# Patient Record
Sex: Male | Born: 1935 | Race: Black or African American | Hispanic: No | Smoking: Former smoker
Health system: Southern US, Community
[De-identification: ages and names within clinical notes are randomized; demographics above are authoritative.]

## PROBLEM LIST (undated history)

## (undated) DIAGNOSIS — H9192 Unspecified hearing loss, left ear: Secondary | ICD-10-CM

## (undated) DIAGNOSIS — E7800 Pure hypercholesterolemia, unspecified: Secondary | ICD-10-CM

## (undated) DIAGNOSIS — I739 Peripheral vascular disease, unspecified: Secondary | ICD-10-CM

## (undated) DIAGNOSIS — N182 Chronic kidney disease, stage 2 (mild): Secondary | ICD-10-CM

## (undated) DIAGNOSIS — C61 Malignant neoplasm of prostate: Secondary | ICD-10-CM

## (undated) DIAGNOSIS — I10 Essential (primary) hypertension: Secondary | ICD-10-CM

---

## 1989-04-30 HISTORY — PX: TRANSURETHRAL RESECTION OF PROSTATE: SHX73

## 1997-11-29 ENCOUNTER — Encounter: Admission: RE | Admit: 1997-11-29 | Discharge: 1998-02-27 | Payer: Self-pay | Admitting: Radiation Oncology

## 1998-03-11 ENCOUNTER — Ambulatory Visit (HOSPITAL_COMMUNITY): Admission: RE | Admit: 1998-03-11 | Discharge: 1998-03-11 | Payer: Self-pay | Admitting: Urology

## 1998-04-01 ENCOUNTER — Ambulatory Visit (HOSPITAL_COMMUNITY): Admission: RE | Admit: 1998-04-01 | Discharge: 1998-04-01 | Payer: Self-pay | Admitting: Radiation Oncology

## 1998-04-16 ENCOUNTER — Encounter: Admission: RE | Admit: 1998-04-16 | Discharge: 1998-07-15 | Payer: Self-pay | Admitting: Radiation Oncology

## 1999-05-06 ENCOUNTER — Ambulatory Visit (HOSPITAL_COMMUNITY): Admission: RE | Admit: 1999-05-06 | Discharge: 1999-05-06 | Payer: Self-pay | Admitting: Gastroenterology

## 1999-10-19 ENCOUNTER — Other Ambulatory Visit: Admission: RE | Admit: 1999-10-19 | Discharge: 1999-10-19 | Payer: Self-pay | Admitting: Urology

## 2000-06-17 ENCOUNTER — Ambulatory Visit (HOSPITAL_COMMUNITY): Admission: RE | Admit: 2000-06-17 | Discharge: 2000-06-17 | Payer: Self-pay | Admitting: Gastroenterology

## 2015-01-29 HISTORY — PX: MULTIPLE TOOTH EXTRACTIONS: SHX2053

## 2015-03-27 ENCOUNTER — Encounter (HOSPITAL_COMMUNITY): Payer: Self-pay | Admitting: Emergency Medicine

## 2015-03-27 ENCOUNTER — Inpatient Hospital Stay (HOSPITAL_COMMUNITY)
Admission: EM | Admit: 2015-03-27 | Discharge: 2015-04-04 | DRG: 374 | Disposition: A | Discharge status: Skilled Nursing Facility | Payer: Medicare Other | Attending: Internal Medicine | Admitting: Internal Medicine

## 2015-03-27 ENCOUNTER — Emergency Department (HOSPITAL_COMMUNITY): Payer: Medicare Other

## 2015-03-27 DIAGNOSIS — C169 Malignant neoplasm of stomach, unspecified: Secondary | ICD-10-CM | POA: Diagnosis not present

## 2015-03-27 DIAGNOSIS — N289 Disorder of kidney and ureter, unspecified: Secondary | ICD-10-CM | POA: Diagnosis not present

## 2015-03-27 DIAGNOSIS — R0602 Shortness of breath: Secondary | ICD-10-CM

## 2015-03-27 DIAGNOSIS — Z79899 Other long term (current) drug therapy: Secondary | ICD-10-CM | POA: Diagnosis not present

## 2015-03-27 DIAGNOSIS — R6251 Failure to thrive (child): Secondary | ICD-10-CM | POA: Diagnosis present

## 2015-03-27 DIAGNOSIS — I739 Peripheral vascular disease, unspecified: Secondary | ICD-10-CM | POA: Diagnosis present

## 2015-03-27 DIAGNOSIS — R410 Disorientation, unspecified: Secondary | ICD-10-CM

## 2015-03-27 DIAGNOSIS — I959 Hypotension, unspecified: Secondary | ICD-10-CM | POA: Diagnosis present

## 2015-03-27 DIAGNOSIS — F1010 Alcohol abuse, uncomplicated: Secondary | ICD-10-CM | POA: Diagnosis present

## 2015-03-27 DIAGNOSIS — Z87891 Personal history of nicotine dependence: Secondary | ICD-10-CM

## 2015-03-27 DIAGNOSIS — E785 Hyperlipidemia, unspecified: Secondary | ICD-10-CM | POA: Insufficient documentation

## 2015-03-27 DIAGNOSIS — D630 Anemia in neoplastic disease: Secondary | ICD-10-CM | POA: Diagnosis not present

## 2015-03-27 DIAGNOSIS — E440 Moderate protein-calorie malnutrition: Secondary | ICD-10-CM | POA: Diagnosis present

## 2015-03-27 DIAGNOSIS — E872 Acidosis, unspecified: Secondary | ICD-10-CM | POA: Diagnosis present

## 2015-03-27 DIAGNOSIS — K922 Gastrointestinal hemorrhage, unspecified: Secondary | ICD-10-CM | POA: Diagnosis not present

## 2015-03-27 DIAGNOSIS — E669 Obesity, unspecified: Secondary | ICD-10-CM | POA: Insufficient documentation

## 2015-03-27 DIAGNOSIS — C160 Malignant neoplasm of cardia: Secondary | ICD-10-CM | POA: Diagnosis present

## 2015-03-27 DIAGNOSIS — N183 Chronic kidney disease, stage 3 (moderate): Secondary | ICD-10-CM | POA: Diagnosis present

## 2015-03-27 DIAGNOSIS — E1151 Type 2 diabetes mellitus with diabetic peripheral angiopathy without gangrene: Secondary | ICD-10-CM | POA: Insufficient documentation

## 2015-03-27 DIAGNOSIS — D500 Iron deficiency anemia secondary to blood loss (chronic): Secondary | ICD-10-CM | POA: Diagnosis not present

## 2015-03-27 DIAGNOSIS — I9589 Other hypotension: Secondary | ICD-10-CM | POA: Diagnosis not present

## 2015-03-27 DIAGNOSIS — I129 Hypertensive chronic kidney disease with stage 1 through stage 4 chronic kidney disease, or unspecified chronic kidney disease: Secondary | ICD-10-CM | POA: Diagnosis present

## 2015-03-27 DIAGNOSIS — R627 Adult failure to thrive: Secondary | ICD-10-CM | POA: Diagnosis present

## 2015-03-27 DIAGNOSIS — G9341 Metabolic encephalopathy: Secondary | ICD-10-CM | POA: Diagnosis present

## 2015-03-27 DIAGNOSIS — M79609 Pain in unspecified limb: Secondary | ICD-10-CM | POA: Diagnosis not present

## 2015-03-27 DIAGNOSIS — D649 Anemia, unspecified: Secondary | ICD-10-CM | POA: Diagnosis not present

## 2015-03-27 DIAGNOSIS — N179 Acute kidney failure, unspecified: Secondary | ICD-10-CM | POA: Diagnosis present

## 2015-03-27 DIAGNOSIS — Z510 Encounter for antineoplastic radiation therapy: Secondary | ICD-10-CM | POA: Diagnosis present

## 2015-03-27 DIAGNOSIS — R7881 Bacteremia: Secondary | ICD-10-CM | POA: Diagnosis not present

## 2015-03-27 DIAGNOSIS — Z6826 Body mass index (BMI) 26.0-26.9, adult: Secondary | ICD-10-CM | POA: Diagnosis not present

## 2015-03-27 DIAGNOSIS — D62 Acute posthemorrhagic anemia: Secondary | ICD-10-CM | POA: Diagnosis present

## 2015-03-27 DIAGNOSIS — N182 Chronic kidney disease, stage 2 (mild): Secondary | ICD-10-CM | POA: Insufficient documentation

## 2015-03-27 HISTORY — DX: Pure hypercholesterolemia, unspecified: E78.00

## 2015-03-27 HISTORY — DX: Essential (primary) hypertension: I10

## 2015-03-27 HISTORY — DX: Chronic kidney disease, stage 2 (mild): N18.2

## 2015-03-27 HISTORY — DX: Unspecified hearing loss, left ear: H91.92

## 2015-03-27 HISTORY — DX: Peripheral vascular disease, unspecified: I73.9

## 2015-03-27 HISTORY — DX: Malignant neoplasm of prostate: C61

## 2015-03-27 LAB — CBC WITH DIFFERENTIAL/PLATELET
Basophils Absolute: 0 10*3/uL (ref 0.0–0.1)
Basophils Relative: 0 % (ref 0–1)
Eosinophils Absolute: 0 10*3/uL (ref 0.0–0.7)
Eosinophils Relative: 0 % (ref 0–5)
HCT: 27.3 % — ABNORMAL LOW (ref 39.0–52.0)
Hemoglobin: 8.8 g/dL — ABNORMAL LOW (ref 13.0–17.0)
LYMPHS PCT: 14 % (ref 12–46)
Lymphs Abs: 1 10*3/uL (ref 0.7–4.0)
MCH: 29 pg (ref 26.0–34.0)
MCHC: 32.2 g/dL (ref 30.0–36.0)
MCV: 90.1 fL (ref 78.0–100.0)
MONO ABS: 0.5 10*3/uL (ref 0.1–1.0)
Monocytes Relative: 6 % (ref 3–12)
NEUTROS ABS: 6 10*3/uL (ref 1.7–7.7)
Neutrophils Relative %: 80 % — ABNORMAL HIGH (ref 43–77)
PLATELETS: 360 10*3/uL (ref 150–400)
RBC: 3.03 MIL/uL — AB (ref 4.22–5.81)
RDW: 14.2 % (ref 11.5–15.5)
WBC: 7.5 10*3/uL (ref 4.0–10.5)

## 2015-03-27 LAB — LIPASE, BLOOD: LIPASE: 71 U/L — AB (ref 22–51)

## 2015-03-27 LAB — COMPREHENSIVE METABOLIC PANEL
ALBUMIN: 3 g/dL — AB (ref 3.5–5.0)
ALT: 10 U/L — ABNORMAL LOW (ref 17–63)
AST: 19 U/L (ref 15–41)
Alkaline Phosphatase: 52 U/L (ref 38–126)
Anion gap: 9 (ref 5–15)
BUN: 55 mg/dL — ABNORMAL HIGH (ref 6–20)
CALCIUM: 9.3 mg/dL (ref 8.9–10.3)
CO2: 19 mmol/L — AB (ref 22–32)
Chloride: 110 mmol/L (ref 101–111)
Creatinine, Ser: 3.12 mg/dL — ABNORMAL HIGH (ref 0.61–1.24)
GFR, EST AFRICAN AMERICAN: 20 mL/min — AB (ref >60)
GFR, EST NON AFRICAN AMERICAN: 17 mL/min — AB (ref >60)
Glucose, Bld: 127 mg/dL — ABNORMAL HIGH (ref 65–99)
POTASSIUM: 5.1 mmol/L (ref 3.5–5.1)
Sodium: 138 mmol/L (ref 135–145)
Total Bilirubin: 0.5 mg/dL (ref 0.3–1.2)
Total Protein: 5.7 g/dL — ABNORMAL LOW (ref 6.5–8.1)

## 2015-03-27 LAB — I-STAT CHEM 8, ED
BUN: 56 mg/dL — ABNORMAL HIGH (ref 6–20)
CHLORIDE: 107 mmol/L (ref 101–111)
CREATININE: 3 mg/dL — AB (ref 0.61–1.24)
Calcium, Ion: 1.18 mmol/L (ref 1.13–1.30)
GLUCOSE: 124 mg/dL — AB (ref 65–99)
HCT: 29 % — ABNORMAL LOW (ref 39.0–52.0)
HEMOGLOBIN: 9.9 g/dL — AB (ref 13.0–17.0)
Potassium: 4.9 mmol/L (ref 3.5–5.1)
SODIUM: 137 mmol/L (ref 135–145)
TCO2: 16 mmol/L (ref 0–100)

## 2015-03-27 LAB — PROTIME-INR
INR: 1.02 (ref 0.00–1.49)
PROTHROMBIN TIME: 13.6 s (ref 11.6–15.2)

## 2015-03-27 LAB — ABO/RH: ABO/RH(D): B POS

## 2015-03-27 LAB — I-STAT CG4 LACTIC ACID, ED: Lactic Acid, Venous: 2.53 mmol/L (ref 0.5–2.0)

## 2015-03-27 LAB — LACTIC ACID, PLASMA
LACTIC ACID, VENOUS: 1.1 mmol/L (ref 0.5–2.0)
LACTIC ACID, VENOUS: 2 mmol/L (ref 0.5–2.0)

## 2015-03-27 LAB — I-STAT TROPONIN, ED: Troponin i, poc: 0.02 ng/mL (ref 0.00–0.08)

## 2015-03-27 MED ORDER — ASPIRIN 300 MG RE SUPP
300.0000 mg | RECTAL | Status: AC
  Filled 2015-03-27: qty 1

## 2015-03-27 MED ORDER — LORAZEPAM 1 MG PO TABS: 1.0000 mg | ORAL_TABLET | ORAL | Status: DC | PRN

## 2015-03-27 MED ORDER — ENSURE ENLIVE PO LIQD
237.0000 mL | Freq: Two times a day (BID) | ORAL | Status: DC
  Administered 2015-03-27 – 2015-04-03 (×12): 237 mL via ORAL

## 2015-03-27 MED ORDER — ATORVASTATIN CALCIUM 20 MG PO TABS
20.0000 mg | ORAL_TABLET | Freq: Every day | ORAL | Status: DC
  Administered 2015-03-28 – 2015-04-03 (×7): 20 mg via ORAL
  Filled 2015-03-27 (×7): qty 1

## 2015-03-27 MED ORDER — FESOTERODINE FUMARATE ER 4 MG PO TB24
4.0000 mg | ORAL_TABLET | Freq: Every day | ORAL | Status: DC
  Administered 2015-03-28 – 2015-04-04 (×8): 4 mg via ORAL
  Filled 2015-03-27 (×9): qty 1

## 2015-03-27 MED ORDER — ENOXAPARIN SODIUM 30 MG/0.3ML ~~LOC~~ SOLN
30.0000 mg | SUBCUTANEOUS | Status: DC
  Administered 2015-03-27: 30 mg via SUBCUTANEOUS
  Filled 2015-03-27: qty 0.3

## 2015-03-27 MED ORDER — CHLORHEXIDINE GLUCONATE 0.12 % MT SOLN
15.0000 mL | Freq: Two times a day (BID) | OROMUCOSAL | Status: DC
  Administered 2015-03-27 – 2015-04-04 (×16): 15 mL via OROMUCOSAL
  Filled 2015-03-27 (×17): qty 15

## 2015-03-27 MED ORDER — LORAZEPAM 2 MG/ML IJ SOLN: 1.0000 mg | INTRAMUSCULAR | Status: DC | PRN

## 2015-03-27 MED ORDER — SODIUM CHLORIDE 0.9 % IV SOLN
INTRAVENOUS | Status: AC
  Administered 2015-03-27: 19:00:00 via INTRAVENOUS

## 2015-03-27 MED ORDER — SODIUM CHLORIDE 0.9 % IV SOLN: 250.0000 mL | INTRAVENOUS | Status: DC | PRN

## 2015-03-27 MED ORDER — ONDANSETRON HCL 4 MG/2ML IJ SOLN: 4.0000 mg | Freq: Four times a day (QID) | INTRAMUSCULAR | Status: DC | PRN

## 2015-03-27 MED ORDER — LORAZEPAM 2 MG/ML IJ SOLN: 2.0000 mg | INTRAMUSCULAR | Status: DC | PRN

## 2015-03-27 MED ORDER — CETYLPYRIDINIUM CHLORIDE 0.05 % MT LIQD
7.0000 mL | Freq: Two times a day (BID) | OROMUCOSAL | Status: DC
  Administered 2015-03-28 – 2015-04-03 (×10): 7 mL via OROMUCOSAL

## 2015-03-27 MED ORDER — SODIUM CHLORIDE 0.9 % IV BOLUS (SEPSIS)
1000.0000 mL | Freq: Once | INTRAVENOUS | Status: AC
  Administered 2015-03-27: 1000 mL via INTRAVENOUS

## 2015-03-27 MED ORDER — ASPIRIN 81 MG PO CHEW
324.0000 mg | CHEWABLE_TABLET | ORAL | Status: AC
  Administered 2015-03-27: 324 mg via ORAL
  Filled 2015-03-27: qty 4

## 2015-03-27 MED ORDER — ACETAMINOPHEN 325 MG PO TABS: 650.0000 mg | ORAL_TABLET | ORAL | Status: DC | PRN

## 2015-03-27 NOTE — H&P (Signed)
History and Physical  Benjamin Lawson Benjamin Lawson DOB: [birth year] DOA: 03/27/2015  Referring physician: Dr Colvin Caroli, ED physician PCP: Dr Bevelyn Buckles  Chief Complaint: Hypotension, failure to thrive  HPI: Benjamin Lawson is a 81 y.o. male  With a history of hypertension, hyperlipidemia, alcohol abuse, and diabetes with peripheral vascular disorder, stage II chronic kidney disease. The patient was sent to the emergency department from his doctor's office due to severe hypotension. The patient has had minimal oral intake of both food and liquid or the past 2-3 weeks due to his lower teeth being extracted. He was instructed to not take his blood pressure medication by his daughter, who is a retired Marine scientist, however the patient continue taking his blood pressure medication. He does admit to having lightheadedness and dizziness, which is worse with standing and ambulating. His lightheadedness and dizziness is improved with rest. He denies syncopal episodes. His symptoms are getting worse. His daughter and son reports mild intermittent confusion.  His doctor did send his last blood work, which took place about one year ago, which showed a creatinine of 1.4 and a hemoglobin of 12.6 and hematocrit of 37.4.  Review of Systems:   Pt complains of diminished appetite.  Pt denies any fevers, chills, nausea, vomiting, diarrhea, constipation, abdominal pain, shortness of breath, chest pain, palpitations, melena, rectal bleeding, headache, blurred vision.  Review of systems are otherwise negative  Past Medical History  Diagnosis Date  . Hypertension   . Hypercholesterolemia    History reviewed. No pertinent past surgical history. Social History:  reports that he has quit smoking. He does not have any smokeless tobacco history on file. He reports that he drinks alcohol. He reports that he does not use illicit drugs. Patient lives at home & is able to participate in activities of daily living    No Known Allergies  Family history of diabetes and hypertension  Prior to Admission medications   Medication Sig Start Date End Date Taking? Authorizing Provider  atorvastatin (LIPITOR) 20 MG tablet Take 20 mg by mouth daily.   Yes Historical Provider, MD  lactose free nutrition (BOOST) LIQD Take 237 mLs by mouth 2 (two) times daily with a meal.   Yes Historical Provider, MD  metoprolol succinate (TOPROL-XL) 50 MG 24 hr tablet Take 50 mg by mouth daily. Take with or immediately following a meal.   Yes Historical Provider, MD  sennosides-docusate sodium (SENOKOT-S) 8.6-50 MG tablet Take 1 tablet by mouth daily.   Yes Historical Provider, MD  telmisartan-hydrochlorothiazide (MICARDIS HCT) 80-12.5 MG per tablet Take 1 tablet by mouth daily.   Yes Historical Provider, MD  tolterodine (DETROL LA) 4 MG 24 hr capsule Take 4 mg by mouth daily.   Yes Historical Provider, MD    Physical Exam: BP 90/38 mmHg  Resp 18  General: Elderly black male. Awake and alert and oriented x3. No acute cardiopulmonary distress.  Eyes: Pupils equal, round, reactive to light. Extraocular muscles are intact. Sclerae anicteric and noninjected.  ENT:  Dry mucosal membranes. No mucosal lesions. Edentulous lower mouth.  Neck: Neck supple without lymphadenopathy. No carotid bruits. No masses palpated.  Cardiovascular: Regular rate with normal S1-S2 sounds. No murmurs, rubs, gallops auscultated. No JVD.  Respiratory: Good respiratory effort with no wheezes, rales, rhonchi. Lungs clear to auscultation bilaterally.  Abdomen: Soft, nontender, nondistended. Active bowel sounds. No masses or hepatosplenomegaly  Skin: Dry, warm to touch. 2+ dorsalis pedis and radial pulses. Severe tenting of the skin, very dry appearing  Musculoskeletal: No calf or leg pain. All major joints not erythematous nontender.  Psychiatric: Intact judgment and insight.  Neurologic: No focal neurological deficits. Cranial nerves II through XII are  grossly intact.           Labs on Admission:  Basic Metabolic Panel:  Recent Labs Lab 03/27/15 1432 03/27/15 1442  NA 137 138  K 4.9 5.1  CL 107 110  CO2  --  19*  GLUCOSE 124* 127*  BUN 56* 55*  CREATININE 3.00* 3.12*  CALCIUM  --  9.3   Liver Function Tests:  Recent Labs Lab 03/27/15 1442  AST 19  ALT 10*  ALKPHOS 52  BILITOT 0.5  PROT 5.7*  ALBUMIN 3.0*    Recent Labs Lab 03/27/15 1442  LIPASE 71*   No results for input(s): AMMONIA in the last 168 hours. CBC:  Recent Labs Lab 03/27/15 1432 03/27/15 1442  WBC  --  7.5  NEUTROABS  --  6.0  HGB 9.9* 8.8*  HCT 29.0* 27.3*  MCV  --  90.1  PLT  --  360   Cardiac Enzymes: No results for input(s): CKTOTAL, CKMB, CKMBINDEX, TROPONINI in the last 168 hours.  BNP (last 3 results) No results for input(s): BNP in the last 8760 hours.  ProBNP (last 3 results) No results for input(s): PROBNP in the last 8760 hours.  CBG: No results for input(s): GLUCAP in the last 168 hours.  Radiological Exams on Admission: Dg Chest Port 1 View  03/27/2015   CLINICAL DATA:  Weakness, fatigue, shortness of breath for 2 days. Ex-smoker  EXAM: PORTABLE CHEST - 1 VIEW  COMPARISON:  None.  FINDINGS: There is marked elevation of the left hemidiaphragm, of uncertain acuity. Lungs appear to be at least mildly hyperexpanded suggesting some degree of COPD. Lungs appear clear. No evidence of pneumonia. No pleural effusion. No pneumothorax.  Osseous structures are unremarkable. Cardiomediastinal silhouette appears normal in size and configuration, although a portion of the left heart border is obscured by the left hemidiaphragm elevation.  IMPRESSION: Lungs appear hyperexpanded suggesting some degree of underlying COPD.  Lungs are clear.  No evidence of acute cardiopulmonary abnormality.  Marked elevation of the left hemidiaphragm, of uncertain acuity but probably chronic.   Electronically Signed   By: Franki Cabot M.D.   On: 03/27/2015  14:55    EKG: Independently reviewed. Sinus tachycardia with ventricular rate of 124.  Abdominal PR and QRS intervals. Slight J-point depression in leads 23 and aVF. No acute ST changes. Borderline prolonged QTC  Assessment/Plan Present on Admission:  . Hypotension . Acute renal failure . Lactic acidosis . Anemia . Alcohol abuse . Failure to thrive in adult  This patient was discussed with the ED physician, including pertinent vitals, physical exam findings, labs, and imaging.  We also discussed care given by the ED provider.  #1 hypotension secondary to failure to thrive  Admit to stepdown due to continued hypotension  Will continue IV fluids and attempt to prevent use of pressors  Continue normal saline at 125 mL per hour  Hold antihypertensives #2 acute renal failure  Continue hydration and fluid resuscitation  Assess creatinine in the morning #3 lactic acidosis   Recheck lactic acid level  Continue hydration #4 anemia  This appears new over the past year  Check stool for occult blood and #5 alcohol abuse  Alcohol withdrawal protocol #6 failure to thrive and adult  Encourage by mouth intake #7 hypertension  Hold antihypertensives  DVT prophylaxis:  Lovenox  Consultants: None  Code Status: Full code  Family Communication: Son in the room, discussed with daughter on the phone   Disposition Plan: Admit to stepdown. Likely return home after improvement of hypotension and renal failure   Truett Mainland, DO [facility] Pager [phone]

## 2015-03-27 NOTE — ED Provider Notes (Signed)
CSN: [id number]     Arrival date & time 03/27/15  1332 History   First MD Initiated Contact with Patient 03/27/15 1416     Chief Complaint  Patient presents with  . Hypotension     (Consider location/radiation/quality/duration/timing/severity/associated sxs/prior Treatment) HPI Patient with 2-3 weeks of decreased oral intake and variable symptoms of mild confusion. The patient reports that he has not been eating because he had to have dental work and thus has not been taking much in. He reports soon he will be getting his lower denture plates and will be able to eat again. His daughter reports that she told him not to take his blood pressure medication with as much weight loss and decreased oral intake as he has been having, she does report however he continued to take it and took it today. The patient denies he's been experiencing any pain. He does not endorse chest pain headache or abdominal pain. He denies vomiting or diarrhea. His daughter reports that he lives independently. She does see him and notes that he has been mildly confused periodically. Confusion consistent with something such as mixing up names of foods or other recall\recognition type of cognition. Patient denies that he's been having any black or bloody stool Past Medical History  Diagnosis Date  . Hypertension   . Hypercholesterolemia    History reviewed. No pertinent past surgical history. No family history on file. History  Substance Use Topics  . Smoking status: Former Research scientist (life sciences)  . Smokeless tobacco: Not on file  . Alcohol Use: Yes    Review of Systems 10 Systems reviewed and are negative for acute change except as noted in the HPI.    Allergies  Review of patient's allergies indicates no known allergies.  Home Medications   Prior to Admission medications   Medication Sig Start Date End Date Taking? Authorizing Provider  atorvastatin (LIPITOR) 20 MG tablet Take 20 mg by mouth daily.   Yes Historical Provider,  MD  lactose free nutrition (BOOST) LIQD Take 237 mLs by mouth 2 (two) times daily with a meal.   Yes Historical Provider, MD  metoprolol succinate (TOPROL-XL) 50 MG 24 hr tablet Take 50 mg by mouth daily. Take with or immediately following a meal.   Yes Historical Provider, MD  sennosides-docusate sodium (SENOKOT-S) 8.6-50 MG tablet Take 1 tablet by mouth daily.   Yes Historical Provider, MD  telmisartan-hydrochlorothiazide (MICARDIS HCT) 80-12.5 MG per tablet Take 1 tablet by mouth daily.   Yes Historical Provider, MD  tolterodine (DETROL LA) 4 MG 24 hr capsule Take 4 mg by mouth daily.   Yes Historical Provider, MD   BP 90/38 mmHg  Resp 18 Physical Exam  Constitutional: He is oriented to person, place, and time. He appears well-developed and well-nourished.  Patient is slender and pale appearing. He is alert with no respiratory distress and clear mental status.  HENT:  Head: Normocephalic and atraumatic.  Right Ear: External ear normal.  Left Ear: External ear normal.  Mouth/Throat: Oropharynx is clear and moist.  Eyes: EOM are normal. Pupils are equal, round, and reactive to light.  Neck: Neck supple.  Cardiovascular: Normal rate, regular rhythm, normal heart sounds and intact distal pulses.   Pulmonary/Chest: Effort normal and breath sounds normal.  Abdominal: Soft. Bowel sounds are normal. He exhibits no distension. There is no tenderness.  Genitourinary:  Soft brown stool in the vault. No melanotic.  Musculoskeletal: Normal range of motion. He exhibits no edema or tenderness.  Neurological: He is  alert and oriented to person, place, and time. He has normal strength. Coordination normal. GCS eye subscore is 4. GCS verbal subscore is 5. GCS motor subscore is 6.  Skin: Skin is warm, dry and intact. There is pallor.  Psychiatric: He has a normal mood and affect.    ED Course  Procedures (including critical care time) Labs Review Labs Reviewed  COMPREHENSIVE METABOLIC PANEL -  Abnormal; Notable for the following:    CO2 19 (*)    Glucose, Bld 127 (*)    BUN 55 (*)    Creatinine, Ser 3.12 (*)    Total Protein 5.7 (*)    Albumin 3.0 (*)    ALT 10 (*)    GFR calc non Af Amer 17 (*)    GFR calc Af Amer 20 (*)    All other components within normal limits  LIPASE, BLOOD - Abnormal; Notable for the following:    Lipase 71 (*)    All other components within normal limits  CBC WITH DIFFERENTIAL/PLATELET - Abnormal; Notable for the following:    RBC 3.03 (*)    Hemoglobin 8.8 (*)    HCT 27.3 (*)    Neutrophils Relative % 80 (*)    All other components within normal limits  I-STAT CHEM 8, ED - Abnormal; Notable for the following:    BUN 56 (*)    Creatinine, Ser 3.00 (*)    Glucose, Bld 124 (*)    Hemoglobin 9.9 (*)    HCT 29.0 (*)    All other components within normal limits  I-STAT CG4 LACTIC ACID, ED - Abnormal; Notable for the following:    Lactic Acid, Venous 2.53 (*)    All other components within normal limits  CULTURE, BLOOD (ROUTINE X 2)  CULTURE, BLOOD (ROUTINE X 2)  PROTIME-INR  URINALYSIS, ROUTINE W REFLEX MICROSCOPIC (NOT AT [facility])  I-STAT TROPOININ, ED  I-STAT TROPOININ, ED  POC OCCULT BLOOD, ED  TYPE AND SCREEN  ABO/RH    Imaging Review Dg Chest Port 1 View  03/27/2015   CLINICAL DATA:  Weakness, fatigue, shortness of breath for 2 days. Ex-smoker  EXAM: PORTABLE CHEST - 1 VIEW  COMPARISON:  None.  FINDINGS: There is marked elevation of the left hemidiaphragm, of uncertain acuity. Lungs appear to be at least mildly hyperexpanded suggesting some degree of COPD. Lungs appear clear. No evidence of pneumonia. No pleural effusion. No pneumothorax.  Osseous structures are unremarkable. Cardiomediastinal silhouette appears normal in size and configuration, although a portion of the left heart border is obscured by the left hemidiaphragm elevation.  IMPRESSION: Lungs appear hyperexpanded suggesting some degree of underlying COPD.  Lungs are clear.  No  evidence of acute cardiopulmonary abnormality.  Marked elevation of the left hemidiaphragm, of uncertain acuity but probably chronic.   Electronically Signed   By: Franki Cabot M.D.   On: 03/27/2015 14:55     EKG Interpretation   Date/Time:  Thursday March 27 2015 14:10:18 EDT Ventricular Rate:  124 PR Interval:  102 QRS Duration: 86 QT Interval:  336 QTC Calculation: 483 R Axis:   -41 Text Interpretation:  Sinus tachycardia Low voltage, extremity leads  Consider RVH or posterior infarct ST depr, consider ischemia, inferior  leads Borderline prolonged QT interval agree. Confirmed by Johnney Killian, MD,  Jeannie Done [phone]) on 03/27/2015 3:30:36 PM     CRITICAL CARE Performed by: Charlesetta Shanks   Total critical care time: 45  Critical care time was exclusive of separately billable procedures and  treating other patients.  Critical care was necessary to treat or prevent imminent or life-threatening deterioration.  Critical care was time spent personally by me on the following activities: development of treatment plan with patient and/or surrogate as well as nursing, discussions with consultants, evaluation of patient's response to treatment, examination of patient, obtaining history from patient or surrogate, ordering and performing treatments and interventions, ordering and review of laboratory studies, ordering and review of radiographic studies, pulse oximetry and re-evaluation of patient's condition. MDM   Final diagnoses:  Hypotension, unspecified hypotension type  Acute kidney injury  Failure to thrive (0-17)   Patient arrives initially hypotension with acute renal failure. This appears to be due to decreased oral intake. The patient was hypotensive and near syncopal. Mental status is clear. Respiratory status is stable. At this time the patient will be admitted for ongoing treatment of hypotension with renal insufficiency. He does not appear to be septic at this time. Patient is anemic  however does not appear to have acute active bleeding with stool that is brown and non-melanotic.    Charlesetta Shanks, MD 03/27/15 [phone]

## 2015-03-27 NOTE — ED Notes (Signed)
Pt./dsughter stated, hes been dizzy and not eating, and hes not eating.  I brought him because of low BP.

## 2015-03-28 ENCOUNTER — Encounter (HOSPITAL_COMMUNITY)
Admission: EM | Disposition: A | Discharge status: Skilled Nursing Facility | Payer: Self-pay | Source: Home / Self Care | Attending: Internal Medicine

## 2015-03-28 ENCOUNTER — Encounter (HOSPITAL_COMMUNITY): Payer: Self-pay

## 2015-03-28 DIAGNOSIS — N179 Acute kidney failure, unspecified: Secondary | ICD-10-CM

## 2015-03-28 DIAGNOSIS — E440 Moderate protein-calorie malnutrition: Secondary | ICD-10-CM | POA: Insufficient documentation

## 2015-03-28 DIAGNOSIS — D62 Acute posthemorrhagic anemia: Secondary | ICD-10-CM

## 2015-03-28 DIAGNOSIS — R7881 Bacteremia: Secondary | ICD-10-CM

## 2015-03-28 HISTORY — PX: ESOPHAGOGASTRODUODENOSCOPY: SHX5428

## 2015-03-28 LAB — PROTIME-INR
INR: 1.08 (ref 0.00–1.49)
PROTHROMBIN TIME: 14.2 s (ref 11.6–15.2)

## 2015-03-28 LAB — CBC
HEMATOCRIT: 21.9 % — AB (ref 39.0–52.0)
HEMATOCRIT: 23.5 % — AB (ref 39.0–52.0)
HEMOGLOBIN: 7 g/dL — AB (ref 13.0–17.0)
Hemoglobin: 7.4 g/dL — ABNORMAL LOW (ref 13.0–17.0)
MCH: 28.8 pg (ref 26.0–34.0)
MCH: 28.8 pg (ref 26.0–34.0)
MCHC: 32 g/dL (ref 30.0–36.0)
MCHC: 31.5 g/dL (ref 30.0–36.0)
MCV: 90.1 fL (ref 78.0–100.0)
MCV: 91.4 fL (ref 78.0–100.0)
PLATELETS: 275 10*3/uL (ref 150–400)
Platelets: 273 10*3/uL (ref 150–400)
RBC: 2.43 MIL/uL — ABNORMAL LOW (ref 4.22–5.81)
RBC: 2.57 MIL/uL — ABNORMAL LOW (ref 4.22–5.81)
RDW: 14.3 % (ref 11.5–15.5)
RDW: 14.5 % (ref 11.5–15.5)
WBC: 6.7 10*3/uL (ref 4.0–10.5)
WBC: 7.8 10*3/uL (ref 4.0–10.5)

## 2015-03-28 LAB — HEMOGLOBIN AND HEMATOCRIT, BLOOD
HCT: 29.6 % — ABNORMAL LOW (ref 39.0–52.0)
HEMATOCRIT: 32 % — AB (ref 39.0–52.0)
HEMOGLOBIN: 10.7 g/dL — AB (ref 13.0–17.0)
Hemoglobin: 9.9 g/dL — ABNORMAL LOW (ref 13.0–17.0)

## 2015-03-28 LAB — BASIC METABOLIC PANEL
ANION GAP: 8 (ref 5–15)
Anion gap: 5 (ref 5–15)
BUN: 49 mg/dL — ABNORMAL HIGH (ref 6–20)
BUN: 50 mg/dL — AB (ref 6–20)
CALCIUM: 8.2 mg/dL — AB (ref 8.9–10.3)
CALCIUM: 8.2 mg/dL — AB (ref 8.9–10.3)
CHLORIDE: 114 mmol/L — AB (ref 101–111)
CO2: 21 mmol/L — AB (ref 22–32)
CO2: 18 mmol/L — ABNORMAL LOW (ref 22–32)
Chloride: 115 mmol/L — ABNORMAL HIGH (ref 101–111)
Creatinine, Ser: 2.29 mg/dL — ABNORMAL HIGH (ref 0.61–1.24)
Creatinine, Ser: 2.32 mg/dL — ABNORMAL HIGH (ref 0.61–1.24)
GFR calc Af Amer: 29 mL/min — ABNORMAL LOW (ref >60)
GFR calc Af Amer: 29 mL/min — ABNORMAL LOW (ref >60)
GFR calc non Af Amer: 25 mL/min — ABNORMAL LOW (ref >60)
GFR calc non Af Amer: 25 mL/min — ABNORMAL LOW (ref >60)
GLUCOSE: 99 mg/dL (ref 65–99)
Glucose, Bld: 95 mg/dL (ref 65–99)
Potassium: 4.6 mmol/L (ref 3.5–5.1)
Potassium: 4.6 mmol/L (ref 3.5–5.1)
SODIUM: 141 mmol/L (ref 135–145)
SODIUM: 140 mmol/L (ref 135–145)

## 2015-03-28 LAB — PREPARE RBC (CROSSMATCH)

## 2015-03-28 LAB — OCCULT BLOOD X 1 CARD TO LAB, STOOL: FECAL OCCULT BLD: POSITIVE — AB

## 2015-03-28 LAB — APTT: APTT: 26 s (ref 24–37)

## 2015-03-28 SURGERY — EGD (ESOPHAGOGASTRODUODENOSCOPY)
Anesthesia: Moderate Sedation

## 2015-03-28 MED ORDER — SODIUM CHLORIDE 0.9 % IV SOLN
8.0000 mg/h | INTRAVENOUS | Status: AC
  Administered 2015-03-28 – 2015-03-30 (×6): 8 mg/h via INTRAVENOUS
  Filled 2015-03-28 (×13): qty 80

## 2015-03-28 MED ORDER — VANCOMYCIN HCL IN DEXTROSE 750-5 MG/150ML-% IV SOLN
750.0000 mg | INTRAVENOUS | Status: DC
  Administered 2015-03-28 – 2015-03-29 (×2): 750 mg via INTRAVENOUS
  Filled 2015-03-28 (×3): qty 150

## 2015-03-28 MED ORDER — SODIUM CHLORIDE 0.9 % IV SOLN
80.0000 mg | Freq: Once | INTRAVENOUS | Status: AC
  Administered 2015-03-28: 80 mg via INTRAVENOUS
  Filled 2015-03-28: qty 80

## 2015-03-28 MED ORDER — DIPHENHYDRAMINE HCL 50 MG/ML IJ SOLN
INTRAMUSCULAR | Status: DC | PRN
  Administered 2015-03-28 (×2): 12.5 mg via INTRAVENOUS

## 2015-03-28 MED ORDER — SODIUM CHLORIDE 0.9 % IV SOLN
INTRAVENOUS | Status: DC
  Administered 2015-03-28: 06:00:00 via INTRAVENOUS
  Administered 2015-03-28: 150 mL/h via INTRAVENOUS
  Administered 2015-03-29: 14:00:00 via INTRAVENOUS
  Administered 2015-03-29: 150 mL/h via INTRAVENOUS
  Administered 2015-03-29 (×2): via INTRAVENOUS
  Administered 2015-03-30: 75 mL/h via INTRAVENOUS
  Administered 2015-03-31 – 2015-04-01 (×3): via INTRAVENOUS

## 2015-03-28 MED ORDER — FENTANYL CITRATE (PF) 100 MCG/2ML IJ SOLN
INTRAMUSCULAR | Status: AC
  Filled 2015-03-28: qty 2

## 2015-03-28 MED ORDER — MIDAZOLAM HCL 5 MG/ML IJ SOLN
INTRAMUSCULAR | Status: AC
  Filled 2015-03-28: qty 2

## 2015-03-28 MED ORDER — DIPHENHYDRAMINE HCL 50 MG/ML IJ SOLN
25.0000 mg | Freq: Once | INTRAMUSCULAR | Status: AC
  Administered 2015-03-28: 25 mg via INTRAVENOUS
  Filled 2015-03-28: qty 1

## 2015-03-28 MED ORDER — FENTANYL CITRATE (PF) 100 MCG/2ML IJ SOLN
INTRAMUSCULAR | Status: DC | PRN
  Administered 2015-03-28: 12.5 ug via INTRAVENOUS

## 2015-03-28 MED ORDER — ACETAMINOPHEN 325 MG PO TABS
650.0000 mg | ORAL_TABLET | Freq: Once | ORAL | Status: AC
  Administered 2015-03-28: 650 mg via ORAL
  Filled 2015-03-28: qty 2

## 2015-03-28 MED ORDER — DIPHENHYDRAMINE HCL 50 MG/ML IJ SOLN
INTRAMUSCULAR | Status: AC
  Filled 2015-03-28: qty 1

## 2015-03-28 MED ORDER — BUTAMBEN-TETRACAINE-BENZOCAINE 2-2-14 % EX AERO
INHALATION_SPRAY | CUTANEOUS | Status: DC | PRN
  Administered 2015-03-28: 2 via TOPICAL

## 2015-03-28 MED ORDER — SODIUM CHLORIDE 0.9 % IV SOLN: INTRAVENOUS | Status: DC

## 2015-03-28 MED ORDER — SODIUM CHLORIDE 0.9 % IV SOLN: Freq: Once | INTRAVENOUS | Status: AC

## 2015-03-28 MED ORDER — MIDAZOLAM HCL 10 MG/2ML IJ SOLN
INTRAMUSCULAR | Status: DC | PRN
  Administered 2015-03-28: 2 mg via INTRAVENOUS

## 2015-03-28 MED ORDER — SODIUM CHLORIDE 0.9 % IV SOLN
Freq: Once | INTRAVENOUS | Status: AC
  Administered 2015-03-28: 10 mL/h via INTRAVENOUS

## 2015-03-28 MED ORDER — PANTOPRAZOLE SODIUM 40 MG IV SOLR
40.0000 mg | Freq: Two times a day (BID) | INTRAVENOUS | Status: DC
  Administered 2015-03-28 – 2015-03-30 (×4): 40 mg via INTRAVENOUS
  Filled 2015-03-28 (×4): qty 40

## 2015-03-28 NOTE — Op Note (Signed)
[facility] [place], [address]   ENDOSCOPY PROCEDURE REPORT  PATIENT: Benjamin Lawson, Benjamin Lawson  MR#: [id number] BIRTHDATE: 1932-12-17 , 82  yrs. old GENDER: male ENDOSCOPIST: Richmond Campbell, MD REFERRED BY: PROCEDURE DATE:  April 04, 2015 PROCEDURE:  EGD w/ biopsy ASA CLASS:     Class II INDICATIONS:  melena. MEDICATIONS: Benadryl 25 mg IV, 12.5, Midazolam (Versed) 2 mg, and Versed 2 mg IV TOPICAL ANESTHETIC:  DESCRIPTION OF PROCEDURE: After the risks benefits and alternatives of the procedure were thoroughly explained, informed consent was obtained.  The Pentax Gastroscope [id number] endoscope was introduced through the mouth and advanced to the second portion of the duodenum , Without limitations.  The instrument was slowly withdrawn as the mucosa was fully examined. Estimated blood loss is zero unless otherwise noted in this procedure report.      ESOPHAGUS: The mucosa of the esophagus appeared normal.  STOMACH: A near circumferential firm and ulcerated mass with friable surfaces was found in the cardia.  Retroflexed views revealed as previously described.  Biopsies were obtained.  The stomach could be visualized to the antrum but advancement beyond this could not be accomplished.  The scope was then withdrawn from the patient and the procedure completed.  COMPLICATIONS: There were no immediate complications.  ENDOSCOPIC IMPRESSION: 1.   The mucosa of the esophagus appeared normal 2.   Near circumferential mass was found in the cardia 3.    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 [name] is the probable cause of his UGI bleed causing anemia and melena.  RECOMMENDATIONS: 1) Abdominal CT to further evaluate gastric lesion. 2) Follow-up pathology 3) Supportive measures 4) Oncology consultation.  REPEAT EXAM:  eSignedRichmond Campbell, MD 2015/04/04 4:01 PM    CC:  CPT CODES:     81856 Upper  gastrointestinal endoscopy including esophagus, stomach, and either the duodenum and/or jejunum as appropriate; with biopsy, single or multiple ICD CODES:     K92.1 Melena D13.1 Benign neoplasm of stomach  The ICD and CPT codes recommended by this software are interpretations from the data that the clinical staff has captured with the software.  The verification of the translation of this report to the ICD and CPT codes and modifiers is the sole responsibility of the health care institution and practicing physician where this report was generated.  [employer] will not be held responsible for the validity of the ICD and CPT codes included on this report.  AMA assumes no liability for data contained or not contained herein. CPT is a Designer, television/film set of the [employer].  PATIENT NAME:  Jaylin, Benzel MR#: [id number]

## 2015-03-28 NOTE — Progress Notes (Signed)
PATIENT DETAILS Name: Benjamin Lawson Age: 81 y.o. Sex: male Date of Birth: 1933/07/26 Admit Date: 03/27/2015 Admitting Physician Truett Mainland, DO ZJQ:[id number],KWIOXB G, MD  Subjective: Feels better-had large melanotic stools overnight. Denies prior melanoma to me.  Assessment/Plan: Active Problems: Hypotension: Likely secondary to hypovolemia from blood loss and continued use of anti-hypertensive medications. Blood pressure much better following IV fluid resuscitation and transfusion of 2 units of PRBC. Continue to hold antihypertensives. Follow closely.  Acute blood loss anemia: Secondary to Suspected upper GI bleed. Transfuse 2 units of PRBC,  follow hemoglobin closely.  Suspected upper GI bleed: Presented with hypotension, worsening anemia and frank melanotic stools 1 post admission. Started on PPI, GI consulted-for EGD later today. Follow closely.  Acute renal failure: Likely prerenal azotemia in a setting of acute blood loss and continued antihypertensives use (Micardis HCTZ). Hydrate, hold antihypertensives, follow closely. Strict intake and output, if no further improvement-will check renal ultrasound and if necessary obtain a nephrology evaluation  Gram-positive bacteremia: Did have recent dental extraction-repeat blood culture, start vancomycin-follow cultures.  History of hypertension: Continue to hold all antihypertensives as blood pressure still soft  Known severe malnutrition: Will start supplements once diet advanced.  ? EtOH use: denies significant EtOH use to me-monitor for withdrawals.  Disposition: Remain inpatient  Antimicrobial agents  See below  Anti-infectives    None      DVT Prophylaxis: SCD's  Code Status: Full code   Family Communication None at bedside  Procedures: None  CONSULTS:  GI  Time spent 40 minutes-Greater than 50% of this time was spent in counseling, explanation of diagnosis, planning of further  management, and coordination of care.  MEDICATIONS: Scheduled Meds: . [facility] Hold] antiseptic oral rinse  7 mL Mouth Rinse q12n4p  . [MAR Hold] atorvastatin  20 mg Oral q1800  . [MAR Hold] chlorhexidine  15 mL Mouth Rinse BID  . [MAR Hold] feeding supplement (ENSURE ENLIVE)  237 mL Oral BID BM  . [MAR Hold] fesoterodine  4 mg Oral Daily  . [MAR Hold] pantoprazole (PROTONIX) IV  40 mg Intravenous Q12H   Continuous Infusions: . sodium chloride 150 mL/hr (03/28/15 1311)  . sodium chloride    . pantoprozole (PROTONIX) infusion 8 mg/hr (03/28/15 1052)   PRN Meds:.[MAR Hold] acetaminophen, butamben-tetracaine-benzocaine, diphenhydrAMINE, fentaNYL, [MAR Hold] LORazepam, midazolam, [MAR Hold] ondansetron (ZOFRAN) IV    PHYSICAL EXAM: Vital signs in last 24 hours: Filed Vitals:   03/28/15 1530 03/28/15 1535 03/28/15 1537 03/28/15 1540  BP: 97/24 88/57  90/26  Pulse: 98 93  92  Temp:      TempSrc:      Resp: 19 22  22   Height:      Weight:      SpO2: 100% 100% 100% 100%    Weight change:  Filed Weights   03/27/15 1759 03/28/15 0555  Weight: 77.7 kg (171 lb 4.8 oz) 78.9 kg (173 lb 15.1 oz)   Body mass index is 24.27 kg/(m^2).   Gen Exam: Awake and alert with clear speech.  He looks chronically ill looking and pale. Neck: Supple, No JVD.   Chest: B/L Clear.   CVS: S1 S2 Regular, no murmurs.  Abdomen: soft, BS +, non tender, non distended.  Extremities: no edema, lower extremities warm to touch. Neurologic: Non Focal.   Skin: No Rash.   Wounds: N/A.    Intake/Output from previous day:  Intake/Output Summary (  Last 24 hours) at 03/28/15 1540 Last data filed at 03/28/15 1215  Gross per 24 hour  Intake 3212.09 ml  Output      0 ml  Net 3212.09 ml     LAB RESULTS: CBC  Recent Labs Lab 03/27/15 1432 03/27/15 1442 03/28/15 0340 03/28/15 0427 03/28/15 1357  WBC  --  7.5 7.8 6.7  --   HGB 9.9* 8.8* 7.4* 7.0* 9.9*  HCT 29.0* 27.3* 23.5* 21.9* 29.6*  PLT  --  360 273  275  --   MCV  --  90.1 91.4 90.1  --   MCH  --  29.0 28.8 28.8  --   MCHC  --  32.2 31.5 32.0  --   RDW  --  14.2 14.5 14.3  --   LYMPHSABS  --  1.0  --   --   --   MONOABS  --  0.5  --   --   --   EOSABS  --  0.0  --   --   --   BASOSABS  --  0.0  --   --   --     Chemistries   Recent Labs Lab 03/27/15 1432 03/27/15 1442 03/28/15 0340 03/28/15 0427  NA 137 138 140 141  K 4.9 5.1 4.6 4.6  CL 107 110 114* 115*  CO2  --  19* 18* 21*  GLUCOSE 124* 127* 95 99  BUN 56* 55* 50* 49*  CREATININE 3.00* 3.12* 2.29* 2.32*  CALCIUM  --  9.3 8.2* 8.2*    CBG: No results for input(s): GLUCAP in the last 168 hours.  GFR Estimated Creatinine Clearance: 27 mL/min (by C-G formula based on Cr of 2.32).  Coagulation profile  Recent Labs Lab 03/27/15 1442 03/28/15 0427  INR 1.02 1.08    Cardiac Enzymes No results for input(s): CKMB, TROPONINI, MYOGLOBIN in the last 168 hours.  Invalid input(s): CK  Invalid input(s): POCBNP No results for input(s): DDIMER in the last 72 hours. No results for input(s): HGBA1C in the last 72 hours. No results for input(s): CHOL, HDL, LDLCALC, TRIG, CHOLHDL, LDLDIRECT in the last 72 hours. No results for input(s): TSH, T4TOTAL, T3FREE, THYROIDAB in the last 72 hours.  Invalid input(s): FREET3 No results for input(s): VITAMINB12, FOLATE, FERRITIN, TIBC, IRON, RETICCTPCT in the last 72 hours.  Recent Labs  03/27/15 1442  LIPASE 71*    Urine Studies No results for input(s): UHGB, CRYS in the last 72 hours.  Invalid input(s): UACOL, UAPR, USPG, UPH, UTP, UGL, UKET, UBIL, UNIT, UROB, ULEU, UEPI, UWBC, URBC, UBAC, CAST, UCOM, BILUA  MICROBIOLOGY: Recent Results (from the past 240 hour(s))  Culture, blood (routine x 2)     Status: None (Preliminary result)   Collection Time: 03/27/15  4:56 PM  Result Value Ref Range Status   Specimen Description BLOOD LEFT ARM  Final   Special Requests BOTTLES DRAWN AEROBIC AND ANAEROBIC 5CC  Final    Culture  Setup Time   Final    GRAM POSITIVE COCCI IN CLUSTERS AEROBIC BOTTLE ONLY CRITICAL RESULT CALLED TO, READ BACK BY AND VERIFIED WITH: J KOME 03/28/15 @ 43 M VESTAL    Culture GRAM POSITIVE COCCI  Final   Report Status PENDING  Incomplete    RADIOLOGY STUDIES/RESULTS: Dg Chest Port 1 View  03/27/2015   CLINICAL DATA:  Weakness, fatigue, shortness of breath for 2 days. Ex-smoker  EXAM: PORTABLE CHEST - 1 VIEW  COMPARISON:  None.  FINDINGS: There is  marked elevation of the left hemidiaphragm, of uncertain acuity. Lungs appear to be at least mildly hyperexpanded suggesting some degree of COPD. Lungs appear clear. No evidence of pneumonia. No pleural effusion. No pneumothorax.  Osseous structures are unremarkable. Cardiomediastinal silhouette appears normal in size and configuration, although a portion of the left heart border is obscured by the left hemidiaphragm elevation.  IMPRESSION: Lungs appear hyperexpanded suggesting some degree of underlying COPD.  Lungs are clear.  No evidence of acute cardiopulmonary abnormality.  Marked elevation of the left hemidiaphragm, of uncertain acuity but probably chronic.   Electronically Signed   By: Franki Cabot M.D.   On: 03/27/2015 14:55    Oren Binet, MD  [facility] Pager:[phone]  If 7PM-7AM, please contact night-coverage [email or web address] Password [email or web address] 03/28/2015, 3:40 PM   LOS: 1 day

## 2015-03-28 NOTE — Progress Notes (Signed)
AM labs reviewed. > 2gm drop in hemoglobin since admission yesterday. NP on-call for [facility] notified by text page.  Continuing to monitor.

## 2015-03-28 NOTE — Progress Notes (Signed)
   03/28/15 0353  Vitals  BP (!) 83/44 mmHg  MAP (mmHg) 54  ECG Heart Rate 92  Resp (!) 23   Vital signs above taken at 03:53 and repeated at 03:57 with similar results. Patient does not report any new symptoms, however he has been generally weak since prior to admission per self and family report. Alert, oriented to self, place and situation with memory deficits. Hemoccult was positive after large black, tarry bowel movement earlier this shift. Last hemoglobin/hematocrit 8.8/27.3 at 14:42 on 03/27/15. Type and screen done on admission.  Have previously contacted NP on-call for [facility] to make aware of patient status/most recent lab results. AM labs pending:  CBC and BMET.  Will page for any new findings. Continuing to monitor closely.

## 2015-03-28 NOTE — Consult Note (Cosign Needed)
Reason for Consult:  Melena, blood loss anemia Referring Physician: Hospitalist  Benjamin Lawson is an 81 y.o. male.  HPI: The patient is an 81 year old African-American male admitted with hypotension.  Subsequently found to have a drop in his hemogram with increased BUN:Cr ratio and passing melanotic stool.  Although he denies GI symptoms, his daughter who is a Marine scientist, has noted decreased appetite, weight loss.  She does not report dysphagia or odynophagia.  He does admit to 'nipping' at the alcohol.  No prior history of UGI pathology.  He has been seen by me in the past for  Screening colonoscopy remotely.  Past Medical History  Diagnosis Date  . Hypertension   . Hypercholesterolemia   . Chronic kidney disease (CKD), stage II (mild)   . Deafness in left ear   . Peripheral vascular disease   . Prostate cancer     Past Surgical History  Procedure Laterality Date  . Multiple tooth extractions  01/2015    "all my lower teeth"  . Transurethral resection of prostate  1990's    History reviewed. No pertinent family history.  Social History:  reports that he has quit smoking. His smoking use included Cigarettes. He has a 2.5 pack-year smoking history. He has never used smokeless tobacco. He reports that he drinks about 1.2 oz of alcohol per week. He reports that he does not use illicit drugs.  Allergies: No Known Allergies  Medications:   Results for orders placed or performed during the hospital encounter of 03/27/15 (from the past 48 hour(s))  I-Stat Troponin, ED (not at [facility])     Status: None   Collection Time: 03/27/15  2:30 PM  Result Value Ref Range   Troponin i, poc 0.02 0.00 - 0.08 ng/mL   Comment 3            Comment: Due to the release kinetics of cTnI, a negative result within the first hours of the onset of symptoms does not rule out myocardial infarction with certainty. If myocardial infarction is still suspected, repeat the test at appropriate intervals.   I-Stat  Chem 8, ED     Status: Abnormal   Collection Time: 03/27/15  2:32 PM  Result Value Ref Range   Sodium 137 135 - 145 mmol/L   Potassium 4.9 3.5 - 5.1 mmol/L   Chloride 107 101 - 111 mmol/L   BUN 56 (H) 6 - 20 mg/dL   Creatinine, Ser 3.00 (H) 0.61 - 1.24 mg/dL   Glucose, Bld 124 (H) 65 - 99 mg/dL   Calcium, Ion 1.18 1.13 - 1.30 mmol/L   TCO2 16 0 - 100 mmol/L   Hemoglobin 9.9 (L) 13.0 - 17.0 g/dL   HCT 29.0 (L) 39.0 - 52.0 %  Comprehensive metabolic panel     Status: Abnormal   Collection Time: 03/27/15  2:42 PM  Result Value Ref Range   Sodium 138 135 - 145 mmol/L   Potassium 5.1 3.5 - 5.1 mmol/L   Chloride 110 101 - 111 mmol/L   CO2 19 (L) 22 - 32 mmol/L   Glucose, Bld 127 (H) 65 - 99 mg/dL   BUN 55 (H) 6 - 20 mg/dL   Creatinine, Ser 3.12 (H) 0.61 - 1.24 mg/dL   Calcium 9.3 8.9 - 10.3 mg/dL   Total Protein 5.7 (L) 6.5 - 8.1 g/dL   Albumin 3.0 (L) 3.5 - 5.0 g/dL   AST 19 15 - 41 U/L   ALT 10 (L) 17 -  63 U/L   Alkaline Phosphatase 52 38 - 126 U/L   Total Bilirubin 0.5 0.3 - 1.2 mg/dL   GFR calc non Af Amer 17 (L) >60 mL/min   GFR calc Af Amer 20 (L) >60 mL/min    Comment: (NOTE) The eGFR has been calculated using the CKD EPI equation. This calculation has not been validated in all clinical situations. eGFR's persistently <60 mL/min signify possible Chronic Kidney Disease.    Anion gap 9 5 - 15  Lipase, blood     Status: Abnormal   Collection Time: 03/27/15  2:42 PM  Result Value Ref Range   Lipase 71 (H) 22 - 51 U/L  CBC with Differential     Status: Abnormal   Collection Time: 03/27/15  2:42 PM  Result Value Ref Range   WBC 7.5 4.0 - 10.5 K/uL   RBC 3.03 (L) 4.22 - 5.81 MIL/uL   Hemoglobin 8.8 (L) 13.0 - 17.0 g/dL   HCT 27.3 (L) 39.0 - 52.0 %   MCV 90.1 78.0 - 100.0 fL   MCH 29.0 26.0 - 34.0 pg   MCHC 32.2 30.0 - 36.0 g/dL   RDW 14.2 11.5 - 15.5 %   Platelets 360 150 - 400 K/uL   Neutrophils Relative % 80 (H) 43 - 77 %   Neutro Abs 6.0 1.7 - 7.7 K/uL    Lymphocytes Relative 14 12 - 46 %   Lymphs Abs 1.0 0.7 - 4.0 K/uL   Monocytes Relative 6 3 - 12 %   Monocytes Absolute 0.5 0.1 - 1.0 K/uL   Eosinophils Relative 0 0 - 5 %   Eosinophils Absolute 0.0 0.0 - 0.7 K/uL   Basophils Relative 0 0 - 1 %   Basophils Absolute 0.0 0.0 - 0.1 K/uL  Protime-INR     Status: None   Collection Time: 03/27/15  2:42 PM  Result Value Ref Range   Prothrombin Time 13.6 11.6 - 15.2 seconds   INR 1.02 0.00 - 1.49  I-Stat CG4 Lactic Acid, ED     Status: Abnormal   Collection Time: 03/27/15  2:42 PM  Result Value Ref Range   Lactic Acid, Venous 2.53 (HH) 0.5 - 2.0 mmol/L   Comment NOTIFIED PHYSICIAN   Type and screen     Status: None (Preliminary result)   Collection Time: 03/27/15  3:00 PM  Result Value Ref Range   ABO/RH(D) B POS    Antibody Screen NEG    Sample Expiration 03/30/2015    Unit Number [id number]    Blood Component Type RED CELLS,LR    Unit division 00    Status of Unit ISSUED    Transfusion Status OK TO TRANSFUSE    Crossmatch Result Compatible    Unit Number [id number]    Blood Component Type RED CELLS,LR    Unit division 00    Status of Unit ISSUED    Transfusion Status OK TO TRANSFUSE    Crossmatch Result Compatible   ABO/Rh     Status: None   Collection Time: 03/27/15  3:00 PM  Result Value Ref Range   ABO/RH(D) B POS   Culture, blood (routine x 2)     Status: None (Preliminary result)   Collection Time: 03/27/15  4:56 PM  Result Value Ref Range   Specimen Description BLOOD LEFT ARM    Special Requests BOTTLES DRAWN AEROBIC AND ANAEROBIC 5CC    Culture  Setup Time      GRAM POSITIVE COCCI IN CLUSTERS AEROBIC  BOTTLE ONLY CRITICAL RESULT CALLED TO, READ BACK BY AND VERIFIED WITH: J KOME 03/28/15 @ [place] COCCI    Report Status PENDING   Lactic acid, plasma     Status: None   Collection Time: 03/27/15  4:57 PM  Result Value Ref Range   Lactic Acid, Venous 1.1 0.5 - 2.0 mmol/L  Lactic  acid, plasma     Status: None   Collection Time: 03/27/15  7:57 PM  Result Value Ref Range   Lactic Acid, Venous 2.0 0.5 - 2.0 mmol/L  Occult blood card to lab, stool     Status: Abnormal   Collection Time: 03/28/15  2:00 AM  Result Value Ref Range   Fecal Occult Bld POSITIVE (A) NEGATIVE  CBC     Status: Abnormal   Collection Time: 03/28/15  3:40 AM  Result Value Ref Range   WBC 7.8 4.0 - 10.5 K/uL   RBC 2.57 (L) 4.22 - 5.81 MIL/uL   Hemoglobin 7.4 (L) 13.0 - 17.0 g/dL   HCT 23.5 (L) 39.0 - 52.0 %   MCV 91.4 78.0 - 100.0 fL   MCH 28.8 26.0 - 34.0 pg   MCHC 31.5 30.0 - 36.0 g/dL   RDW 14.5 11.5 - 15.5 %   Platelets 273 150 - 400 K/uL  Basic metabolic panel     Status: Abnormal   Collection Time: 03/28/15  3:40 AM  Result Value Ref Range   Sodium 140 135 - 145 mmol/L   Potassium 4.6 3.5 - 5.1 mmol/L   Chloride 114 (H) 101 - 111 mmol/L   CO2 18 (L) 22 - 32 mmol/L   Glucose, Bld 95 65 - 99 mg/dL   BUN 50 (H) 6 - 20 mg/dL   Creatinine, Ser 2.29 (H) 0.61 - 1.24 mg/dL   Calcium 8.2 (L) 8.9 - 10.3 mg/dL   GFR calc non Af Amer 25 (L) >60 mL/min   GFR calc Af Amer 29 (L) >60 mL/min    Comment: (NOTE) The eGFR has been calculated using the CKD EPI equation. This calculation has not been validated in all clinical situations. eGFR's persistently <60 mL/min signify possible Chronic Kidney Disease.    Anion gap 8 5 - 15  CBC     Status: Abnormal   Collection Time: 03/28/15  4:27 AM  Result Value Ref Range   WBC 6.7 4.0 - 10.5 K/uL   RBC 2.43 (L) 4.22 - 5.81 MIL/uL   Hemoglobin 7.0 (L) 13.0 - 17.0 g/dL   HCT 21.9 (L) 39.0 - 52.0 %   MCV 90.1 78.0 - 100.0 fL   MCH 28.8 26.0 - 34.0 pg   MCHC 32.0 30.0 - 36.0 g/dL   RDW 14.3 11.5 - 15.5 %   Platelets 275 150 - 400 K/uL  Protime-INR     Status: None   Collection Time: 03/28/15  4:27 AM  Result Value Ref Range   Prothrombin Time 14.2 11.6 - 15.2 seconds   INR 1.08 0.00 - 1.49  APTT     Status: None   Collection Time: 03/28/15   4:27 AM  Result Value Ref Range   aPTT 26 24 - 37 seconds  Basic metabolic panel     Status: Abnormal   Collection Time: 03/28/15  4:27 AM  Result Value Ref Range   Sodium 141 135 - 145 mmol/L   Potassium 4.6 3.5 - 5.1 mmol/L   Chloride 115 (H) 101 - 111 mmol/L  CO2 21 (L) 22 - 32 mmol/L   Glucose, Bld 99 65 - 99 mg/dL   BUN 49 (H) 6 - 20 mg/dL   Creatinine, Ser 2.32 (H) 0.61 - 1.24 mg/dL   Calcium 8.2 (L) 8.9 - 10.3 mg/dL   GFR calc non Af Amer 25 (L) >60 mL/min   GFR calc Af Amer 29 (L) >60 mL/min    Comment: (NOTE) The eGFR has been calculated using the CKD EPI equation. This calculation has not been validated in all clinical situations. eGFR's persistently <60 mL/min signify possible Chronic Kidney Disease.    Anion gap 5 5 - 15  Prepare RBC     Status: None   Collection Time: 03/28/15  5:45 AM  Result Value Ref Range   Order Confirmation ORDER PROCESSED BY BLOOD BANK   Prepare RBC     Status: None   Collection Time: 03/28/15  7:49 AM  Result Value Ref Range   Order Confirmation ORDER PROCESSED BY BLOOD BANK   Hemoglobin and hematocrit, blood     Status: Abnormal   Collection Time: 03/28/15  1:57 PM  Result Value Ref Range   Hemoglobin 9.9 (L) 13.0 - 17.0 g/dL    Comment: REPEATED TO VERIFY DELTA CHECK NOTED POST TRANSFUSION SPECIMEN    HCT 29.6 (L) 39.0 - 52.0 %    Dg Chest Port 1 View  03/27/2015   CLINICAL DATA:  Weakness, fatigue, shortness of breath for 2 days. Ex-smoker  EXAM: PORTABLE CHEST - 1 VIEW  COMPARISON:  None.  FINDINGS: There is marked elevation of the left hemidiaphragm, of uncertain acuity. Lungs appear to be at least mildly hyperexpanded suggesting some degree of COPD. Lungs appear clear. No evidence of pneumonia. No pleural effusion. No pneumothorax.  Osseous structures are unremarkable. Cardiomediastinal silhouette appears normal in size and configuration, although a portion of the left heart border is obscured by the left hemidiaphragm elevation.   IMPRESSION: Lungs appear hyperexpanded suggesting some degree of underlying COPD.  Lungs are clear.  No evidence of acute cardiopulmonary abnormality.  Marked elevation of the left hemidiaphragm, of uncertain acuity but probably chronic.   Electronically Signed   By: Franki Cabot M.D.   On: 03/27/2015 14:55    ROS Blood pressure 70/21, pulse 30, temperature 97.9 F (36.6 C), temperature source Oral, resp. rate 18, height _0  (1.803 m), weight 78.9 kg (173 lb 15.1 oz), SpO2 96 %. Physical Exam Healthy appearing, younger than stated age.  Alert and oriented x 3.  VSS. HEENT:  Anicteric sclerae, pale conjunctivae.  Edentulous with healed gums following lower teeth extraction. Neck:  Supple, no adenopathy, no bruit Chest:  Bilateral gynecomastia.  Clear auscultation. Cor:  RR, nl S1 And S2.  No murmur, rub. Abdomen:  Soft, no organomegaly.  Hepar edge not palpable.  No fluid.  Bowel sounds active throughout.  Non-tender, no mass. Extremeties:  Pale nail beds.  No clubbing, cyanosis, or edema. Neuro:  No focal deficits detected.  Mentation sharp.  Assessment/Plan: UGI bleed suspected.  Cannot rule out variceal hemorrhage with history of alcohol use, though physical findings suggesting chronic liver disease is limited to gynecomastia.  He    does not  Does not use NSAID's or ASA to suggest gstropathy.  Need to evaluate possible neoplasia with recent past history of anorexia, weight loss.  Plan: 1)  Supportive measures 2)  EGD  Ahman Dugdale R 03/28/2015, 4:25 PM

## 2015-03-28 NOTE — Progress Notes (Signed)
Orders received for STAT BMET, CBC, PTT, PT/INR, D/C Lovenox 30mg  SQ daily and place SCDs. Will page results to NP on-call.  Continuing to monitor.

## 2015-03-28 NOTE — Progress Notes (Signed)
Utilization review completed. Kajuana Shareef, RN, BSN. 

## 2015-03-28 NOTE — Progress Notes (Signed)
Initial Nutrition Assessment  DOCUMENTATION CODES:   Non-severe (moderate) malnutrition in context of chronic illness  INTERVENTION:   Once diet advances, provide Ensure Enlive po BID, each supplement provides 350 kcal and 20 grams of protein.  NUTRITION DIAGNOSIS:   Malnutrition related to chronic illness as evidenced by moderate depletions of muscle mass, moderate depletion of body fat.  GOAL:   Patient will meet greater than or equal to 90% of their needs  MONITOR:   Diet advancement, Weight trends, Labs, I & O's  REASON FOR ASSESSMENT:   Malnutrition Screening Tool    ASSESSMENT:   With a history of hypertension, hyperlipidemia, alcohol abuse, and diabetes with peripheral vascular disorder, stage II chronic kidney disease. The patient was sent to the emergency department from his doctor's office due to severe hypotension. The patient has had minimal oral intake of both food and liquid or the past 2-3 weeks due to his lower teeth being extracted.  Pt reports having a good appetite, however has been having difficulties eating due to no lower teeth. Pt says he will be getting his teeth next week.  He reports over the past 2-3 weeks, he has been mostly consuming soup and applesauce. Pt usually consumes 2 meals a day. Pt does additionally consume Boost BID at home. Pt is currently NPO however reports being hungry. Pt reports usual body weight of ~150 lbs. Pt currently has Ensure ordered. Will continue with orders. Provide once diet advances.   Nutrition-Focused physical exam completed. Findings are moderate fat depletion, moderate muscle depletion, and no edema.   Labs: Low CO2, calcium, and GFR. High chloride, BUN, and creatinine.   Diet Order:  Diet NPO time specified  Skin:  Reviewed, no issues  Last BM:  7/28  Height:   Ht Readings from Last 1 Encounters:  03/27/15 5\' 11"  (1.803 m)    Weight:   Wt Readings from Last 1 Encounters:  03/28/15 173 lb 15.1 oz (78.9  kg)    Ideal Body Weight:  78 kg  BMI:  Body mass index is 24.27 kg/(m^2).  Estimated Nutritional Needs:   Kcal:  1950-2150  Protein:  90-100 grams  Fluid:  Per MD  EDUCATION NEEDS:   No education needs identified at this time  Corrin Parker, MS, RD, LDN Pager # [phone] After hours/ weekend pager # [phone]

## 2015-03-28 NOTE — Progress Notes (Signed)
Shift event: RN, Gerald Stabs, paged this NP earlier in shift because pt had a large tarry stool. BP low (has been since admit) but pt asymptomatic. Stat CBC, PT/PTT/INR ordered. Coags normal but CBC showed further drop in hemoglobin to 7.0. Given hypotension and now with ? GIB, will transfuse one unit PRBCs and check serial H/Hs throughout day. Lovenox d/c'd and SCDs applied. Will report off to attending at 0700. Pt may need GI consult. Clance Boll, NP [facility]

## 2015-03-28 NOTE — Progress Notes (Signed)
CRITICAL VALUE ALERT  Critical value received:  Gram positive cocci  Date of notification:  03/28/2015  Time of notification:  12:57 Critical value read back:Yes.    Nurse who received alert:  Estanislado Pandy  MD notified (1st page):  Dr. Sloan Leiter Time of first page:  15:15  MD notified (2nd page):  Time of second page:  Responding MD: Dr. Sloan Leiter  Time MD responded:  15:20

## 2015-03-28 NOTE — Progress Notes (Signed)
ANTIBIOTIC CONSULT NOTE - INITIAL  Pharmacy Consult for Vancomycin Indication: GPC sepsis  No Known Allergies  Patient Measurements: Height: 5\' 11"  (180.3 cm) Weight: 173 lb 15.1 oz (78.9 kg) IBW/kg (Calculated) : 75.3  Labs:  Recent Labs  03/27/15 1442 03/28/15 0340 03/28/15 0427 03/28/15 1357  WBC 7.5 7.8 6.7  --   HGB 8.8* 7.4* 7.0* 9.9*  PLT 360 273 275  --   CREATININE 3.12* 2.29* 2.32*  --    Estimated Creatinine Clearance: 27 mL/min (by C-G formula based on Cr of 2.32). No results for input(s): VANCOTROUGH, VANCOPEAK, VANCORANDOM, GENTTROUGH, GENTPEAK, GENTRANDOM, TOBRATROUGH, TOBRAPEAK, TOBRARND, AMIKACINPEAK, AMIKACINTROU, AMIKACIN in the last 72 hours.   Microbiology: Recent Results (from the past 720 hour(s))  Culture, blood (routine x 2)     Status: None (Preliminary result)   Collection Time: 03/27/15  4:56 PM  Result Value Ref Range Status   Specimen Description BLOOD LEFT ARM  Final   Special Requests BOTTLES DRAWN AEROBIC AND ANAEROBIC 5CC  Final   Culture  Setup Time   Final    GRAM POSITIVE COCCI IN CLUSTERS AEROBIC BOTTLE ONLY CRITICAL RESULT CALLED TO, READ BACK BY AND VERIFIED WITH: J KOME 03/28/15 @ 71 M VESTAL    Culture GRAM POSITIVE COCCI  Final   Report Status PENDING  Incomplete    Medical History: Past Medical History  Diagnosis Date  . Hypertension   . Hypercholesterolemia   . Chronic kidney disease (CKD), stage II (mild)   . Deafness in left ear   . Peripheral vascular disease   . Prostate cancer     Assessment: 81 year old male to begin vancomycin for GPC in first set of blood cultures  Goal of Therapy:  Vancomycin trough level 15-20 mcg/ml  Plan:  Vancomycin 750 mg iv Q 24 hours to start after second set of blood cultures drawn. Follow up Scr, progress, cultures  Thank you. Anette Guarneri, PharmD [phone]  03/28/2015,5:10 PM

## 2015-03-29 DIAGNOSIS — I9589 Other hypotension: Secondary | ICD-10-CM

## 2015-03-29 LAB — BASIC METABOLIC PANEL
ANION GAP: 7 (ref 5–15)
BUN: 32 mg/dL — ABNORMAL HIGH (ref 6–20)
CO2: 17 mmol/L — AB (ref 22–32)
Calcium: 7.9 mg/dL — ABNORMAL LOW (ref 8.9–10.3)
Chloride: 118 mmol/L — ABNORMAL HIGH (ref 101–111)
Creatinine, Ser: 1.71 mg/dL — ABNORMAL HIGH (ref 0.61–1.24)
GFR calc Af Amer: 42 mL/min — ABNORMAL LOW (ref >60)
GFR calc non Af Amer: 36 mL/min — ABNORMAL LOW (ref >60)
GLUCOSE: 107 mg/dL — AB (ref 65–99)
Potassium: 4.1 mmol/L (ref 3.5–5.1)
SODIUM: 142 mmol/L (ref 135–145)

## 2015-03-29 LAB — TYPE AND SCREEN
ABO/RH(D): B POS
Antibody Screen: NEGATIVE
Unit division: 0
Unit division: 0

## 2015-03-29 LAB — HEMOGLOBIN AND HEMATOCRIT, BLOOD
HCT: 29.3 % — ABNORMAL LOW (ref 39.0–52.0)
Hemoglobin: 9.9 g/dL — ABNORMAL LOW (ref 13.0–17.0)

## 2015-03-29 NOTE — Progress Notes (Signed)
PATIENT DETAILS Name: Benjamin Lawson Age: 81 y.o. Sex: male Date of Birth: [birth year] Admit Date: 03/27/2015 Admitting Physician Truett Mainland, DO NLZ:[id number],XTKWIO G, MD  Subjective: Feels better-no melanoma overnight  Assessment/Plan: Active Problems: Hypotension: Likely secondary to hypovolemia from GI bleed and continued use of anti-hypertensive medications. Blood pressure much better following IV fluid resuscitation and transfusion of 2 units of PRBC. Continue to hold antihypertensives. Follow closely.  Acute blood loss anemia: Secondary to Suspected upper GI bleed. Transfuse 2 units of PRBC,  follow hemoglobin closely.  Upper GI bleed: Presented with hypotension, worsening anemia and frank melanotic stools 1 post admission. Started on PPI, GI consulted-underwent EGD on 7/29-findings concerning for gastric cancer. Thankfully no further bleeding overnight, hemoglobin and blood pressure significantly improved. We'll continue to monitor closely   Acute renal failure: Likely prerenal azotemia in a setting of acute blood loss and continued antihypertensives use (Micardis HCTZ). Creatinine significantly improved. Continue to Hydrate, hold antihypertensives, follow closely.   Suspected gastric cancer:seen on endoscopy. Await biopsy results. Once creatinine better-Will order CT chest and CT abdomen. Once biopsy results available, we'll obtain oncology consultation.  Gram-positive bacteremia: Not sure this is contamination-however did have recent dental extraction-continue vancomycin and await further culture results..  History of hypertension: Continue to hold all antihypertensives as blood pressure still soft but more improved  Known severe malnutrition: Will start supplements once diet advanced.  ? EtOH use: denies significant EtOH use to me-monitor for withdrawals.  Disposition: Remain inpatient  Antimicrobial agents  See below  Anti-infectives    Start      Dose/Rate Route Frequency Ordered Stop   03/28/15 2200  vancomycin (VANCOCIN) IVPB 750 mg/150 ml premix     750 mg 150 mL/hr over 60 Minutes Intravenous Every 24 hours 03/28/15 2114        DVT Prophylaxis: SCD's  Code Status: Full code   Family Communication Daughter at bedside  Procedures: None  CONSULTS:  GI  Time spent 30 minutes-Greater than 50% of this time was spent in counseling, explanation of diagnosis, planning of further management, and coordination of care.  MEDICATIONS: Scheduled Meds: . antiseptic oral rinse  7 mL Mouth Rinse q12n4p  . atorvastatin  20 mg Oral q1800  . chlorhexidine  15 mL Mouth Rinse BID  . feeding supplement (ENSURE ENLIVE)  237 mL Oral BID BM  . fesoterodine  4 mg Oral Daily  . pantoprazole (PROTONIX) IV  40 mg Intravenous Q12H  . vancomycin  750 mg Intravenous Q24H   Continuous Infusions: . sodium chloride 150 mL/hr at 03/29/15 1407  . pantoprozole (PROTONIX) infusion 8 mg/hr (03/28/15 2358)   PRN Meds:.acetaminophen, LORazepam, ondansetron (ZOFRAN) IV    PHYSICAL EXAM: Vital signs in last 24 hours: Filed Vitals:   03/29/15 0017 03/29/15 0615 03/29/15 1009 03/29/15 1156  BP:   115/65 121/86  Pulse:   103 112  Temp: 98.8 F (37.1 C) 98.3 F (36.8 C) 97.6 F (36.4 C) 98.1 F (36.7 C)  TempSrc: Oral Oral Axillary Oral  Resp:   29 20  Height:      Weight:  84.8 kg (186 lb 15.2 oz)    SpO2:        Weight change: 7.1 kg (15 lb 10.4 oz) Filed Weights   03/27/15 1759 03/28/15 0555 03/29/15 0615  Weight: 77.7 kg (171 lb 4.8 oz) 78.9 kg (173 lb 15.1 oz) 84.8 kg (186 lb  15.2 oz)   Body mass index is 26.09 kg/(m^2).   Gen Exam: Awake and alert with clear speech.  He looks chronically ill looking and pale. Neck: Supple, No JVD.   Chest: B/L Clear.   CVS: S1 S2 Regular, no murmurs.  Abdomen: soft, BS +, non tender, non distended.  Extremities: no edema, lower extremities warm to touch. Neurologic: Non Focal.   Skin:  No Rash.   Wounds: N/A.    Intake/Output from previous day:  Intake/Output Summary (Last 24 hours) at 03/29/15 1408 Last data filed at 03/29/15 1012  Gross per 24 hour  Intake   2290 ml  Output    350 ml  Net   1940 ml     LAB RESULTS: CBC  Recent Labs Lab 03/27/15 1442 03/28/15 0340 03/28/15 0427 03/28/15 1357 03/28/15 2257 03/29/15 0652  WBC 7.5 7.8 6.7  --   --   --   HGB 8.8* 7.4* 7.0* 9.9* 10.7* 9.9*  HCT 27.3* 23.5* 21.9* 29.6* 32.0* 29.3*  PLT 360 273 275  --   --   --   MCV 90.1 91.4 90.1  --   --   --   MCH 29.0 28.8 28.8  --   --   --   MCHC 32.2 31.5 32.0  --   --   --   RDW 14.2 14.5 14.3  --   --   --   LYMPHSABS 1.0  --   --   --   --   --   MONOABS 0.5  --   --   --   --   --   EOSABS 0.0  --   --   --   --   --   BASOSABS 0.0  --   --   --   --   --     Chemistries   Recent Labs Lab 03/27/15 1432 03/27/15 1442 03/28/15 0340 03/28/15 0427 03/29/15 0827  NA 137 138 140 141 142  K 4.9 5.1 4.6 4.6 4.1  CL 107 110 114* 115* 118*  CO2  --  19* 18* 21* 17*  GLUCOSE 124* 127* 95 99 107*  BUN 56* 55* 50* 49* 32*  CREATININE 3.00* 3.12* 2.29* 2.32* 1.71*  CALCIUM  --  9.3 8.2* 8.2* 7.9*    CBG: No results for input(s): GLUCAP in the last 168 hours.  GFR Estimated Creatinine Clearance: 36.7 mL/min (by C-G formula based on Cr of 1.71).  Coagulation profile  Recent Labs Lab 03/27/15 1442 03/28/15 0427  INR 1.02 1.08    Cardiac Enzymes No results for input(s): CKMB, TROPONINI, MYOGLOBIN in the last 168 hours.  Invalid input(s): CK  Invalid input(s): POCBNP No results for input(s): DDIMER in the last 72 hours. No results for input(s): HGBA1C in the last 72 hours. No results for input(s): CHOL, HDL, LDLCALC, TRIG, CHOLHDL, LDLDIRECT in the last 72 hours. No results for input(s): TSH, T4TOTAL, T3FREE, THYROIDAB in the last 72 hours.  Invalid input(s): FREET3 No results for input(s): VITAMINB12, FOLATE, FERRITIN, TIBC, IRON,  RETICCTPCT in the last 72 hours.  Recent Labs  03/27/15 1442  LIPASE 71*    Urine Studies No results for input(s): UHGB, CRYS in the last 72 hours.  Invalid input(s): UACOL, UAPR, USPG, UPH, UTP, UGL, UKET, UBIL, UNIT, UROB, ULEU, UEPI, UWBC, URBC, UBAC, CAST, UCOM, BILUA  MICROBIOLOGY: Recent Results (from the past 240 hour(s))  Culture, blood (routine x 2)     Status: None (  Preliminary result)   Collection Time: 03/27/15  4:56 PM  Result Value Ref Range Status   Specimen Description BLOOD LEFT ARM  Final   Special Requests BOTTLES DRAWN AEROBIC AND ANAEROBIC 5CC  Final   Culture  Setup Time   Final    GRAM POSITIVE COCCI IN CLUSTERS AEROBIC BOTTLE ONLY CRITICAL RESULT CALLED TO, READ BACK BY AND VERIFIED WITH: J KOME 03/28/15 @ 80 M VESTAL    Culture GRAM POSITIVE COCCI  Final   Report Status PENDING  Incomplete  Culture, blood (routine x 2)     Status: None (Preliminary result)   Collection Time: 03/28/15  8:06 PM  Result Value Ref Range Status   Specimen Description BLOOD RIGHT HAND  Final   Special Requests BOTTLES DRAWN AEROBIC ONLY 5CC  Final   Culture NO GROWTH < 24 HOURS  Final   Report Status PENDING  Incomplete  Culture, blood (routine x 2)     Status: None (Preliminary result)   Collection Time: 03/28/15  8:37 PM  Result Value Ref Range Status   Specimen Description BLOOD RIGHT HAND  Final   Special Requests BOTTLES DRAWN AEROBIC ONLY 5CC  Final   Culture NO GROWTH < 24 HOURS  Final   Report Status PENDING  Incomplete    RADIOLOGY STUDIES/RESULTS: Dg Chest Port 1 View  03/27/2015   CLINICAL DATA:  Weakness, fatigue, shortness of breath for 2 days. Ex-smoker  EXAM: PORTABLE CHEST - 1 VIEW  COMPARISON:  None.  FINDINGS: There is marked elevation of the left hemidiaphragm, of uncertain acuity. Lungs appear to be at least mildly hyperexpanded suggesting some degree of COPD. Lungs appear clear. No evidence of pneumonia. No pleural effusion. No pneumothorax.   Osseous structures are unremarkable. Cardiomediastinal silhouette appears normal in size and configuration, although a portion of the left heart border is obscured by the left hemidiaphragm elevation.  IMPRESSION: Lungs appear hyperexpanded suggesting some degree of underlying COPD.  Lungs are clear.  No evidence of acute cardiopulmonary abnormality.  Marked elevation of the left hemidiaphragm, of uncertain acuity but probably chronic.   Electronically Signed   By: Franki Cabot M.D.   On: 03/27/2015 14:55    Oren Binet, MD  [facility] Pager:[phone]  If 7PM-7AM, please contact night-coverage [email or web address] Password [email or web address] 03/29/2015, 2:08 PM   LOS: 2 days

## 2015-03-30 LAB — CBC
HEMATOCRIT: 30 % — AB (ref 39.0–52.0)
HEMOGLOBIN: 10 g/dL — AB (ref 13.0–17.0)
MCH: 29.4 pg (ref 26.0–34.0)
MCHC: 33.3 g/dL (ref 30.0–36.0)
MCV: 88.2 fL (ref 78.0–100.0)
Platelets: 230 10*3/uL (ref 150–400)
RBC: 3.4 MIL/uL — ABNORMAL LOW (ref 4.22–5.81)
RDW: 15.7 % — ABNORMAL HIGH (ref 11.5–15.5)
WBC: 9.7 10*3/uL (ref 4.0–10.5)

## 2015-03-30 LAB — CULTURE, BLOOD (ROUTINE X 2)

## 2015-03-30 LAB — BASIC METABOLIC PANEL
Anion gap: 3 — ABNORMAL LOW (ref 5–15)
BUN: 25 mg/dL — ABNORMAL HIGH (ref 6–20)
CHLORIDE: 123 mmol/L — AB (ref 101–111)
CO2: 17 mmol/L — AB (ref 22–32)
Calcium: 7.7 mg/dL — ABNORMAL LOW (ref 8.9–10.3)
Creatinine, Ser: 1.5 mg/dL — ABNORMAL HIGH (ref 0.61–1.24)
GFR calc Af Amer: 49 mL/min — ABNORMAL LOW (ref >60)
GFR calc non Af Amer: 42 mL/min — ABNORMAL LOW (ref >60)
Glucose, Bld: 104 mg/dL — ABNORMAL HIGH (ref 65–99)
POTASSIUM: 4.6 mmol/L (ref 3.5–5.1)
SODIUM: 143 mmol/L (ref 135–145)

## 2015-03-30 MED ORDER — PANTOPRAZOLE SODIUM 40 MG IV SOLR
40.0000 mg | Freq: Two times a day (BID) | INTRAVENOUS | Status: DC
  Administered 2015-03-31 – 2015-04-01 (×3): 40 mg via INTRAVENOUS
  Filled 2015-03-30 (×3): qty 40

## 2015-03-30 NOTE — Evaluation (Signed)
Physical Therapy Evaluation Patient Details Name: Benjamin Lawson MRN: [id number] DOB: 08-14-1933 Today's Date: 03/30/2015   History of Present Illness  81 yo male With a history of hypertension, hyperlipidemia, alcohol abuse, and diabetes with peripheral vascular disorder, stage II chronic kidney disease. The patient was sent to the emergency department from his doctor's office due to severe hypotension; Noted UGIB, and findings on endoscopy concerning for gastric Ca; workup continues  Clinical Impression   Pt admitted with above diagnosis. Pt currently with functional limitations due to the deficits listed below (see PT Problem List).  Pt will benefit from skilled PT to increase their independence and safety with mobility to allow discharge to the venue listed below.       Follow Up Recommendations Home health PT;Other (comment) (If slow progress, consider SNF to maximize independence and safety with mobility as he lives alone)    Equipment Recommendations  Other (comment) (to be determined)    Recommendations for Other Services OT consult     Precautions / Restrictions Precautions Precautions: Fall      Mobility  Bed Mobility Overal bed mobility: Needs Assistance Bed Mobility: Supine to Sit     Supine to sit: Min assist     General bed mobility comments: Min handheld assist to pull to sit; used bed pad to square off hips at EOB  Transfers Overall transfer level: Needs assistance Equipment used: 1 person hand held assist Transfers: Sit to/from [facility] Sit to Stand: Mod assist Stand pivot transfers: Mod assist       General transfer comment: Mod assist to power up to stand; good LE muscle recruitment -- no need to block knees; cues and steady assist for pivotal step sbed to chair; cues also to self-monitor due to tachycardia  Ambulation/Gait             General Gait Details: just pivot steps bed to chair  Stairs             Wheelchair Mobility    Modified Rankin (Stroke Patients Only)       Balance Overall balance assessment: Needs assistance           Standing balance-Leahy Scale: Poor Standing balance comment: Approaching Fair -- definitely benefits from UE support while being upright                             Pertinent Vitals/Pain Pain Assessment: No/denies pain    Home Living Family/patient expects to be discharged to:: Private residence Living Arrangements: Alone Available Help at Discharge: Family;Available PRN/intermittently (adult children live on same street) Type of Home: House Home Access: Level entry     Home Layout: One level Home Equipment: None      Prior Function Level of Independence: Independent               Hand Dominance        Extremity/Trunk Assessment   Upper Extremity Assessment: Generalized weakness           Lower Extremity Assessment: Generalized weakness         Communication   Communication: No difficulties  Cognition Arousal/Alertness: Awake/alert Behavior During Therapy: WFL for tasks assessed/performed Overall Cognitive Status: Within Functional Limits for tasks assessed                      General Comments General comments (skin integrity, edema, etc.): Tachycardic during session; HR ranged 118-130;  BP 113/60 sitting EOB    Exercises        Assessment/Plan    PT Assessment Patient needs continued PT services  PT Diagnosis Difficulty walking;Generalized weakness   PT Problem List Decreased strength;Decreased activity tolerance;Decreased balance;Decreased mobility;Decreased knowledge of use of DME;Decreased knowledge of precautions  PT Treatment Interventions DME instruction;Gait training;Stair training;Functional mobility training;Therapeutic activities;Therapeutic exercise;Patient/family education   PT Goals (Current goals can be found in the Care Plan section) Acute Rehab PT Goals Patient  Stated Goal: did not state; but he seemed glad to be OOB today PT Goal Formulation: With patient Time For Goal Achievement: 04/13/15 Potential to Achieve Goals: Good    Frequency Min 3X/week   Barriers to discharge Decreased caregiver support lives alone    Co-evaluation               End of Session Equipment Utilized During Treatment: Gait belt Activity Tolerance: Patient tolerated treatment well Patient left: in chair;with call bell/phone within reach Nurse Communication: Mobility status         Time: 2706-2376 PT Time Calculation (min) (ACUTE ONLY): 23 min   Charges:   PT Evaluation $Initial PT Evaluation Tier I: 1 Procedure PT Treatments $Therapeutic Activity: 8-22 mins   PT G Codes:        Quin Hoop 03/30/2015, 1:24 PM  Roney [place], [place] Pager [phone] Office [phone]

## 2015-03-30 NOTE — Progress Notes (Signed)
PATIENT DETAILS Name: Benjamin Lawson Age: 81 y.o. Sex: male Date of Birth: Sep 30, 1932 Admit Date: 03/27/2015 Admitting Physician Truett Mainland, DO DDU:[id number] G, MD  Subjective: Feels better-no melena.  Assessment/Plan: Active Problems: Hypotension: Likely secondary to hypovolemia from GI bleed and continued use of anti-hypertensive medications. Blood pressure much better following IV fluid resuscitation and transfusion of 2 units of PRBC. Continue to hold antihypertensives. Follow closely.  Acute blood loss anemia: Secondary to Suspected upper GI bleed. Transfused 2 units of PRBC-following which Hb remains stable.Follow hemoglobin closely.  Upper GI bleed: Presented with hypotension, worsening anemia and frank melanotic stools 1 post admission. Started on PPI, GI consulted-underwent EGD on 7/29-findings concerning for gastric cancer. Thankfully no further bleeding, hemoglobin and blood pressure significantly improved. Will continue to monitor closely   Acute renal failure: Likely prerenal azotemia in a setting of acute blood loss and continued antihypertensives use (Micardis HCTZ). Creatinine significantly improved. Continue to Hydrate, hold antihypertensives, follow closely.   Suspected gastric cancer:seen on endoscopy. Await biopsy results. Once creatinine further improves-will order CT chest and CT abdomen. Once biopsy results available, will obtain oncology consultation.  Coag neg staph bacteremia: Likely contamination-repeat blood culture negative. Discontinue vancomycin-and monitor off Abx  History of hypertension: Continue to hold all antihypertensives as blood pressure still soft but much improved  Known severe malnutrition: continue supplements  ? EtOH use: denies significant EtOH use to me-monitor for withdrawals.  Disposition: Remain inpatient-home in 2-3 days-when work up complete  Antimicrobial agents  See below  Anti-infectives    Start     Dose/Rate Route Frequency Ordered Stop   03/28/15 2200  vancomycin (VANCOCIN) IVPB 750 mg/150 ml premix     750 mg 150 mL/hr over 60 Minutes Intravenous Every 24 hours 03/28/15 2114        DVT Prophylaxis: SCD's  Code Status: Full code   Family Communication None at bedside  Procedures: None  CONSULTS:  GI  Time spent 25 minutes-Greater than 50% of this time was spent in counseling, explanation of diagnosis, planning of further management, and coordination of care.  MEDICATIONS: Scheduled Meds: . antiseptic oral rinse  7 mL Mouth Rinse q12n4p  . atorvastatin  20 mg Oral q1800  . chlorhexidine  15 mL Mouth Rinse BID  . feeding supplement (ENSURE ENLIVE)  237 mL Oral BID BM  . fesoterodine  4 mg Oral Daily  . pantoprazole (PROTONIX) IV  40 mg Intravenous Q12H  . vancomycin  750 mg Intravenous Q24H   Continuous Infusions: . sodium chloride 75 mL/hr (03/30/15 0908)  . pantoprozole (PROTONIX) infusion 8 mg/hr (03/30/15 1026)   PRN Meds:.acetaminophen, LORazepam, ondansetron (ZOFRAN) IV    PHYSICAL EXAM: Vital signs in last 24 hours: Filed Vitals:   03/29/15 1629 03/29/15 2045 03/29/15 2344 03/30/15 0445  BP: 114/72 123/77 121/71 107/71  Pulse: 122 116 110 93  Temp: 98.4 F (36.9 C) 98 F (36.7 C) 98.3 F (36.8 C) 98.7 F (37.1 C)  TempSrc: Oral Oral Oral Oral  Resp: 22 19 21 14   Height:      Weight:    84.687 kg (186 lb 11.2 oz)  SpO2:  98% 99% 100%    Weight change: -0.113 kg (-4 oz) Filed Weights   03/28/15 0555 03/29/15 0615 03/30/15 0445  Weight: 78.9 kg (173 lb 15.1 oz) 84.8 kg (186 lb 15.2 oz) 84.687 kg (186 lb 11.2 oz)   Body  mass index is 26.05 kg/(m^2).   Gen Exam: Awake and alert with clear speech.  He looks chronically ill looking and pale. Neck: Supple, No JVD.   Chest: B/L Clear.  No rales or rhonchi CVS: S1 S2 Regular, no murmurs.  Abdomen: soft, BS +, non tender, non distended.  Extremities: no edema, lower extremities warm  to touch. Neurologic: Non Focal.   Skin: No Rash.   Wounds: N/A.    Intake/Output from previous day:  Intake/Output Summary (Last 24 hours) at 03/30/15 1044 Last data filed at 03/30/15 0900  Gross per 24 hour  Intake   2490 ml  Output   1100 ml  Net   1390 ml     LAB RESULTS: CBC  Recent Labs Lab 03/27/15 1442 03/28/15 0340 03/28/15 0427 03/28/15 1357 03/28/15 2257 03/29/15 0652 03/30/15 0317  WBC 7.5 7.8 6.7  --   --   --  9.7  HGB 8.8* 7.4* 7.0* 9.9* 10.7* 9.9* 10.0*  HCT 27.3* 23.5* 21.9* 29.6* 32.0* 29.3* 30.0*  PLT 360 273 275  --   --   --  230  MCV 90.1 91.4 90.1  --   --   --  88.2  MCH 29.0 28.8 28.8  --   --   --  29.4  MCHC 32.2 31.5 32.0  --   --   --  33.3  RDW 14.2 14.5 14.3  --   --   --  15.7*  LYMPHSABS 1.0  --   --   --   --   --   --   MONOABS 0.5  --   --   --   --   --   --   EOSABS 0.0  --   --   --   --   --   --   BASOSABS 0.0  --   --   --   --   --   --     Chemistries   Recent Labs Lab 03/27/15 1442 03/28/15 0340 03/28/15 0427 03/29/15 0827 03/30/15 0317  NA 138 140 141 142 143  K 5.1 4.6 4.6 4.1 4.6  CL 110 114* 115* 118* 123*  CO2 19* 18* 21* 17* 17*  GLUCOSE 127* 95 99 107* 104*  BUN 55* 50* 49* 32* 25*  CREATININE 3.12* 2.29* 2.32* 1.71* 1.50*  CALCIUM 9.3 8.2* 8.2* 7.9* 7.7*    CBG: No results for input(s): GLUCAP in the last 168 hours.  GFR Estimated Creatinine Clearance: 41.8 mL/min (by C-G formula based on Cr of 1.5).  Coagulation profile  Recent Labs Lab 03/27/15 1442 03/28/15 0427  INR 1.02 1.08    Cardiac Enzymes No results for input(s): CKMB, TROPONINI, MYOGLOBIN in the last 168 hours.  Invalid input(s): CK  Invalid input(s): POCBNP No results for input(s): DDIMER in the last 72 hours. No results for input(s): HGBA1C in the last 72 hours. No results for input(s): CHOL, HDL, LDLCALC, TRIG, CHOLHDL, LDLDIRECT in the last 72 hours. No results for input(s): TSH, T4TOTAL, T3FREE, THYROIDAB in the  last 72 hours.  Invalid input(s): FREET3 No results for input(s): VITAMINB12, FOLATE, FERRITIN, TIBC, IRON, RETICCTPCT in the last 72 hours.  Recent Labs  03/27/15 1442  LIPASE 71*    Urine Studies No results for input(s): UHGB, CRYS in the last 72 hours.  Invalid input(s): UACOL, UAPR, USPG, UPH, UTP, UGL, UKET, UBIL, UNIT, UROB, ULEU, UEPI, UWBC, URBC, UBAC, CAST, UCOM, BILUA  MICROBIOLOGY: Recent Results (from the  past 240 hour(s))  Culture, blood (routine x 2)     Status: None   Collection Time: 03/27/15  4:56 PM  Result Value Ref Range Status   Specimen Description BLOOD LEFT ARM  Final   Special Requests BOTTLES DRAWN AEROBIC AND ANAEROBIC 5CC  Final   Culture  Setup Time   Final    GRAM POSITIVE COCCI IN CLUSTERS AEROBIC BOTTLE ONLY CRITICAL RESULT CALLED TO, READ BACK BY AND VERIFIED WITH: J KOME 03/28/15 @ 19 M VESTAL    Culture   Final    STAPHYLOCOCCUS SPECIES (COAGULASE NEGATIVE) THE SIGNIFICANCE OF ISOLATING THIS ORGANISM FROM A SINGLE SET OF BLOOD CULTURES WHEN MULTIPLE SETS ARE DRAWN IS UNCERTAIN. PLEASE NOTIFY THE MICROBIOLOGY DEPARTMENT WITHIN ONE WEEK IF SPECIATION AND SENSITIVITIES ARE REQUIRED.    Report Status 03/30/2015 FINAL  Final  Culture, blood (routine x 2)     Status: None (Preliminary result)   Collection Time: 03/28/15  8:06 PM  Result Value Ref Range Status   Specimen Description BLOOD RIGHT HAND  Final   Special Requests BOTTLES DRAWN AEROBIC ONLY 5CC  Final   Culture NO GROWTH < 24 HOURS  Final   Report Status PENDING  Incomplete  Culture, blood (routine x 2)     Status: None (Preliminary result)   Collection Time: 03/28/15  8:37 PM  Result Value Ref Range Status   Specimen Description BLOOD RIGHT HAND  Final   Special Requests BOTTLES DRAWN AEROBIC ONLY 5CC  Final   Culture NO GROWTH < 24 HOURS  Final   Report Status PENDING  Incomplete    RADIOLOGY STUDIES/RESULTS: Dg Chest Port 1 View  03/27/2015   CLINICAL DATA:  Weakness,  fatigue, shortness of breath for 2 days. Ex-smoker  EXAM: PORTABLE CHEST - 1 VIEW  COMPARISON:  None.  FINDINGS: There is marked elevation of the left hemidiaphragm, of uncertain acuity. Lungs appear to be at least mildly hyperexpanded suggesting some degree of COPD. Lungs appear clear. No evidence of pneumonia. No pleural effusion. No pneumothorax.  Osseous structures are unremarkable. Cardiomediastinal silhouette appears normal in size and configuration, although a portion of the left heart border is obscured by the left hemidiaphragm elevation.  IMPRESSION: Lungs appear hyperexpanded suggesting some degree of underlying COPD.  Lungs are clear.  No evidence of acute cardiopulmonary abnormality.  Marked elevation of the left hemidiaphragm, of uncertain acuity but probably chronic.   Electronically Signed   By: Franki Cabot M.D.   On: 03/27/2015 14:55    Oren Binet, MD  [facility] Pager:[phone]  If 7PM-7AM, please contact night-coverage [email or web address] Password [email or web address] 03/30/2015, 10:44 AM   LOS: 3 days

## 2015-03-31 ENCOUNTER — Encounter (HOSPITAL_COMMUNITY): Payer: Self-pay | Admitting: Gastroenterology

## 2015-03-31 ENCOUNTER — Encounter: Payer: Self-pay | Admitting: *Deleted

## 2015-03-31 ENCOUNTER — Inpatient Hospital Stay (HOSPITAL_BASED_OUTPATIENT_CLINIC_OR_DEPARTMENT_OTHER): Payer: Medicare Other

## 2015-03-31 DIAGNOSIS — M79609 Pain in unspecified limb: Secondary | ICD-10-CM

## 2015-03-31 LAB — BASIC METABOLIC PANEL
ANION GAP: 3 — AB (ref 5–15)
BUN: 20 mg/dL (ref 6–20)
CO2: 17 mmol/L — ABNORMAL LOW (ref 22–32)
CREATININE: 1.28 mg/dL — AB (ref 0.61–1.24)
Calcium: 7.5 mg/dL — ABNORMAL LOW (ref 8.9–10.3)
Chloride: 120 mmol/L — ABNORMAL HIGH (ref 101–111)
GFR, EST AFRICAN AMERICAN: 59 mL/min — AB (ref >60)
GFR, EST NON AFRICAN AMERICAN: 51 mL/min — AB (ref >60)
GLUCOSE: 114 mg/dL — AB (ref 65–99)
POTASSIUM: 4.2 mmol/L (ref 3.5–5.1)
SODIUM: 140 mmol/L (ref 135–145)

## 2015-03-31 LAB — CBC
HEMATOCRIT: 28.8 % — AB (ref 39.0–52.0)
Hemoglobin: 9.5 g/dL — ABNORMAL LOW (ref 13.0–17.0)
MCH: 29.3 pg (ref 26.0–34.0)
MCHC: 33 g/dL (ref 30.0–36.0)
MCV: 88.9 fL (ref 78.0–100.0)
Platelets: 235 10*3/uL (ref 150–400)
RBC: 3.24 MIL/uL — ABNORMAL LOW (ref 4.22–5.81)
RDW: 15.6 % — AB (ref 11.5–15.5)
WBC: 8.7 10*3/uL (ref 4.0–10.5)

## 2015-03-31 MED ORDER — METOPROLOL TARTRATE 25 MG PO TABS
25.0000 mg | ORAL_TABLET | Freq: Two times a day (BID) | ORAL | Status: DC
  Administered 2015-03-31 – 2015-04-03 (×8): 25 mg via ORAL
  Filled 2015-03-31 (×8): qty 1

## 2015-03-31 NOTE — Care Management Important Message (Signed)
Important Message  Patient Details  Name: Benjamin Lawson MRN: [id number] Date of Birth: 12/01/1932   Medicare Important Message Given:  Yes-second notification given    Pricilla Handler 03/31/2015, 12:37 PM

## 2015-03-31 NOTE — Progress Notes (Signed)
Utilization review completed.  

## 2015-03-31 NOTE — Progress Notes (Signed)
PATIENT DETAILS Name: Benjamin Lawson Age: 81 y.o. Sex: male Date of Birth: [birth year] Admit Date: 03/27/2015 Admitting Physician Truett Mainland, DO JJH:ERDEYCXK,GYJEHU G, MD  Subjective: Feels better-no melanoma per RN-enies any shortness of breath. Slightly more tachycardic this morning.   Assessment/Plan: Active Problems: Hypotension: Resolved.Likely secondary to hypovolemia from GI bleed and continued use of anti-hypertensive medications. Blood pressure much better following IV fluid resuscitation and transfusion of 2 units of PRBC. . Follow closely.  Acute blood loss anemia: Secondary to Suspected upper GI bleed. Transfused 2 units of PRBC-following which Hb remains stable.Follow hemoglobin closely.  Upper GI bleed: Presented with hypotension, worsening anemia and frank melanotic stools 1 post admission. Started on PPI, GI consulted-underwent EGD on 7/29-findings concerning for gastric cancer-confirmed adenocarcinoma by biopsy.Oncology consulted. Thankfully no further bleeding, hemoglobin and blood pressure significantly improved. Will continue to monitor closely   Acute renal failure: Likely prerenal azotemia in a setting of acute blood loss and continued antihypertensives use (Micardis HCTZ). Creatinine significantly improved. Continue to [facility], follow closely.   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 Await Oncology eval. Once creatinine further improves-will order CT chest and CT abdomen.   Sinus Tachycardia: monitor for bleed/hematochezia-check lower ext doppler-but patient not SOB. Start low dose Metoprolol.   Coag neg staph bacteremia: Likely contamination-repeat blood culture negative. Discontinue vancomycin-and monitor off Abx  History of hypertension: start low dose metoprolol-BP more improved.   Known severe malnutrition: continue supplements  ? EtOH use: denies significant EtOH use to me-monitor for  withdrawals.  Disposition: Remain inpatient-home in 2-3 days-when work up complete  Antimicrobial agents  See below  Anti-infectives    Start     Dose/Rate Route Frequency Ordered Stop   03/28/15 2200  vancomycin (VANCOCIN) IVPB 750 mg/150 ml premix  Status:  Discontinued     750 mg 150 mL/hr over 60 Minutes Intravenous Every 24 hours 03/28/15 2114 03/30/15 1320      DVT Prophylaxis: SCD's  Code Status: Full code -have asked family to discuss with patient-re code status-family will let us know  Family Communication Son at bedside Daughter over the phone  Procedures: None  CONSULTS:  GI   Oncology  Time spent 25 minutes-Greater than 50% of this time was spent in counseling, explanation of diagnosis, planning of further management, and coordination of care.  MEDICATIONS: Scheduled Meds: . antiseptic oral rinse  7 mL Mouth Rinse q12n4p  . atorvastatin  20 mg Oral q1800  . chlorhexidine  15 mL Mouth Rinse BID  . feeding supplement (ENSURE ENLIVE)  237 mL Oral BID BM  . fesoterodine  4 mg Oral Daily  . metoprolol tartrate  25 mg Oral BID  . pantoprazole (PROTONIX) IV  40 mg Intravenous Q12H   Continuous Infusions: . sodium chloride 100 mL/hr at 03/31/15 1146   PRN Meds:.acetaminophen, LORazepam, ondansetron (ZOFRAN) IV    PHYSICAL EXAM: Vital signs in last 24 hours: Filed Vitals:   03/31/15 0500 03/31/15 0845 03/31/15 1121 03/31/15 1124  BP: 99/61 109/55 126/70   Pulse: 99 62 66 124  Temp: 98.3 F (36.8 C)     TempSrc: Oral     Resp: 20 23 21 18   Height:      Weight: 85.594 kg (188 lb 11.2 oz)     SpO2: 99% 100% 100% 100%    Weight change: 0.907 kg (2 lb) Filed Weights   03/29/15 0615 03/30/15 0445  03/31/15 0500  Weight: 84.8 kg (186 lb 15.2 oz) 84.687 kg (186 lb 11.2 oz) 85.594 kg (188 lb 11.2 oz)   Body mass index is 26.33 kg/(m^2).   Gen Exam: Awake and alert with clear speech.  He looks chronically ill looking and pale. Neck: Supple, No JVD.    Chest: B/L Clear.  No rales or rhonchi CVS: S1 S2 Regular, no murmurs.  Abdomen: soft, BS +, non tender, non distended.  Extremities: no edema, lower extremities warm to touch. Neurologic: Non Focal.   Skin: No Rash.   Wounds: N/A.    Intake/Output from previous day:  Intake/Output Summary (Last 24 hours) at 03/31/15 1435 Last data filed at 03/31/15 0900  Gross per 24 hour  Intake    600 ml  Output   1125 ml  Net   -525 ml     LAB RESULTS: CBC  Recent Labs Lab 03/27/15 1442 03/28/15 0340 03/28/15 0427 03/28/15 1357 03/28/15 2257 03/29/15 0652 03/30/15 0317 03/31/15 0332  WBC 7.5 7.8 6.7  --   --   --  9.7 8.7  HGB 8.8* 7.4* 7.0* 9.9* 10.7* 9.9* 10.0* 9.5*  HCT 27.3* 23.5* 21.9* 29.6* 32.0* 29.3* 30.0* 28.8*  PLT 360 273 275  --   --   --  230 235  MCV 90.1 91.4 90.1  --   --   --  88.2 88.9  MCH 29.0 28.8 28.8  --   --   --  29.4 29.3  MCHC 32.2 31.5 32.0  --   --   --  33.3 33.0  RDW 14.2 14.5 14.3  --   --   --  15.7* 15.6*  LYMPHSABS 1.0  --   --   --   --   --   --   --   MONOABS 0.5  --   --   --   --   --   --   --   EOSABS 0.0  --   --   --   --   --   --   --   BASOSABS 0.0  --   --   --   --   --   --   --     Chemistries   Recent Labs Lab 03/28/15 0340 03/28/15 0427 03/29/15 0827 03/30/15 0317 03/31/15 0332  NA 140 141 142 143 140  K 4.6 4.6 4.1 4.6 4.2  CL 114* 115* 118* 123* 120*  CO2 18* 21* 17* 17* 17*  GLUCOSE 95 99 107* 104* 114*  BUN 50* 49* 32* 25* 20  CREATININE 2.29* 2.32* 1.71* 1.50* 1.28*  CALCIUM 8.2* 8.2* 7.9* 7.7* 7.5*    CBG: No results for input(s): GLUCAP in the last 168 hours.  GFR Estimated Creatinine Clearance: 49 mL/min (by C-G formula based on Cr of 1.28).  Coagulation profile  Recent Labs Lab 03/27/15 1442 03/28/15 0427  INR 1.02 1.08    Cardiac Enzymes No results for input(s): CKMB, TROPONINI, MYOGLOBIN in the last 168 hours.  Invalid input(s): CK  Invalid input(s): POCBNP No results for  input(s): DDIMER in the last 72 hours. No results for input(s): HGBA1C in the last 72 hours. No results for input(s): CHOL, HDL, LDLCALC, TRIG, CHOLHDL, LDLDIRECT in the last 72 hours. No results for input(s): TSH, T4TOTAL, T3FREE, THYROIDAB in the last 72 hours.  Invalid input(s): FREET3 No results for input(s): VITAMINB12, FOLATE, FERRITIN, TIBC, IRON, RETICCTPCT in the last 72 hours. No results for input(s):  LIPASE, AMYLASE in the last 72 hours.  Urine Studies No results for input(s): UHGB, CRYS in the last 72 hours.  Invalid input(s): UACOL, UAPR, USPG, UPH, UTP, UGL, UKET, UBIL, UNIT, UROB, ULEU, UEPI, UWBC, URBC, UBAC, CAST, UCOM, BILUA  MICROBIOLOGY: Recent Results (from the past 240 hour(s))  Culture, blood (routine x 2)     Status: None   Collection Time: 03/27/15  4:56 PM  Result Value Ref Range Status   Specimen Description BLOOD LEFT ARM  Final   Special Requests BOTTLES DRAWN AEROBIC AND ANAEROBIC 5CC  Final   Culture  Setup Time   Final    GRAM POSITIVE COCCI IN CLUSTERS AEROBIC BOTTLE ONLY CRITICAL RESULT CALLED TO, READ BACK BY AND VERIFIED WITH: J KOME 03/28/15 @ 38 M VESTAL    Culture   Final    STAPHYLOCOCCUS SPECIES (COAGULASE NEGATIVE) THE SIGNIFICANCE OF ISOLATING THIS ORGANISM FROM A SINGLE SET OF BLOOD CULTURES WHEN MULTIPLE SETS ARE DRAWN IS UNCERTAIN. PLEASE NOTIFY THE MICROBIOLOGY DEPARTMENT WITHIN ONE WEEK IF SPECIATION AND SENSITIVITIES ARE REQUIRED.    Report Status 03/30/2015 FINAL  Final  Culture, blood (routine x 2)     Status: None (Preliminary result)   Collection Time: 03/28/15  8:06 PM  Result Value Ref Range Status   Specimen Description BLOOD RIGHT HAND  Final   Special Requests BOTTLES DRAWN AEROBIC ONLY 5CC  Final   Culture NO GROWTH 2 DAYS  Final   Report Status PENDING  Incomplete  Culture, blood (routine x 2)     Status: None (Preliminary result)   Collection Time: 03/28/15  8:37 PM  Result Value Ref Range Status   Specimen  Description BLOOD RIGHT HAND  Final   Special Requests BOTTLES DRAWN AEROBIC ONLY 5CC  Final   Culture NO GROWTH 2 DAYS  Final   Report Status PENDING  Incomplete    RADIOLOGY STUDIES/RESULTS: Dg Chest Port 1 View  03/27/2015   CLINICAL DATA:  Weakness, fatigue, shortness of breath for 2 days. Ex-smoker  EXAM: PORTABLE CHEST - 1 VIEW  COMPARISON:  None.  FINDINGS: There is marked elevation of the left hemidiaphragm, of uncertain acuity. Lungs appear to be at least mildly hyperexpanded suggesting some degree of COPD. Lungs appear clear. No evidence of pneumonia. No pleural effusion. No pneumothorax.  Osseous structures are unremarkable. Cardiomediastinal silhouette appears normal in size and configuration, although a portion of the left heart border is obscured by the left hemidiaphragm elevation.  IMPRESSION: Lungs appear hyperexpanded suggesting some degree of underlying COPD.  Lungs are clear.  No evidence of acute cardiopulmonary abnormality.  Marked elevation of the left hemidiaphragm, of uncertain acuity but probably chronic.   Electronically Signed   By: Franki Cabot M.D.   On: 03/27/2015 14:55    Oren Binet, MD  [facility] Pager:[phone]  If 7PM-7AM, please contact night-coverage [email or web address] Password [email or web address] 03/31/2015, 2:35 PM   LOS: 4 days

## 2015-03-31 NOTE — Care Management Important Message (Signed)
Important Message  Patient Details  Name: Benjamin Lawson MRN: [id number] Date of Birth: 03-Feb-1933   Medicare Important Message Given:  Yes-second notification given    Pricilla Handler 03/31/2015, 12:37 PM

## 2015-03-31 NOTE — Progress Notes (Signed)
pt HR 130s, now down to 115, asymtomatic. BP 126/69. MD notified, will continue to monitor

## 2015-03-31 NOTE — Progress Notes (Signed)
Physical Therapy Treatment Patient Details Name: Benjamin Lawson MRN: [id number] DOB: 1933/01/05 Today's Date: 03/31/2015    History of Present Illness 81 yo male With a history of hypertension, hyperlipidemia, alcohol abuse, and diabetes with peripheral vascular disorder, stage II chronic kidney disease. The patient was sent to the emergency department from his doctor's office due to severe hypotension; Noted UGIB, and findings on endoscopy concerning for gastric Ca; workup continues    PT Comments    Pt making great progress in therapy and was able to ambulate around nursing unit today, however HR continues to be very erratic during gait, however RN states that MD is aware.  Feel that [facility] may be appropriate to get pt to more functional level as pt will be alone at some parts of the day.    Follow Up Recommendations  SNF (unless progresses quickly)     Equipment Recommendations  Rolling walker with 5" wheels    Recommendations for Other Services OT consult     Precautions / Restrictions Precautions Precautions: Fall Precaution Comments: HR is very irregular and elevated, RN and MD aware.  Restrictions Weight Bearing Restrictions: No    Mobility  Bed Mobility Overal bed mobility: Needs Assistance Bed Mobility: Supine to Sit     Supine to sit: Min assist     General bed mobility comments: Cues for use of bed rail to self assist, min A to elevate trunk and to scoot to EOB.    Transfers Overall transfer level: Needs assistance Equipment used: Rolling walker (2 wheeled) Transfers: Sit to/from Stand Sit to Stand: +2 physical assistance;Mod assist         General transfer comment: Continues to require min/mod A to power up into standing with mod cues for hand placement and facilitation for forward weight shift.   Ambulation/Gait Ambulation/Gait assistance: Min assist;Mod assist Ambulation Distance (Feet): 200 Feet Assistive device: Rolling walker (2 wheeled) Gait  Pattern/deviations: Step-through pattern;Decreased stride length;Trunk flexed     General Gait Details: Pt able to ambulate around nursing station today with use of RW at mod fading to min A level.  Pt continues to have very irregular and elevated HR, RN aware and MD as well, therefore proceeded with caution.  Pt asymmptomatic throughout with SaO2 in the 90s throughout.     Stairs            Wheelchair Mobility    Modified Rankin (Stroke Patients Only)       Balance Overall balance assessment: Needs assistance Sitting-balance support: Feet supported Sitting balance-Leahy Scale: Fair     Standing balance support: During functional activity;Bilateral upper extremity supported Standing balance-Leahy Scale: Poor                      Cognition Arousal/Alertness: Awake/alert Behavior During Therapy: WFL for tasks assessed/performed Overall Cognitive Status: Within Functional Limits for tasks assessed                      Exercises      General Comments        Pertinent Vitals/Pain Pain Assessment: No/denies pain    Home Living                      Prior Function            PT Goals (current goals can now be found in the care plan section) Acute Rehab PT Goals Patient Stated Goal: to get better  PT Goal Formulation: With patient Time For Goal Achievement: 04/13/15 Potential to Achieve Goals: Good Progress towards PT goals: Progressing toward goals    Frequency  Min 3X/week    PT Plan Discharge plan needs to be updated    Co-evaluation             End of Session Equipment Utilized During Treatment: Gait belt Activity Tolerance: Patient tolerated treatment well Patient left: in chair;with call bell/phone within reach     Time: 1116-1145 PT Time Calculation (min) (ACUTE ONLY): 29 min  Charges:  $Gait Training: 23-37 mins                    G Codes:      Denice Bors 03/31/2015, 1:19 PM

## 2015-03-31 NOTE — Progress Notes (Signed)
Notified Dr. Sloan Leiter that Dr. Benay Spice (medical oncology) will see patient at [facility] tomorrow morning. OK with this plan.

## 2015-03-31 NOTE — Care Management Note (Signed)
Case Management Note [name] RN, BSN Unit 2W-Case Manager [phone] Covering for 3W  Patient Details  Name: Benjamin Lawson MRN: [id number] Date of Birth: 12/18/1932  Subjective/Objective:     Pt admitted with hypotension and GIB               Action/Plan:  PTA pt lived at home PT recommending [facility] vs SNF pending progress- NCM to follow   Expected Discharge Date:                  Expected Discharge Plan:  [place]  In-House Referral:  Clinical Social Work  Discharge planning Services  CM Consult  Post Acute Care Choice:    Choice offered to:     DME Arranged:    DME Agency:     HH Arranged:    [place] Agency:     Status of Service:  In process, will continue to follow  Medicare Important Message Given:  Yes-second notification given Date Medicare IM Given:    Medicare IM give by:    Date Additional Medicare IM Given:    Additional Medicare Important Message give by:     If discussed at [place] of Stay Meetings, dates discussed:  04/01/15  Additional Comments:  04/01/15- awaiting oncology consult, cont. IVF  Dahlia Client Romeo Rabon, RN 03/31/2015, 3:29 PM

## 2015-03-31 NOTE — Progress Notes (Signed)
VASCULAR LAB PRELIMINARY  PRELIMINARY  PRELIMINARY  PRELIMINARY  Bilateral lower extremity venous duplex completed.    Preliminary report:  There is no DVT or SVT noted in the bilateral lower extremities.   Marcellene Shivley, RVT 03/31/2015, 3:04 PM

## 2015-04-01 ENCOUNTER — Inpatient Hospital Stay (HOSPITAL_COMMUNITY): Payer: Medicare Other

## 2015-04-01 DIAGNOSIS — E440 Moderate protein-calorie malnutrition: Secondary | ICD-10-CM

## 2015-04-01 DIAGNOSIS — D630 Anemia in neoplastic disease: Secondary | ICD-10-CM

## 2015-04-01 DIAGNOSIS — K922 Gastrointestinal hemorrhage, unspecified: Secondary | ICD-10-CM

## 2015-04-01 DIAGNOSIS — C160 Malignant neoplasm of cardia: Principal | ICD-10-CM

## 2015-04-01 DIAGNOSIS — I959 Hypotension, unspecified: Secondary | ICD-10-CM

## 2015-04-01 DIAGNOSIS — D500 Iron deficiency anemia secondary to blood loss (chronic): Secondary | ICD-10-CM

## 2015-04-01 DIAGNOSIS — R4182 Altered mental status, unspecified: Secondary | ICD-10-CM

## 2015-04-01 LAB — BASIC METABOLIC PANEL
Anion gap: 5 (ref 5–15)
BUN: 20 mg/dL (ref 6–20)
CALCIUM: 7.9 mg/dL — AB (ref 8.9–10.3)
CO2: 19 mmol/L — ABNORMAL LOW (ref 22–32)
Chloride: 116 mmol/L — ABNORMAL HIGH (ref 101–111)
Creatinine, Ser: 1.34 mg/dL — ABNORMAL HIGH (ref 0.61–1.24)
GFR calc Af Amer: 56 mL/min — ABNORMAL LOW (ref >60)
GFR calc non Af Amer: 48 mL/min — ABNORMAL LOW (ref >60)
GLUCOSE: 94 mg/dL (ref 65–99)
Potassium: 4.2 mmol/L (ref 3.5–5.1)
SODIUM: 140 mmol/L (ref 135–145)

## 2015-04-01 LAB — CBC
HEMATOCRIT: 30.2 % — AB (ref 39.0–52.0)
Hemoglobin: 9.8 g/dL — ABNORMAL LOW (ref 13.0–17.0)
MCH: 28.7 pg (ref 26.0–34.0)
MCHC: 32.5 g/dL (ref 30.0–36.0)
MCV: 88.3 fL (ref 78.0–100.0)
Platelets: 265 10*3/uL (ref 150–400)
RBC: 3.42 MIL/uL — AB (ref 4.22–5.81)
RDW: 15.6 % — ABNORMAL HIGH (ref 11.5–15.5)
WBC: 7.8 10*3/uL (ref 4.0–10.5)

## 2015-04-01 MED ORDER — IOHEXOL 300 MG/ML  SOLN
25.0000 mL | INTRAMUSCULAR | Status: AC
  Administered 2015-04-01 (×2): 25 mL via ORAL

## 2015-04-01 MED ORDER — PANTOPRAZOLE SODIUM 40 MG PO TBEC
40.0000 mg | DELAYED_RELEASE_TABLET | Freq: Two times a day (BID) | ORAL | Status: DC
  Administered 2015-04-01 – 2015-04-04 (×6): 40 mg via ORAL
  Filled 2015-04-01 (×6): qty 1

## 2015-04-01 NOTE — Clinical Social Work Note (Signed)
Clinical Social Work Assessment  Patient Details  Name: Benjamin Lawson MRN: [id number] Date of Birth: 1932/12/11  Date of referral:  04/01/15               Reason for consult:  Facility Placement                Permission sought to share information with:  Facility Art therapist granted to share information::  Yes, Verbal Permission Granted (By family)  Name::        Agency::  SNFs for referral purposes  Relationship::     Contact Information:     Housing/Transportation Living arrangements for the past 2 months:  Single Family Home Source of Information:  Adult Children Patient Interpreter Needed:  None Criminal Activity/Legal Involvement Pertinent to Current Situation/Hospitalization:  No - Comment as needed Significant Relationships:  Adult Children Lives with:  Other (Comment) (Pt mothers lives in the same household; Pt daughter lives across the street) Do you feel safe going back to the place where you live?  Yes Need for family participation in patient care:  Yes (Comment)  Care giving concerns:  Pt needing increased assistance with mobility. PT recommendation changed to SNF   Social Worker assessment / plan:  CSW spoke with pt daughter over the phone. She confirmed she is aware of recommendation and agreeable to [facility] rehab if that is what medical team feels is best for pt. Pt daughter agreeable to referral being sent to all [facility]. She expressed no preferences, but did state she knows of some facilities she would not want pt to dc to. She was unable to recall the names and informed CSW that plan can remain to send referral to all facilities and she will find out names for when she provided bed offers.  Employment status:  Retired Forensic scientist:  Medicare PT Recommendations:  [place] / Referral to community resources:  [place]  Patient/Family's Response to care:  Pt daughter in agreement  that [place] rehab is safest dc plan.  Patient/Family's Understanding of and Emotional Response to Diagnosis, Current Treatment, and Prognosis:  Pt daughter has a good understanding of pt condition. Per chart review, pt with memory impairment. Pt daughter emotional response is appropriate at this time.  Emotional Assessment Appearance:  Appears stated age Attitude/Demeanor/Rapport:  Unable to Assess Affect (typically observed):  Unable to Assess Orientation:  Oriented to Self, Oriented to  Time, Oriented to Situation Alcohol / Substance use:  Not Applicable Psych involvement (Current and /or in the community):  No (Comment)  Discharge Needs  Concerns to be addressed:  Discharge Planning Concerns Readmission within the last 30 days:  No Current discharge risk:  Dependent with Mobility Barriers to Discharge:  Continued Medical Work up   [employer], [place]

## 2015-04-01 NOTE — Clinical Social Work Placement (Signed)
   CLINICAL SOCIAL WORK PLACEMENT  NOTE  Date:  04/01/2015  Patient Details  Name: Benjamin Lawson MRN: [id number] Date of Birth: 04-25-1933  Clinical Social Work is seeking post-discharge placement for this patient at the [place] level of care (*CSW will initial, date and re-position this form in  chart as items are completed):  Yes   Patient/family provided with [place] Work Department's list of facilities offering this level of care within the geographic area requested by the patient (or if unable, by the patient's family).  Yes   Patient/family informed of their freedom to choose among providers that offer the needed level of care, that participate in Medicare, Medicaid or managed care program needed by the patient, have an available bed and are willing to accept the patient.  Yes   Patient/family informed of [facility]'s ownership interest in [facility] and [facility], as well as of the fact that they are under no obligation to receive care at these facilities.  PASRR submitted to EDS on 04/01/15     PASRR number received on 04/01/15     Existing PASRR number confirmed on       FL2 transmitted to all facilities in geographic area requested by pt/family on 04/01/15     FL2 transmitted to all facilities within larger geographic area on       Patient informed that his/her managed care company has contracts with or will negotiate with certain facilities, including the following:            Patient/family informed of bed offers received.  Patient chooses bed at       Physician recommends and patient chooses bed at      Patient to be transferred to   on  .  Patient to be transferred to facility by       Patient family notified on   of transfer.  Name of family member notified:        PHYSICIAN Please prepare priority discharge summary, including medications, Please sign FL2, Please prepare prescriptions     Additional  Comment:    _______________________________________________ Berton Mount, [place]

## 2015-04-01 NOTE — Progress Notes (Signed)
Physical Therapy Treatment Patient Details Name: Benjamin Lawson MRN: [id number] DOB: [birth year] Today's Date: 04/01/2015    History of Present Illness 81 yo male With a history of hypertension, hyperlipidemia, alcohol abuse, and diabetes with peripheral vascular disorder, stage II chronic kidney disease. The patient was sent to the emergency department from his doctor's office due to severe hypotension; Noted UGIB, and findings on endoscopy concerning for gastric Ca; workup continues    PT Comments    Pt continues to be able to increase ambulation distance, however still requires min A for overall mobility.  Unsure if family able to provide this, therefore continue to recommend ST SNF placement to increase pts functional independence.   Follow Up Recommendations  SNF;Supervision/Assistance - 24 hour     Equipment Recommendations  Rolling walker with 5" wheels    Recommendations for Other Services OT consult     Precautions / Restrictions Precautions Precautions: Fall Precaution Comments: HR is very irregular and elevated, RN and MD aware.  Restrictions Weight Bearing Restrictions: No    Mobility  Bed Mobility Overal bed mobility: Needs Assistance Bed Mobility: Supine to Sit     Supine to sit: Min assist     General bed mobility comments: Had pt perform bed mobility with HOB flat today but he did need use of rail and min A for rolling and lowering legs off EOB.    Transfers Overall transfer level: Needs assistance Equipment used: Rolling walker (2 wheeled) Transfers: Sit to/from Stand Sit to Stand: Min assist         General transfer comment: Pt able to stand today with min A however requires max verbal cues to recall hand placement, even with only 1-2 mins prior to having just performed.  Also provided light facilitation and cues for increased forward weight shift.   Ambulation/Gait Ambulation/Gait assistance: Min assist Ambulation Distance (Feet): 250  Feet Assistive device: Rolling walker (2 wheeled) Gait Pattern/deviations: Step-through pattern;Decreased stride length;Trunk flexed     General Gait Details: Pt able to increase ambulation distance today.  Note HR up to 140's during gait.  RN made aware.  Cues for upright posture and safety with RW during gait, esp when making turns.  SaO2 WFL during session on RA.    Stairs            Wheelchair Mobility    Modified Rankin (Stroke Patients Only)       Balance Overall balance assessment: Needs assistance Sitting-balance support: Feet supported Sitting balance-Leahy Scale: Fair     Standing balance support: Single extremity supported;During functional activity Standing balance-Leahy Scale: Poor Standing balance comment: Attempted to have pt perform peri care this morning, however requires single UE support and was unable to get full ROM needed for cleaning.                     Cognition Arousal/Alertness: Awake/alert Behavior During Therapy: WFL for tasks assessed/performed Overall Cognitive Status: Within Functional Limits for tasks assessed       Memory: Decreased recall of precautions              Exercises      General Comments General comments (skin integrity, edema, etc.): watch HR, RN and MD aware      Pertinent Vitals/Pain Pain Assessment: No/denies pain    Home Living                      Prior Function  PT Goals (current goals can now be found in the care plan section) Acute Rehab PT Goals Patient Stated Goal: to get better PT Goal Formulation: With patient Time For Goal Achievement: 04/13/15 Potential to Achieve Goals: Good Progress towards PT goals: Progressing toward goals    Frequency  Min 3X/week    PT Plan Discharge plan needs to be updated    Co-evaluation             End of Session Equipment Utilized During Treatment: Gait belt Activity Tolerance: Patient tolerated treatment well Patient  left: in chair;with call bell/phone within reach     Time: 0912-0942 PT Time Calculation (min) (ACUTE ONLY): 30 min  Charges:  $Gait Training: 8-22 mins $Therapeutic Activity: 8-22 mins                    G Codes:      Denice Bors 04/01/2015, 10:02 AM

## 2015-04-01 NOTE — Progress Notes (Signed)
PATIENT DETAILS Name: Benjamin Lawson Age: 81 y.o. Sex: male Date of Birth: Apr 14, 1933 Admit Date: 03/27/2015 Admitting Physician Truett Mainland, DO FGH:[id number],RCVELF G, MD  Brief narrative:  81 year old male with history of hypertension and chronic kidney disease stage III admitted for evaluation of weakness, hypotension and acute renal failure. Further evaluation, reviewed hypotension secondary to acute blood loss anemia from upper GI bleed. Gastroenterology was consulted, patient underwent EGD which is now revealed gastric adenocarcinoma. Oncology consulted, staging CT ordered. Plans are for discharge to SNF in the next 1-2 days.  Subjective: Some confusion last night-but awake and alert this morning.  Assessment/Plan: Active Problems: Hypotension: Resolved.Likely secondary to hypovolemia from GI bleed and continued use of anti-hypertensive medications. Blood pressure much better following IV fluid resuscitation and transfusion of 2 units of PRBC. . Follow closely.  Acute blood loss anemia: Secondary to Suspected upper GI bleed. Transfused 2 units of PRBC-following which Hb remains stable.Follow hemoglobin closely.  Upper GI bleed: Presented with hypotension, worsening anemia and frank melanotic stools 1 post admission. Started on PPI, GI consulted-underwent EGD on 7/29-findings concerning for gastric cancer-confirmed adenocarcinoma by biopsy.Oncology consulted, unfortunately looks like patient has stage III chronic kidney disease, hence will order CT chest and abdomen without contrast. Oncology to provide further recommendations.   Acute on chronic kidney disease stage III: Likely prerenal azotemia in a setting of acute blood loss and continued antihypertensives use (Micardis HCTZ). 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 Oncology to provide further recommendations  Sinus Tachycardia: Improved, lower extremity Doppler negative. No evidence of recurrence of melanoma-hemoglobin stable. Much better with low-dose metoprolol. Follow.  Coag neg staph bacteremia: Likely contamination-repeat blood culture negative. Discontinued vancomycin-and monitor off Abx  History of hypertension: start low dose metoprolol-BP more improved.   Known severe malnutrition: continue supplements  ? EtOH use: denies significant EtOH use to me-monitor for withdrawals.  Disposition: Remain inpatient-SNF in next 1-2 days  Antimicrobial agents  See below  Anti-infectives    Start     Dose/Rate Route Frequency Ordered Stop   03/28/15 2200  vancomycin (VANCOCIN) IVPB 750 mg/150 ml premix  Status:  Discontinued     750 mg 150 mL/hr over 60 Minutes Intravenous Every 24 hours 03/28/15 2114 03/30/15 1320      DVT Prophylaxis: SCD's  Code Status: Full code -  Family Communication Daughter over the phone  Procedures: None  CONSULTS:  GI   Oncology  Time spent 25 minutes-Greater than 50% of this time was spent in counseling, explanation of diagnosis, planning of further management, and coordination of care.  MEDICATIONS: Scheduled Meds: . antiseptic oral rinse  7 mL Mouth Rinse q12n4p  . atorvastatin  20 mg Oral q1800  . chlorhexidine  15 mL Mouth Rinse BID  . feeding supplement (ENSURE ENLIVE)  237 mL Oral BID BM  . fesoterodine  4 mg Oral Daily  . metoprolol tartrate  25 mg Oral BID  . pantoprazole (PROTONIX) IV  40 mg Intravenous Q12H   Continuous Infusions: . sodium chloride 50 mL/hr at 03/31/15 2059   PRN Meds:.acetaminophen, LORazepam, ondansetron (ZOFRAN) IV    PHYSICAL EXAM: Vital signs in last 24 hours: Filed Vitals:   03/31/15 1530 03/31/15 2044 04/01/15 0601 04/01/15  1021  BP: 130/64 106/70 108/74  111/70  Pulse: 67 102 93   Temp: 98.2 F (36.8 C) 98.6 F (37 C) 98.9 F (37.2 C)   TempSrc: Oral Oral Oral   Resp: 18 18 18    Height:      Weight:   88.587 kg (195 lb 4.8 oz)   SpO2: 100% 100% 97%     Weight change: 2.994 kg (6 lb 9.6 oz) Filed Weights   03/30/15 0445 03/31/15 0500 04/01/15 0601  Weight: 84.687 kg (186 lb 11.2 oz) 85.594 kg (188 lb 11.2 oz) 88.587 kg (195 lb 4.8 oz)   Body mass index is 27.25 kg/(m^2).   Gen Exam: Awake and alert with clear speech.  He looks chronically ill looking and pale. Neck: Supple, No JVD.   Chest: B/L Clear.  No rales or rhonchi CVS: S1 S2 Regular, no murmurs.  Abdomen: soft, BS +, non tender, non distended.  Extremities: no edema, lower extremities warm to touch. Neurologic: Non Focal.   Skin: No Rash.   Wounds: N/A.    Intake/Output from previous day:  Intake/Output Summary (Last 24 hours) at 04/01/15 1310 Last data filed at 04/01/15 0601  Gross per 24 hour  Intake      0 ml  Output    425 ml  Net   -425 ml     LAB RESULTS: CBC  Recent Labs Lab 03/27/15 1442 03/28/15 0340 03/28/15 0427  03/28/15 2257 03/29/15 0652 03/30/15 0317 03/31/15 0332 04/01/15 0432  WBC 7.5 7.8 6.7  --   --   --  9.7 8.7 7.8  HGB 8.8* 7.4* 7.0*  < > 10.7* 9.9* 10.0* 9.5* 9.8*  HCT 27.3* 23.5* 21.9*  < > 32.0* 29.3* 30.0* 28.8* 30.2*  PLT 360 273 275  --   --   --  230 235 265  MCV 90.1 91.4 90.1  --   --   --  88.2 88.9 88.3  MCH 29.0 28.8 28.8  --   --   --  29.4 29.3 28.7  MCHC 32.2 31.5 32.0  --   --   --  33.3 33.0 32.5  RDW 14.2 14.5 14.3  --   --   --  15.7* 15.6* 15.6*  LYMPHSABS 1.0  --   --   --   --   --   --   --   --   MONOABS 0.5  --   --   --   --   --   --   --   --   EOSABS 0.0  --   --   --   --   --   --   --   --   BASOSABS 0.0  --   --   --   --   --   --   --   --   < > = values in this interval not displayed.  Chemistries   Recent Labs Lab 03/28/15 0427 03/29/15 0827 03/30/15 0317 03/31/15 0332  04/01/15 0432  NA 141 142 143 140 140  K 4.6 4.1 4.6 4.2 4.2  CL 115* 118* 123* 120* 116*  CO2 21* 17* 17* 17* 19*  GLUCOSE 99 107* 104* 114* 94  BUN 49* 32* 25* 20 20  CREATININE 2.32* 1.71* 1.50* 1.28* 1.34*  CALCIUM 8.2* 7.9* 7.7* 7.5* 7.9*    CBG: No results for input(s): GLUCAP in the last 168 hours.  GFR Estimated Creatinine Clearance: 46.8 mL/min (by  C-G formula based on Cr of 1.34).  Coagulation profile  Recent Labs Lab 03/27/15 1442 03/28/15 0427  INR 1.02 1.08    Cardiac Enzymes No results for input(s): CKMB, TROPONINI, MYOGLOBIN in the last 168 hours.  Invalid input(s): CK  Invalid input(s): POCBNP No results for input(s): DDIMER in the last 72 hours. No results for input(s): HGBA1C in the last 72 hours. No results for input(s): CHOL, HDL, LDLCALC, TRIG, CHOLHDL, LDLDIRECT in the last 72 hours. No results for input(s): TSH, T4TOTAL, T3FREE, THYROIDAB in the last 72 hours.  Invalid input(s): FREET3 No results for input(s): VITAMINB12, FOLATE, FERRITIN, TIBC, IRON, RETICCTPCT in the last 72 hours. No results for input(s): LIPASE, AMYLASE in the last 72 hours.  Urine Studies No results for input(s): UHGB, CRYS in the last 72 hours.  Invalid input(s): UACOL, UAPR, USPG, UPH, UTP, UGL, UKET, UBIL, UNIT, UROB, ULEU, UEPI, UWBC, URBC, UBAC, CAST, UCOM, BILUA  MICROBIOLOGY: Recent Results (from the past 240 hour(s))  Culture, blood (routine x 2)     Status: None   Collection Time: 03/27/15  4:56 PM  Result Value Ref Range Status   Specimen Description BLOOD LEFT ARM  Final   Special Requests BOTTLES DRAWN AEROBIC AND ANAEROBIC 5CC  Final   Culture  Setup Time   Final    GRAM POSITIVE COCCI IN CLUSTERS AEROBIC BOTTLE ONLY CRITICAL RESULT CALLED TO, READ BACK BY AND VERIFIED WITH: J KOME 03/28/15 @ 66 M VESTAL    Culture   Final    STAPHYLOCOCCUS SPECIES (COAGULASE NEGATIVE) THE SIGNIFICANCE OF ISOLATING THIS ORGANISM FROM A SINGLE SET OF BLOOD CULTURES  WHEN MULTIPLE SETS ARE DRAWN IS UNCERTAIN. PLEASE NOTIFY THE MICROBIOLOGY DEPARTMENT WITHIN ONE WEEK IF SPECIATION AND SENSITIVITIES ARE REQUIRED.    Report Status 03/30/2015 FINAL  Final  Culture, blood (routine x 2)     Status: None (Preliminary result)   Collection Time: 03/28/15  8:06 PM  Result Value Ref Range Status   Specimen Description BLOOD RIGHT HAND  Final   Special Requests BOTTLES DRAWN AEROBIC ONLY 5CC  Final   Culture NO GROWTH 3 DAYS  Final   Report Status PENDING  Incomplete  Culture, blood (routine x 2)     Status: None (Preliminary result)   Collection Time: 03/28/15  8:37 PM  Result Value Ref Range Status   Specimen Description BLOOD RIGHT HAND  Final   Special Requests BOTTLES DRAWN AEROBIC ONLY 5CC  Final   Culture NO GROWTH 3 DAYS  Final   Report Status PENDING  Incomplete    RADIOLOGY STUDIES/RESULTS: Dg Chest Port 1 View  03/27/2015   CLINICAL DATA:  Weakness, fatigue, shortness of breath for 2 days. Ex-smoker  EXAM: PORTABLE CHEST - 1 VIEW  COMPARISON:  None.  FINDINGS: There is marked elevation of the left hemidiaphragm, of uncertain acuity. Lungs appear to be at least mildly hyperexpanded suggesting some degree of COPD. Lungs appear clear. No evidence of pneumonia. No pleural effusion. No pneumothorax.  Osseous structures are unremarkable. Cardiomediastinal silhouette appears normal in size and configuration, although a portion of the left heart border is obscured by the left hemidiaphragm elevation.  IMPRESSION: Lungs appear hyperexpanded suggesting some degree of underlying COPD.  Lungs are clear.  No evidence of acute cardiopulmonary abnormality.  Marked elevation of the left hemidiaphragm, of uncertain acuity but probably chronic.   Electronically Signed   By: Franki Cabot M.D.   On: 03/27/2015 14:55    Oren Binet, MD  [facility] Pager:[phone]  If 7PM-7AM, please contact night-coverage [email or web address] Password [email or web address] 04/01/2015, 1:10 PM    LOS: 5 days

## 2015-04-01 NOTE — Consult Note (Signed)
[facility]  [phone]   HEMATOLOGY ONCOLOGY CONSULTATION   Benjamin Lawson  DOB: 20-Oct-1932  MR#: [id number]  CSN#: [id number]    Requesting Physician: [facility]    Patient Care Team: Leanna Battles, MD as PCP - General (Internal Medicine)  Reason for consult: Gastric Cancer       HPI:  81 y.o.  male admitted on 03/27/15 with hypotension after taking antihypertensives,  in the setting of 2-3 week history of poor oral intake with subsequent weight loss. He did not report nausea, heartburn or change in bowel habits, but did notice melanotic stools. No respiratory complaints. No chest pain. No dysuria or hematuria. No skin rashes, or neuropathy. He did notice dizziness as well without syncopal episodes. At the time of evaluation patient is confused, history is obtained from chart. Hb on admission was 7.0 requiring transfusion. GI consultation as upper GI bleed was suspected.  EGD on 7/29 showed near circumferential mass in the cardia. 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 Benjamin Lawson is the probable cause of his UGI bleed causing anemia and melena. Diagnosis ([id number]) was consistent with Adenocarcinoma. . Staging CTs pending. No family history of GI cancer. Last colonoscopy prior to this admission was done remotely. No history of  Diabetes.Remote history ofTobacco. Possible history of ETOH.  We were kindly requested to see the patient with recommendations.   Past medical history:      Past Medical History  Diagnosis Date  . Hypertension   . Hypercholesterolemia   . Chronic kidney disease (CKD), stage II (mild)   . Deafness in left ear   . Peripheral vascular disease   . Prostate cancer     Past surgical history:      Past Surgical History  Procedure Laterality Date  . Multiple tooth extractions  01/2015    "all my lower teeth"  . Transurethral resection of prostate  1990's  . Esophagogastroduodenoscopy N/A 03/28/2015   Procedure: ESOPHAGOGASTRODUODENOSCOPY (EGD);  Surgeon: Richmond Campbell, MD;  Location[facility] ENDOSCOPY;  Service: Endoscopy;  Laterality: N/A;    Medications:  Prior to Admission:  Prescriptions prior to admission  Medication Sig Dispense Refill Last Dose  . atorvastatin (LIPITOR) 20 MG tablet Take 20 mg by mouth daily.   03/27/2015 at Unknown time  . lactose free nutrition (BOOST) LIQD Take 237 mLs by mouth 2 (two) times daily with a meal.   03/27/2015 at Unknown time  . metoprolol succinate (TOPROL-XL) 50 MG 24 hr tablet Take 50 mg by mouth daily. Take with or immediately following a meal.   03/27/2015 at 1006  . sennosides-docusate sodium (SENOKOT-S) 8.6-50 MG tablet Take 1 tablet by mouth daily.   03/27/2015 at Unknown time  . telmisartan-hydrochlorothiazide (MICARDIS HCT) 80-12.5 MG per tablet Take 1 tablet by mouth daily.   03/27/2015 at Unknown time  . tolterodine (DETROL LA) 4 MG 24 hr capsule Take 4 mg by mouth daily.   03/27/2015 at Unknown time    YQI:[id number], LORazepam, ondansetron (ZOFRAN) IV  Allergies: No Known Allergies  Family history: Unable to obtain details due to confusion.   History reviewed. No pertinent family history.                                          Social history: History   Social History  . Marital Status: Widowed    Spouse Name: N/A  .  Number of Children: N/A  . Years of Education: N/A   Occupational History  .  retired from the [employer] and [employer]  . Smoking status: Former Smoker -- 0.50 packs/day for 5 years    Types: Cigarettes  . Smokeless tobacco: Never Used     Comment: "stopped smoking in the 1960's  . Alcohol Use: 1.2 oz/week    2 Shots of liquor per week     Comment: 03/27/2015 "6 pack of [employer] royals/month"  . Drug Use: No  . Sexual Activity: No   Other Topics Concern  . Not on file   Social History Narrative    ROS: Anorexia, weight loss, "weakness ", dark stools A complete  review of systems is otherwise negative  Physical Exam    Filed Vitals:   04/01/15 0601  BP: 108/74  Pulse: 93  Temp: 98.9 F (37.2 C)  Resp: 18   Filed Weights   03/30/15 0445 03/31/15 0500 04/01/15 0601  Weight: 186 lb 11.2 oz (84.687 kg) 188 lb 11.2 oz (85.594 kg) 195 lb 4.8 oz (88.587 kg)    GENERAL:awake, confused SKIN: skin color, texture, turgor are normal, no rashes or significant lesions EYES: normal, conjunctiva are pink and non-injected, sclera clear OROPHARYNX:no exudate, no erythema and lips, buccal mucosa, and tongue normal  NECK: supple, thyroid normal size, non-tender, without nodularity LYMPH:  no palpable lymphadenopathy in the cervical, supraclavicular, axillary or inguinal chains LUNGS: Decreased breath sounds at the lower posterior chest bilaterally HEART: regular rate & rhythm and no murmurs and no lower extremity edema ABDOMEN soft, non-tender and normal bowel sounds, no hepatosplenomegaly, no mass GU: Testes without mass Musculoskeletal:no cyanosis of digits and no clubbing  PSYCH: alert with fluent speech, not oriented NEURO: no focal motor/sensory deficits   Lab results:       CBC  Recent Labs Lab 03/27/15 1442 03/28/15 0340 03/28/15 0427  03/28/15 2257 03/29/15 0652 03/30/15 0317 03/31/15 0332 04/01/15 0432  WBC 7.5 7.8 6.7  --   --   --  9.7 8.7 7.8  HGB 8.8* 7.4* 7.0*  < > 10.7* 9.9* 10.0* 9.5* 9.8*  HCT 27.3* 23.5* 21.9*  < > 32.0* 29.3* 30.0* 28.8* 30.2*  PLT 360 273 275  --   --   --  230 235 265  MCV 90.1 91.4 90.1  --   --   --  88.2 88.9 88.3  MCH 29.0 28.8 28.8  --   --   --  29.4 29.3 28.7  MCHC 32.2 31.5 32.0  --   --   --  33.3 33.0 32.5  RDW 14.2 14.5 14.3  --   --   --  15.7* 15.6* 15.6*  LYMPHSABS 1.0  --   --   --   --   --   --   --   --   MONOABS 0.5  --   --   --   --   --   --   --   --   EOSABS 0.0  --   --   --   --   --   --   --   --   BASOSABS 0.0  --   --   --   --   --   --   --   --   < > = values in  this interval not displayed.  Anemia panel:  No results for input(s): VITAMINB12,  FOLATE, FERRITIN, TIBC, IRON, RETICCTPCT in the last 72 hours.   Chemistries   Recent Labs Lab 03/28/15 0427 03/29/15 0827 03/30/15 0317 03/31/15 0332 04/01/15 0432  NA 141 142 143 140 140  K 4.6 4.1 4.6 4.2 4.2  CL 115* 118* 123* 120* 116*  CO2 21* 17* 17* 17* 19*  GLUCOSE 99 107* 104* 114* 94  BUN 49* 32* 25* 20 20  CREATININE 2.32* 1.71* 1.50* 1.28* 1.34*  CALCIUM 8.2* 7.9* 7.7* 7.5* 7.9*     Coagulation profile  Recent Labs Lab 03/27/15 1442 03/28/15 0427  INR 1.02 1.08      Studies:      Dg Chest Port 1 View  03/27/2015   CLINICAL DATA:  Weakness, fatigue, shortness of breath for 2 days. Ex-smoker  EXAM: PORTABLE CHEST - 1 VIEW  COMPARISON:  None.  FINDINGS: There is marked elevation of the left hemidiaphragm, of uncertain acuity. Lungs appear to be at least mildly hyperexpanded suggesting some degree of COPD. Lungs appear clear. No evidence of pneumonia. No pleural effusion. No pneumothorax.  Osseous structures are unremarkable. Cardiomediastinal silhouette appears normal in size and configuration, although a portion of the left heart border is obscured by the left hemidiaphragm elevation.  IMPRESSION: Lungs appear hyperexpanded suggesting some degree of underlying COPD.  Lungs are clear.  No evidence of acute cardiopulmonary abnormality.  Marked elevation of the left hemidiaphragm, of uncertain acuity but probably chronic.   Electronically Signed   By: Franki Cabot M.D.   On: 03/27/2015 14:55     Assessment/Plan:82 y.o. male  with   Adenocarcinoma Gastric cardia mass EGD on 7/29 showed near circumferential mass in the cardia. The proximal stomach malignant appearing mass was biopsied  Diagnosis ([id number]) was consistent with Adenocarcinoma.   Staging CTs pending. Will follow results .  Will return Thursday, August 4th at which time dr. Benay Spice to discuss with patient and  family findings and recommendations  Anemia in neoplastic disease and GI bleed   DVT prophylaxis On mechanical devices   Full Code  Altered mental status-likely hospital delirium  Other medical issues as per admitting team.  Rondel Jumbo, PA-C 04/01/2015   Benjamin Lawson presented with symptomatic anemia. He has gastrointestinal bleeding secondary to a gastric cardia mass. A biopsy of the gastric mass confirmed adenocarcinoma. Mr. Obst is confused this morning and no family members are present.  There is no clinical evidence of distant metastatic disease. If there is no distant disease on the staging CT evaluation we can consider treatment with chemotherapy and radiation.  Recommendations: 1. Staging CTs of the chest, abdomen, and pelvis-can be performed without IV contrast if his renal function remains diminished 2. Radiation oncology consult 3. Check for HER-2 amplification on the gastric biopsy 4. Check CEA 5. I will follow-up on the staging CT scans and see him in the early a.m. on 04/03/2015

## 2015-04-01 NOTE — Progress Notes (Signed)
Pt called me into room and said everything was sideways. Tilted a cup on table at about 30 degrees and said it was now straight. Pt has been disoriented to place, but this level of disorientation is new. Had weak but equal hand grips, able to hold arms up for 10 seconds bilateral and his eyes were able to track on a moving finger. Spoke with on-call, mentioned new neuro symptoms, told to keep further watch on them but no stat action is needed at this level, but that she would mention to attending in am. Will continue to monitor

## 2015-04-02 ENCOUNTER — Inpatient Hospital Stay (HOSPITAL_COMMUNITY): Payer: Medicare Other

## 2015-04-02 ENCOUNTER — Ambulatory Visit
Admit: 2015-04-02 | Discharge: 2015-04-02 | Disposition: A | Discharge status: Home / Self Care | Payer: Medicare Other | Attending: Radiation Oncology | Admitting: Radiation Oncology

## 2015-04-02 LAB — CBC
HCT: 29.1 % — ABNORMAL LOW (ref 39.0–52.0)
Hemoglobin: 9.6 g/dL — ABNORMAL LOW (ref 13.0–17.0)
MCH: 29.3 pg (ref 26.0–34.0)
MCHC: 33 g/dL (ref 30.0–36.0)
MCV: 88.7 fL (ref 78.0–100.0)
PLATELETS: 279 10*3/uL (ref 150–400)
RBC: 3.28 MIL/uL — AB (ref 4.22–5.81)
RDW: 15.4 % (ref 11.5–15.5)
WBC: 7.6 10*3/uL (ref 4.0–10.5)

## 2015-04-02 LAB — CULTURE, BLOOD (ROUTINE X 2)
Culture: NO GROWTH
Culture: NO GROWTH

## 2015-04-02 LAB — BASIC METABOLIC PANEL
Anion gap: 5 (ref 5–15)
BUN: 20 mg/dL (ref 6–20)
CO2: 21 mmol/L — AB (ref 22–32)
CREATININE: 1.28 mg/dL — AB (ref 0.61–1.24)
Calcium: 7.9 mg/dL — ABNORMAL LOW (ref 8.9–10.3)
Chloride: 113 mmol/L — ABNORMAL HIGH (ref 101–111)
GFR calc Af Amer: 59 mL/min — ABNORMAL LOW (ref >60)
GFR calc non Af Amer: 51 mL/min — ABNORMAL LOW (ref >60)
Glucose, Bld: 95 mg/dL (ref 65–99)
Potassium: 4.4 mmol/L (ref 3.5–5.1)
Sodium: 139 mmol/L (ref 135–145)

## 2015-04-02 LAB — CEA: CEA: 12.2 ng/mL — ABNORMAL HIGH (ref 0.0–4.7)

## 2015-04-02 MED ORDER — TECHNETIUM TC 99M DIETHYLENETRIAME-PENTAACETIC ACID: 6.0000 | Freq: Once | INTRAVENOUS | Status: AC | PRN

## 2015-04-02 MED ORDER — POLYETHYLENE GLYCOL 3350 17 G PO PACK
17.0000 g | PACK | Freq: Two times a day (BID) | ORAL | Status: DC
  Administered 2015-04-02 – 2015-04-04 (×5): 17 g via ORAL
  Filled 2015-04-02 (×5): qty 1

## 2015-04-02 MED ORDER — POLYETHYLENE GLYCOL 3350 17 G PO PACK: 17.0000 g | PACK | Freq: Three times a day (TID) | ORAL | Status: DC

## 2015-04-02 MED ORDER — TECHNETIUM TO 99M ALBUMIN AGGREGATED
6.0000 | Freq: Once | INTRAVENOUS | Status: AC | PRN
  Administered 2015-04-02: 6 via INTRAVENOUS

## 2015-04-02 NOTE — Progress Notes (Addendum)
PATIENT DETAILS Name: Benjamin Lawson Age: 81 y.o. Sex: male Date of Birth: [birth year] Admit Date: 03/27/2015 Admitting Physician Truett Mainland, DO TIW:[id number],ASNKNL G, MD  Brief narrative:  81 year old male with history of hypertension and chronic kidney disease stage III admitted for evaluation of weakness, hypotension and acute renal failure. Further evaluation, reviewed hypotension secondary to acute blood loss anemia from upper GI bleed. Gastroenterology was consulted, patient underwent EGD which is now revealed gastric adenocarcinoma. Oncology consulted, staging CT ordered. Plans are for discharge to SNF in the next 1-2 days.  Subjective: Patient less confused and much more awake this morning  Assessment/Plan:   Hypotension: Resolved.Likely secondary to hypovolemia from GI bleed and continued use of anti-hypertensive medications. Blood pressure much better following IV fluid resuscitation and transfusion of 2 units of PRBC. . Follow closely.  Acute blood loss anemia: Secondary to Suspected upper GI bleed. Transfused 2 units of PRBC-following which Hb  9.6 .Follow hemoglobin closely.  Upper GI bleed: Presented with hypotension, worsening anemia and frank melanotic stools 1 post admission. Started on PPI, GI consulted-underwent EGD on 7/29-findings concerning for gastric cancer-confirmed adenocarcinoma by biopsy.Oncology consulted,   CT chest and abdomen without contrast shows fairly localised disease with peritoneal metastatic disease  . Oncology to provide further recommendations on 8/4 . MRI to r/o mets to the brain    Acute on chronic kidney disease stage III: Likely prerenal azotemia in a setting of acute blood loss and continued antihypertensives use (Micardis HCTZ). 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 No evidence of recurrence of melena-hemoglobin stable. Much better with low-dose metoprolol. Patient continues to be tachycardic. Will order a VQ scan to rule out PE  Coag neg staph bacteremia: Likely contamination-repeat blood culture negative. Discontinued vancomycin-and monitor off Abx  History of hypertension: start low dose metoprolol-BP more improved.   Known severe malnutrition: continue supplements  ? EtOH use: denies significant EtOH use to me-monitor for withdrawals.  Disposition: Discussed with daughter. She is not ready to have a palliative care discussion at this point. She is a Marine scientist and she is waiting for further recommendations from oncology to make an informed decision  Antimicrobial agents  See below  Anti-infectives    Start     Dose/Rate Route Frequency Ordered Stop   03/28/15 2200  vancomycin (VANCOCIN) IVPB 750 mg/150 ml premix  Status:  Discontinued     750 mg 150 mL/hr over 60 Minutes Intravenous Every 24 hours 03/28/15 2114 03/30/15 1320      DVT Prophylaxis: SCD's  Code Status: Full code -  Family Communication Daughter over the phone  Procedures: None  CONSULTS:  GI   Oncology  Time spent 25 minutes-Greater than 50% of this time was spent in counseling, explanation of diagnosis, planning of further management, and coordination of care.  MEDICATIONS: Scheduled Meds: . antiseptic oral rinse  7 mL Mouth Rinse q12n4p  . atorvastatin  20 mg Oral q1800  . chlorhexidine  15 mL Mouth Rinse BID  . feeding supplement (ENSURE ENLIVE)  237 mL Oral BID BM  . fesoterodine  4 mg Oral Daily  . metoprolol tartrate  25 mg Oral BID  . pantoprazole  40 mg Oral  BID AC   Continuous Infusions: . sodium chloride 10 mL/hr at 04/01/15 1427   PRN Meds:.acetaminophen, LORazepam, ondansetron (ZOFRAN)  IV    PHYSICAL EXAM: Vital signs in last 24 hours: Filed Vitals:   04/01/15 1021 04/01/15 1353 04/01/15 2027 04/02/15 0500  BP: 111/70 105/65 110/69 100/55  Pulse:  93 110 90  Temp:  98.2 F (36.8 C) 98 F (36.7 C) 98.5 F (36.9 C)  TempSrc:  Oral Oral Oral  Resp:      Height:      Weight:    86.818 kg (191 lb 6.4 oz)  SpO2:  99% 95% 94%    Weight change: -1.769 kg (-3 lb 14.4 oz) Filed Weights   03/31/15 0500 04/01/15 0601 04/02/15 0500  Weight: 85.594 kg (188 lb 11.2 oz) 88.587 kg (195 lb 4.8 oz) 86.818 kg (191 lb 6.4 oz)   Body mass index is 26.71 kg/(m^2).   Gen Exam: Awake and alert with clear speech.  He looks chronically ill looking and pale. Neck: Supple, No JVD.   Chest: B/L Clear.  No rales or rhonchi CVS: S1 S2 Regular, no murmurs.  Abdomen: soft, BS +, non tender, non distended.  Extremities: no edema, lower extremities warm to touch. Neurologic: Non Focal.   Skin: No Rash.   Wounds: N/A.    Intake/Output from previous day:  Intake/Output Summary (Last 24 hours) at 04/02/15 1336 Last data filed at 04/02/15 0912  Gross per 24 hour  Intake 392.67 ml  Output    225 ml  Net 167.67 ml     LAB RESULTS: CBC  Recent Labs Lab 03/27/15 1442  03/28/15 0427  03/29/15 0652 03/30/15 0317 03/31/15 0332 04/01/15 0432 04/02/15 0336  WBC 7.5  < > 6.7  --   --  9.7 8.7 7.8 7.6  HGB 8.8*  < > 7.0*  < > 9.9* 10.0* 9.5* 9.8* 9.6*  HCT 27.3*  < > 21.9*  < > 29.3* 30.0* 28.8* 30.2* 29.1*  PLT 360  < > 275  --   --  230 235 265 279  MCV 90.1  < > 90.1  --   --  88.2 88.9 88.3 88.7  MCH 29.0  < > 28.8  --   --  29.4 29.3 28.7 29.3  MCHC 32.2  < > 32.0  --   --  33.3 33.0 32.5 33.0  RDW 14.2  < > 14.3  --   --  15.7* 15.6* 15.6* 15.4  LYMPHSABS 1.0  --   --   --   --   --   --   --   --   MONOABS 0.5  --   --   --   --   --   --   --   --   EOSABS 0.0  --   --   --   --   --   --   --   --   BASOSABS 0.0  --   --   --   --   --   --   --   --   < > = values  in this interval not displayed.  Chemistries   Recent Labs Lab 03/29/15 0827 03/30/15 0317 03/31/15 0332 04/01/15 0432 04/02/15 0336  NA 142 143 140 140 139  K 4.1 4.6 4.2 4.2 4.4  CL 118* 123* 120* 116* 113*  CO2 17* 17* 17* 19* 21*  GLUCOSE 107* 104* 114* 94 95  BUN 32*  25* 20 20 20   CREATININE 1.71* 1.50* 1.28* 1.34* 1.28*  CALCIUM 7.9* 7.7* 7.5* 7.9* 7.9*    CBG: No results for input(s): GLUCAP in the last 168 hours.  GFR Estimated Creatinine Clearance: 49 mL/min (by C-G formula based on Cr of 1.28).  Coagulation profile  Recent Labs Lab 03/27/15 1442 03/28/15 0427  INR 1.02 1.08    Cardiac Enzymes No results for input(s): CKMB, TROPONINI, MYOGLOBIN in the last 168 hours.  Invalid input(s): CK  Invalid input(s): POCBNP No results for input(s): DDIMER in the last 72 hours. No results for input(s): HGBA1C in the last 72 hours. No results for input(s): CHOL, HDL, LDLCALC, TRIG, CHOLHDL, LDLDIRECT in the last 72 hours. No results for input(s): TSH, T4TOTAL, T3FREE, THYROIDAB in the last 72 hours.  Invalid input(s): FREET3 No results for input(s): VITAMINB12, FOLATE, FERRITIN, TIBC, IRON, RETICCTPCT in the last 72 hours. No results for input(s): LIPASE, AMYLASE in the last 72 hours.  Urine Studies No results for input(s): UHGB, CRYS in the last 72 hours.  Invalid input(s): UACOL, UAPR, USPG, UPH, UTP, UGL, UKET, UBIL, UNIT, UROB, ULEU, UEPI, UWBC, URBC, UBAC, CAST, UCOM, BILUA  MICROBIOLOGY: Recent Results (from the past 240 hour(s))  Culture, blood (routine x 2)     Status: None   Collection Time: 03/27/15  4:56 PM  Result Value Ref Range Status   Specimen Description BLOOD LEFT ARM  Final   Special Requests BOTTLES DRAWN AEROBIC AND ANAEROBIC 5CC  Final   Culture  Setup Time   Final    GRAM POSITIVE COCCI IN CLUSTERS AEROBIC BOTTLE ONLY CRITICAL RESULT CALLED TO, READ BACK BY AND VERIFIED WITH: J KOME 03/28/15 @ 46 M VESTAL    Culture   Final     STAPHYLOCOCCUS SPECIES (COAGULASE NEGATIVE) THE SIGNIFICANCE OF ISOLATING THIS ORGANISM FROM A SINGLE SET OF BLOOD CULTURES WHEN MULTIPLE SETS ARE DRAWN IS UNCERTAIN. PLEASE NOTIFY THE MICROBIOLOGY DEPARTMENT WITHIN ONE WEEK IF SPECIATION AND SENSITIVITIES ARE REQUIRED.    Report Status 03/30/2015 FINAL  Final  Culture, blood (routine x 2)     Status: None (Preliminary result)   Collection Time: 03/28/15  8:06 PM  Result Value Ref Range Status   Specimen Description BLOOD RIGHT HAND  Final   Special Requests BOTTLES DRAWN AEROBIC ONLY 5CC  Final   Culture NO GROWTH 4 DAYS  Final   Report Status PENDING  Incomplete  Culture, blood (routine x 2)     Status: None (Preliminary result)   Collection Time: 03/28/15  8:37 PM  Result Value Ref Range Status   Specimen Description BLOOD RIGHT HAND  Final   Special Requests BOTTLES DRAWN AEROBIC ONLY 5CC  Final   Culture NO GROWTH 4 DAYS  Final   Report Status PENDING  Incomplete    RADIOLOGY STUDIES/RESULTS: Ct Abdomen Pelvis Wo Contrast  04/01/2015   CLINICAL DATA:  Adenocarcinoma of stomach:confirmed by endoscopy with biopsy.Oncology consulted, unfortunately looks like patient has stage III chronic kidney disease, hence will order CT chest and abdomen without contrast. Presented with hypotension, worsening anemia and frank melanotic stools 1 post admission. Started on PPI, GI consulted-underwent EGD on 7/29-findings concerning for gastric cancer-confirmed adenocarcinoma by biopsy.  EXAM: CT CHEST, ABDOMEN AND PELVIS WITHOUT CONTRAST  TECHNIQUE: Multidetector CT imaging of the chest, abdomen and pelvis was performed following the standard protocol without IV contrast.  COMPARISON:  None.  FINDINGS: CT CHEST FINDINGS  Thoracic inlet: No mass or adenopathy. Thyroid gland is unremarkable.  Mediastinum and hila: Heart normal in size. Mild coronary artery calcifications. Great vessels are normal in caliber for age. Mild atherosclerotic calcifications along  the thoracic aorta and at the origins of the aortic branch vessels. No mediastinal or hilar masses or pathologically enlarged lymph nodes.  Lungs and pleura: Significant elevation of left hemidiaphragm. Small bilateral pleural effusions. There is dependent atelectasis mostly in the lower lobes. No lung mass or suspicious nodule. No lung consolidation or edema. Tracheobronchial tree is patent. Paraseptal emphysema noted in the upper lobes at the apices where there is also pleural parenchymal scarring.  CT ABDOMEN AND PELVIS FINDINGS  Liver: Scattered small low-density lesions, mostly subcentimeter. Largest cyst seen centrally measuring 13 mm. These are nonspecific. They may reflect cysts. Metastatic lesions are possible. No other liver abnormality.  Spleen, gallbladder, pancreas, adrenal glands:  Unremarkable.  Kidneys, ureters, bladder: No renal masses. Small nonobstructing stone seen in each kidney. Mild diffuse renal cortical thinning. No hydronephrosis. Chronic appearing bilateral perinephric stranding. Ureters normal course and in caliber. Bladder is unremarkable.  Prostate gland: Multiple radiation therapy seeds. Fat planes between the prostate, seminal vesicles and bladder are relatively well preserved.  Lymph nodes:  No pathologically enlarged lymph nodes.  Ascites:  Trace ascites evident in the posterior pelvic recess.  Gastrointestinal: Stomach shows irregular wall thickening. This is seen diffusely but is most prominent involving the gastric antrum porta measures 15 mm in thickness. No ulceration.  There is distention of most of the colon. The splenic flexure has a maximum diameter of 8.8 cm. No colonic wall thickening or adjacent inflammation is seen. There are loops of mildly dilated small bowel with other areas of decompressed small bowel. No discrete small bowel mass or wall thickening is seen.  Mesentery: Small soft tissue nodules are seen adjacent to the stomach. Some of the apparent nodules appear  to be prominent vascularity. Other discrete nodules may be due to prominent lymph nodes. Possibility of subtle peritoneal metastatic disease should be considered.  Vascular: Mild generalized ectasia of the abdominal aorta without a focal aneurysm. There is atherosclerotic calcification along the abdominal aorta and its branch vessels.  MUSCULOSKELETAL  No osteoblastic or osteolytic lesions.  MISCELLANEOUS  There is generalized soft tissue edema most evident in the lower abdomen and pelvis.  IMPRESSION: 1. Diffuse wall thickening of the stomach consistent with the given history of gastric adenocarcinoma. No definite CT evidence of extension of neoplastic disease beyond the stomach serosa. 2. Distention of the majority of the colon and multiple loops of small bowel with other areas of small bowel being decompressed. This may reflect an adynamic ileus. No discrete level of obstruction is seen. 3. Small nodular areas of soft tissue seen adjacent to the stomach. This could reflect local peritoneal metastatic disease or sub cm neoplastic adenopathy. 4. No evidence of metastatic disease in the chest. There small pleural effusions with associated dependent atelectasis. 5. Small hypo attenuating liver lesions. These are nonspecific. They may all be due to cysts. Metastatic disease is possible. 6. No other evidence of metastatic disease.   Electronically Signed   By: Lajean Manes M.D.   On: 04/01/2015 19:37   Ct Head Wo Contrast  04/01/2015   CLINICAL DATA:  Confusion/ altered mental status for 1 day  EXAM: CT HEAD WITHOUT CONTRAST  TECHNIQUE: Contiguous axial images were obtained from the base of the skull through the vertex without intravenous contrast.  COMPARISON:  None.  FINDINGS: There is moderate diffuse atrophy. There is no intracranial  mass, hemorrhage, extra-axial fluid collection, or midline shift. There is moderate small vessel disease throughout the centra semiovale bilaterally. There is no acute infarct  evident. The bony calvarium appears intact. The mastoid air cells are clear.  IMPRESSION: Atrophy with periventricular small vessel disease. No acute infarct evident. No hemorrhage or mass effect.   Electronically Signed   By: Lowella Grip III M.D.   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 Presented with hypotension, worsening anemia and frank melanotic stools 1 post admission. Started on PPI, GI consulted-underwent EGD on 7/29-findings concerning for gastric cancer-confirmed adenocarcinoma by biopsy.  EXAM: CT CHEST, ABDOMEN AND PELVIS WITHOUT CONTRAST  TECHNIQUE: Multidetector CT imaging of the chest, abdomen and pelvis was performed following the standard protocol without IV contrast.  COMPARISON:  None.  FINDINGS: CT CHEST FINDINGS  Thoracic inlet: No mass or adenopathy. Thyroid gland is unremarkable.  Mediastinum and hila: Heart normal in size. Mild coronary artery calcifications. Great vessels are normal in caliber for age. Mild atherosclerotic calcifications along the thoracic aorta and at the origins of the aortic branch vessels. No mediastinal or hilar masses or pathologically enlarged lymph nodes.  Lungs and pleura: Significant elevation of left hemidiaphragm. Small bilateral pleural effusions. There is dependent atelectasis mostly in the lower lobes. No lung mass or suspicious nodule. No lung consolidation or edema. Tracheobronchial tree is patent. Paraseptal emphysema noted in the upper lobes at the apices where there is also pleural parenchymal scarring.  CT ABDOMEN AND PELVIS FINDINGS  Liver: Scattered small low-density lesions, mostly subcentimeter. Largest cyst seen centrally measuring 13 mm. These are nonspecific. They may reflect cysts. Metastatic  lesions are possible. No other liver abnormality.  Spleen, gallbladder, pancreas, adrenal glands:  Unremarkable.  Kidneys, ureters, bladder: No renal masses. Small nonobstructing stone seen in each kidney. Mild diffuse renal cortical thinning. No hydronephrosis. Chronic appearing bilateral perinephric stranding. Ureters normal course and in caliber. Bladder is unremarkable.  Prostate gland: Multiple radiation therapy seeds. Fat planes between the prostate, seminal vesicles and bladder are relatively well preserved.  Lymph nodes:  No pathologically enlarged lymph nodes.  Ascites:  Trace ascites evident in the posterior pelvic recess.  Gastrointestinal: Stomach shows irregular wall thickening. This is seen diffusely but is most prominent involving the gastric antrum porta measures 15 mm in thickness. No ulceration.  There is distention of most of the colon. The splenic flexure has a maximum diameter of 8.8 cm. No colonic wall thickening or adjacent inflammation is seen. There are loops of mildly dilated small bowel with other areas of decompressed small bowel. No discrete small bowel mass or wall thickening is seen.  Mesentery: Small soft tissue nodules are seen adjacent to the stomach. Some of the apparent nodules appear to be prominent vascularity. Other discrete nodules may be due to prominent lymph nodes. Possibility of subtle peritoneal metastatic disease should be considered.  Vascular: Mild generalized ectasia of the abdominal aorta without a focal aneurysm. There is atherosclerotic calcification along the abdominal aorta and its branch vessels.  MUSCULOSKELETAL  No osteoblastic or osteolytic lesions.  MISCELLANEOUS  There is generalized soft tissue edema most evident in the lower abdomen and pelvis.  IMPRESSION: 1. Diffuse wall thickening of the stomach consistent with the given history of gastric adenocarcinoma. No definite CT evidence of  extension of neoplastic disease beyond the stomach serosa. 2.  Distention of the majority of the colon and multiple loops of small bowel with other areas of small bowel being decompressed. This may reflect an adynamic ileus. No discrete level of obstruction is seen. 3. Small nodular areas of soft tissue seen adjacent to the stomach. This could reflect local peritoneal metastatic disease or sub cm neoplastic adenopathy. 4. No evidence of metastatic disease in the chest. There small pleural effusions with associated dependent atelectasis. 5. Small hypo attenuating liver lesions. These are nonspecific. They may all be due to cysts. Metastatic disease is possible. 6. No other evidence of metastatic disease.   Electronically Signed   By: Lajean Manes M.D.   On: 04/01/2015 19:37   Dg Chest Port 1 View  04/02/2015   CLINICAL DATA:  Shortness of breath.  EXAM: PORTABLE CHEST - 1 VIEW  COMPARISON:  CT 04/01/2015.  Chest x-ray 03/27/2015.  FINDINGS: Mediastinum hilar structures normal. Lungs are clear. Elevation of the left hemidiaphragm again noted. Mild subsegmental atelectasis left lung base. Small left pleural effusion cannot be excluded. Interposition of the colon under the left hemidiaphragm again noted. No free air . No acute bony abnormality.  IMPRESSION: 1. Stable elevation hemidiaphragm . 2. Mild left base subsegmental atelectasis. Small left pleural effusion cannot be excluded .   Electronically Signed   By: Marcello Moores  Register   On: 04/02/2015 11:32   Dg Chest Port 1 View  03/27/2015   CLINICAL DATA:  Weakness, fatigue, shortness of breath for 2 days. Ex-smoker  EXAM: PORTABLE CHEST - 1 VIEW  COMPARISON:  None.  FINDINGS: There is marked elevation of the left hemidiaphragm, of uncertain acuity. Lungs appear to be at least mildly hyperexpanded suggesting some degree of COPD. Lungs appear clear. No evidence of pneumonia. No pleural effusion. No pneumothorax.  Osseous structures are unremarkable. Cardiomediastinal silhouette appears normal in size and configuration, although  a portion of the left heart border is obscured by the left hemidiaphragm elevation.  IMPRESSION: Lungs appear hyperexpanded suggesting some degree of underlying COPD.  Lungs are clear.  No evidence of acute cardiopulmonary abnormality.  Marked elevation of the left hemidiaphragm, of uncertain acuity but probably chronic.   Electronically Signed   By: Franki Cabot M.D.   On: 03/27/2015 14:55    Reyne Dumas, MD  [facility] Pager:[phone]  If 7PM-7AM, please contact night-coverage [email or web address] Password [email or web address] 04/02/2015, 1:36 PM   LOS: 6 days

## 2015-04-03 ENCOUNTER — Telehealth: Payer: Self-pay | Admitting: *Deleted

## 2015-04-03 DIAGNOSIS — N289 Disorder of kidney and ureter, unspecified: Secondary | ICD-10-CM

## 2015-04-03 DIAGNOSIS — C169 Malignant neoplasm of stomach, unspecified: Secondary | ICD-10-CM

## 2015-04-03 LAB — COMPREHENSIVE METABOLIC PANEL
ALBUMIN: 1.9 g/dL — AB (ref 3.5–5.0)
ALK PHOS: 41 U/L (ref 38–126)
ALT: 27 U/L (ref 17–63)
ANION GAP: 5 (ref 5–15)
AST: 30 U/L (ref 15–41)
BUN: 16 mg/dL (ref 6–20)
CHLORIDE: 111 mmol/L (ref 101–111)
CO2: 22 mmol/L (ref 22–32)
Calcium: 8.1 mg/dL — ABNORMAL LOW (ref 8.9–10.3)
Creatinine, Ser: 1.34 mg/dL — ABNORMAL HIGH (ref 0.61–1.24)
GFR calc Af Amer: 56 mL/min — ABNORMAL LOW (ref >60)
GFR, EST NON AFRICAN AMERICAN: 48 mL/min — AB (ref >60)
Glucose, Bld: 106 mg/dL — ABNORMAL HIGH (ref 65–99)
POTASSIUM: 4.8 mmol/L (ref 3.5–5.1)
SODIUM: 138 mmol/L (ref 135–145)
TOTAL PROTEIN: 4 g/dL — AB (ref 6.5–8.1)
Total Bilirubin: 0.3 mg/dL (ref 0.3–1.2)

## 2015-04-03 LAB — CBC
HEMATOCRIT: 28.3 % — AB (ref 39.0–52.0)
HEMOGLOBIN: 9.2 g/dL — AB (ref 13.0–17.0)
MCH: 28.8 pg (ref 26.0–34.0)
MCHC: 32.5 g/dL (ref 30.0–36.0)
MCV: 88.7 fL (ref 78.0–100.0)
PLATELETS: 286 10*3/uL (ref 150–400)
RBC: 3.19 MIL/uL — ABNORMAL LOW (ref 4.22–5.81)
RDW: 15.2 % (ref 11.5–15.5)
WBC: 7.8 10*3/uL (ref 4.0–10.5)

## 2015-04-03 MED ORDER — PANTOPRAZOLE SODIUM 40 MG PO TBEC: 40.0000 mg | DELAYED_RELEASE_TABLET | Freq: Two times a day (BID) | ORAL | Status: AC

## 2015-04-03 NOTE — Care Management Important Message (Signed)
Important Message  Patient Details  Name: TOWNES FUHS MRN: [id number] Date of Birth: 1932/10/02   Medicare Important Message Given:  Yes-third notification given    Pricilla Handler 04/03/2015, 12:45 PM

## 2015-04-03 NOTE — Care Management Important Message (Signed)
Important Message  Patient Details  Name: Benjamin Lawson MRN: [id number] Date of Birth: 03/29/1933   Medicare Important Message Given:  Yes-third notification given    Pricilla Handler 04/03/2015, 12:44 PM

## 2015-04-03 NOTE — Telephone Encounter (Signed)
Patient's daughter would like to speak with Dr Benay Spice. Please call Rockwell Zentz at [phone]

## 2015-04-03 NOTE — Consult Note (Signed)
Radiation Oncology         ([phone]) [phone] ________________________________  Name: JOBY RICHART MRN: [id number]  Date: 03/27/2015  DOB: 1932/09/03  AS:[id number],HALPFX G, MD  No ref. provider found     REFERRING PHYSICIAN: No ref. provider found   DIAGNOSIS: gastric cancer  HISTORY OF PRESENT ILLNESS::Benjamin Lawson is a 81 y.o. male who is seen for an initial consultation visit regarding the patient's diagnosis of hypotension, poor po intake and associated weight loss. His evaluation revealed anemia with a Hb of 7.0 with subsequent transfusion. GI consultation was initiated with suspected GI bleed. Upper endoscopy showed a near circumferential mass within the gastric cardia. Biopsy returned positive for adenocarcinoma.  Staging CT scans revealed gastric thickening. Small nonspecific lesions were seen within the liver and potential lymphadenopathy was seen adjacent to the stomach. This could also represent early peritoneal disease. MRI of the head did not reveal intracranial disease.  The patient was alert today, able to converse with me regardinging his case. He did mention continued visual hallucinations.  PREVIOUS RADIATION THERAPY: no known history   PAST MEDICAL HISTORY:  has a past medical history of Hypertension; Hypercholesterolemia; Chronic kidney disease (CKD), stage II (mild); Deafness in left ear; Peripheral vascular disease; and Prostate cancer.     PAST SURGICAL HISTORY: Past Surgical History  Procedure Laterality Date  . Multiple tooth extractions  01/2015    "all my lower teeth"  . Transurethral resection of prostate  1990's  . Esophagogastroduodenoscopy N/A 03/28/2015    Procedure: ESOPHAGOGASTRODUODENOSCOPY (EGD);  Surgeon: Richmond Campbell, MD;  Location: [facility] ENDOSCOPY;  Service: Endoscopy;  Laterality: N/A;     FAMILY HISTORY: family history is not on file.   SOCIAL HISTORY:  reports that he has quit smoking. His smoking use included Cigarettes. He has a 2.5  pack-year smoking history. He has never used smokeless tobacco. He reports that he drinks about 1.2 oz of alcohol per week. He reports that he does not use illicit drugs.   ALLERGIES: Review of patient's allergies indicates no known allergies.   MEDICATIONS:  Current Facility-Administered Medications  Medication Dose Route Frequency Provider Last Rate Last Dose  . 0.9 %  sodium chloride infusion   Intravenous Continuous Jonetta Osgood, MD 10 mL/hr at 04/01/15 1427    . acetaminophen (TYLENOL) tablet 650 mg  650 mg Oral Q4H PRN Tanna Savoy Stinson, DO      . antiseptic oral rinse (CPC / CETYLPYRIDINIUM CHLORIDE 0.05%) solution 7 mL  7 mL Mouth Rinse q12n4p Tanna Savoy Stinson, DO   7 mL at 04/01/15 1600  . atorvastatin (LIPITOR) tablet 20 mg  20 mg Oral q1800 Tanna Savoy Stinson, DO   20 mg at 04/02/15 1800  . chlorhexidine (PERIDEX) 0.12 % solution 15 mL  15 mL Mouth Rinse BID Tanna Savoy Stinson, DO   15 mL at 04/02/15 2012  . feeding supplement (ENSURE ENLIVE) (ENSURE ENLIVE) liquid 237 mL  237 mL Oral BID BM Tanna Savoy Stinson, DO   237 mL at 04/02/15 1500  . fesoterodine (TOVIAZ) tablet 4 mg  4 mg Oral Daily Tanna Savoy Stinson, DO   4 mg at 04/02/15 1139  . LORazepam (ATIVAN) tablet 1-2 mg  1-2 mg Oral Q1H PRN Tanna Savoy Stinson, DO      . metoprolol tartrate (LOPRESSOR) tablet 25 mg  25 mg Oral BID Jonetta Osgood, MD   25 mg at 04/02/15 2157  . ondansetron (ZOFRAN) injection 4 mg  4 mg Intravenous  Q6H PRN Tanna Savoy Stinson, DO      . pantoprazole (PROTONIX) EC tablet 40 mg  40 mg Oral BID AC Wendee Beavers, RPH   40 mg at 04/02/15 1630  . polyethylene glycol (MIRALAX / GLYCOLAX) packet 17 g  17 g Oral BID Reyne Dumas, MD   17 g at 04/02/15 2200     REVIEW OF SYSTEMS:  A 15 point review of systems is documented in the electronic medical record. This was obtained by the nursing staff. However, I reviewed this with the patient to discuss relevant findings and make appropriate changes.  Pertinent items are noted  in HPI.    PHYSICAL EXAM:  height is 5\' 11"  (1.803 m) and weight is 188 lb 6.4 oz (85.458 kg). His oral temperature is 98.3 F (36.8 C). His blood pressure is 95/60 and his pulse is 87. His respiration is 18 and oxygen saturation is 94%.   ECOG = 2  0 - Asymptomatic (Fully active, able to carry on all predisease activities without restriction)  1 - Symptomatic but completely ambulatory (Restricted in physically strenuous activity but ambulatory and able to carry out work of a light or sedentary nature. For example, light housework, office work)  2 - Symptomatic, <50% in bed during the day (Ambulatory and capable of all self care but unable to carry out any work activities. Up and about more than 50% of waking hours)  3 - Symptomatic, >50% in bed, but not bedbound (Capable of only limited self-care, confined to bed or chair 50% or more of waking hours)  4 - Bedbound (Completely disabled. Cannot carry on any self-care. Totally confined to bed or chair)  5 - Death   Eustace Pen MM, Creech RH, Tormey DC, et al. [phone]). "Toxicity and response criteria of the [facility]". [place] Oncol. 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 Presented with hypotension, worsening anemia and frank melanotic stools 1  post admission. Started on PPI, GI consulted-underwent EGD on 7/29-findings concerning for gastric cancer-confirmed adenocarcinoma by biopsy.  EXAM: CT CHEST, ABDOMEN AND PELVIS WITHOUT CONTRAST  TECHNIQUE: Multidetector CT imaging of the chest, abdomen and pelvis was performed following the standard protocol without IV contrast.  COMPARISON:  None.  FINDINGS: CT CHEST FINDINGS  Thoracic inlet: No mass or adenopathy. Thyroid gland is unremarkable.  Mediastinum and hila: Heart normal in size. Mild coronary artery calcifications. Great vessels are normal in caliber for age. Mild atherosclerotic calcifications along the thoracic aorta and at the origins of the aortic branch vessels. No mediastinal or hilar masses or pathologically enlarged lymph nodes.  Lungs and pleura: Significant elevation of left hemidiaphragm. Small bilateral pleural effusions. There is dependent atelectasis mostly  in the lower lobes. No lung mass or suspicious nodule. No lung consolidation or edema. Tracheobronchial tree is patent. Paraseptal emphysema noted in the upper lobes at the apices where there is also pleural parenchymal scarring.  CT ABDOMEN AND PELVIS FINDINGS  Liver: Scattered small low-density lesions, mostly subcentimeter. Largest cyst seen centrally measuring 13 mm. These are nonspecific. They may reflect cysts. Metastatic lesions are possible. No other liver abnormality.  Spleen, gallbladder, pancreas, adrenal glands:  Unremarkable.  Kidneys, ureters, bladder: No renal masses. Small nonobstructing stone seen in each kidney. Mild diffuse renal cortical thinning. No hydronephrosis. Chronic appearing bilateral perinephric stranding. Ureters normal course and in caliber. Bladder is unremarkable.  Prostate gland: Multiple radiation therapy seeds. Fat planes between the prostate, seminal vesicles and bladder are relatively well preserved.  Lymph nodes:  No pathologically enlarged lymph nodes.  Ascites:  Trace ascites evident in the  posterior pelvic recess.  Gastrointestinal: Stomach shows irregular wall thickening. This is seen diffusely but is most prominent involving the gastric antrum porta measures 15 mm in thickness. No ulceration.  There is distention of most of the colon. The splenic flexure has a maximum diameter of 8.8 cm. No colonic wall thickening or adjacent inflammation is seen. There are loops of mildly dilated small bowel with other areas of decompressed small bowel. No discrete small bowel mass or wall thickening is seen.  Mesentery: Small soft tissue nodules are seen adjacent to the stomach. Some of the apparent nodules appear to be prominent vascularity. Other discrete nodules may be due to prominent lymph nodes. Possibility of subtle peritoneal metastatic disease should be considered.  Vascular: Mild generalized ectasia of the abdominal aorta without a focal aneurysm. There is atherosclerotic calcification along the abdominal aorta and its branch vessels.  MUSCULOSKELETAL  No osteoblastic or osteolytic lesions.  MISCELLANEOUS  There is generalized soft tissue edema most evident in the lower abdomen and pelvis.  IMPRESSION: 1. Diffuse wall thickening of the stomach consistent with the given history of gastric adenocarcinoma. No definite CT evidence of extension of neoplastic disease beyond the stomach serosa. 2. Distention of the majority of the colon and multiple loops of small bowel with other areas of small bowel being decompressed. This may reflect an adynamic ileus. No discrete level of obstruction is seen. 3. Small nodular areas of soft tissue seen adjacent to the stomach. This could reflect local peritoneal metastatic disease or sub cm neoplastic adenopathy. 4. No evidence of metastatic disease in the chest. There small pleural effusions with associated dependent atelectasis. 5. Small hypo attenuating liver lesions. These are nonspecific. They may all be due to cysts. Metastatic disease is possible. 6. No other  evidence of metastatic disease.   Electronically Signed   By: Lajean Manes M.D.   On: 04/01/2015 19:37   Ct Head Wo Contrast  04/01/2015   CLINICAL DATA:  Confusion/ altered mental status for 1 day  EXAM: CT HEAD WITHOUT CONTRAST  TECHNIQUE: Contiguous axial images were obtained from the base of the skull through the vertex without intravenous contrast.  COMPARISON:  None.  FINDINGS: There is moderate diffuse atrophy. There is no intracranial mass, hemorrhage, extra-axial fluid collection, or midline shift. There is moderate small vessel disease throughout the centra semiovale bilaterally. There is no acute infarct evident. The bony calvarium appears intact. The mastoid air cells are clear.  IMPRESSION: Atrophy with periventricular small vessel disease. No acute infarct evident. No hemorrhage or mass effect.   Electronically Signed   By: Lowella Grip  III M.D.   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 Presented with hypotension, worsening anemia and frank melanotic stools 1 post admission. Started on PPI, GI consulted-underwent EGD on 7/29-findings concerning for gastric cancer-confirmed adenocarcinoma by biopsy.  EXAM: CT CHEST, ABDOMEN AND PELVIS WITHOUT CONTRAST  TECHNIQUE: Multidetector CT imaging of the chest, abdomen and pelvis was performed following the standard protocol without IV contrast.  COMPARISON:  None.  FINDINGS: CT CHEST FINDINGS  Thoracic inlet: No mass or adenopathy. Thyroid gland is unremarkable.  Mediastinum and hila: Heart normal in size. Mild coronary artery calcifications. Great vessels are normal in caliber for age. Mild atherosclerotic calcifications along the thoracic aorta and at the origins of the aortic branch vessels. No mediastinal or  hilar masses or pathologically enlarged lymph nodes.  Lungs and pleura: Significant elevation of left hemidiaphragm. Small bilateral pleural effusions. There is dependent atelectasis mostly in the lower lobes. No lung mass or suspicious nodule. No lung consolidation or edema. Tracheobronchial tree is patent. Paraseptal emphysema noted in the upper lobes at the apices where there is also pleural parenchymal scarring.  CT ABDOMEN AND PELVIS FINDINGS  Liver: Scattered small low-density lesions, mostly subcentimeter. Largest cyst seen centrally measuring 13 mm. These are nonspecific. They may reflect cysts. Metastatic lesions are possible. No other liver abnormality.  Spleen, gallbladder, pancreas, adrenal glands:  Unremarkable.  Kidneys, ureters, bladder: No renal masses. Small nonobstructing stone seen in each kidney. Mild diffuse renal cortical thinning. No hydronephrosis. Chronic appearing bilateral perinephric stranding. Ureters normal course and in caliber. Bladder is unremarkable.  Prostate gland: Multiple radiation therapy seeds. Fat planes between the prostate, seminal vesicles and bladder are relatively well preserved.  Lymph nodes:  No pathologically enlarged lymph nodes.  Ascites:  Trace ascites evident in the posterior pelvic recess.  Gastrointestinal: Stomach shows irregular wall thickening. This is seen diffusely but is most prominent involving the gastric antrum porta measures 15 mm in thickness. No ulceration.  There is distention of most of the colon. The splenic flexure has a maximum diameter of 8.8 cm. No colonic wall thickening or adjacent inflammation is seen. There are loops of mildly dilated small bowel with other areas of decompressed small bowel. No discrete small bowel mass or wall thickening is seen.  Mesentery: Small soft tissue nodules are seen adjacent to the stomach. Some of the apparent nodules appear to be prominent vascularity. Other discrete nodules may be due to prominent lymph  nodes. Possibility of subtle peritoneal metastatic disease should be considered.  Vascular: Mild generalized ectasia of the abdominal aorta without a focal aneurysm. There is atherosclerotic calcification along the abdominal aorta and its branch vessels.  MUSCULOSKELETAL  No osteoblastic or osteolytic lesions.  MISCELLANEOUS  There is generalized soft tissue edema most evident in the lower abdomen and pelvis.  IMPRESSION: 1. Diffuse wall thickening of the stomach consistent with the given history of gastric adenocarcinoma. No definite CT evidence of extension of neoplastic disease beyond the stomach serosa. 2. Distention of the majority of the colon and multiple loops of small bowel with other areas of small bowel being decompressed. This may reflect an adynamic ileus. No discrete level of obstruction is seen. 3. Small nodular areas of soft tissue seen adjacent to the stomach. This could reflect local peritoneal metastatic disease or  sub cm neoplastic adenopathy. 4. No evidence of metastatic disease in the chest. There small pleural effusions with associated dependent atelectasis. 5. Small hypo attenuating liver lesions. These are nonspecific. They may all be due to cysts. Metastatic disease is possible. 6. No other evidence of metastatic disease.   Electronically Signed   By: Lajean Manes M.D.   On: 04/01/2015 19:37   Mr Brain Wo Contrast  04/02/2015   CLINICAL DATA:  Confusion.  Weakness.  Gastric adenocarcinoma.  EXAM: MRI HEAD WITHOUT CONTRAST  TECHNIQUE: Multiplanar, multiecho pulse sequences of the brain and surrounding structures were obtained without intravenous contrast.  COMPARISON:  CT without contrast 04/01/2015.  FINDINGS: Moderate generalized atrophy and diffuse white matter changes are noted bilaterally. No acute infarct, hemorrhage, or mass is present. The ventricles are proportionate to the degree of atrophy.  Flow is present in the major intracranial arteries. The globes and orbits are intact.  The paranasal sinuses are clear. Mastoid air cells are clear.  Skullbase is within normal limits. Midline structures are unremarkable.  IMPRESSION: 1. No acute intracranial abnormality. 2. No evidence for metastatic disease to the brain. 3. Moderate atrophy and diffuse white matter disease. This likely reflects the sequela of chronic microvascular ischemia.   Electronically Signed   By: San Morelle M.D.   On: 04/02/2015 17:18   Nm Pulmonary Perf And Vent  04/02/2015   CLINICAL DATA:  Shortness of Breath  EXAM: NUCLEAR MEDICINE VENTILATION - PERFUSION LUNG SCAN  Views: Anterior, posterior, LPO, RPO, LAO, RAO-ventilation and perfusion  Radionuclide: Technetium 99 m DTPA-ventilation; Technetium 42m macroaggregated albumin-perfusion  Dose:  40.0 mCi-ventilation; 6.0 mCi-perfusion  Route of administration: Inhalation -ventilation ; intravenous -perfusion  COMPARISON:  Chest radiograph April 02, 2015  FINDINGS: Ventilation: Radiotracer uptake is homogeneous and symmetric bilaterally. There is elevation of the left hemidiaphragm. No focal ventilation defects are identified.  Perfusion: There is elevation of the left hemidiaphragm. Radiotracer uptake is homogeneous and symmetric bilaterally. There are no appreciable perfusion defects.  IMPRESSION: No appreciable ventilation or perfusion defects. This study constitutes a very low probability of pulmonary embolus.   Electronically Signed   By: Lowella Grip III M.D.   On: 04/02/2015 16:48   Dg Chest Port 1 View  04/02/2015   CLINICAL DATA:  Shortness of breath.  EXAM: PORTABLE CHEST - 1 VIEW  COMPARISON:  CT 04/01/2015.  Chest x-ray 03/27/2015.  FINDINGS: Mediastinum hilar structures normal. Lungs are clear. Elevation of the left hemidiaphragm again noted. Mild subsegmental atelectasis left lung base. Small left pleural effusion cannot be excluded. Interposition of the colon under the left hemidiaphragm again noted. No free air . No acute bony abnormality.   IMPRESSION: 1. Stable elevation hemidiaphragm . 2. Mild left base subsegmental atelectasis. Small left pleural effusion cannot be excluded .   Electronically Signed   By: Marcello Moores  Register   On: 04/02/2015 11:32   Dg Chest Port 1 View  03/27/2015   CLINICAL DATA:  Weakness, fatigue, shortness of breath for 2 days. Ex-smoker  EXAM: PORTABLE CHEST - 1 VIEW  COMPARISON:  None.  FINDINGS: There is marked elevation of the left hemidiaphragm, of uncertain acuity. Lungs appear to be at least mildly hyperexpanded suggesting some degree of COPD. Lungs appear clear. No evidence of pneumonia. No pleural effusion. No pneumothorax.  Osseous structures are unremarkable. Cardiomediastinal silhouette appears normal in size and configuration, although a portion of the left heart border is obscured by the left hemidiaphragm elevation.  IMPRESSION: Lungs appear hyperexpanded  suggesting some degree of underlying COPD.  Lungs are clear.  No evidence of acute cardiopulmonary abnormality.  Marked elevation of the left hemidiaphragm, of uncertain acuity but probably chronic.   Electronically Signed   By: Franki Cabot M.D.   On: 03/27/2015 14:55       IMPRESSION:  The patient has a new diagnosis of gastric carcinoma involving the gastric cardia. This is associated with a GI bleed. No clear evidence of distant disease at this time. Insufficient information currently to precisely stage the patient. Further staging could potentially include endoscopic ultrasound, PET scan, and MRI of the liver. He likely is a good candidate for chemoradiation treatment, followed by possible surgery.   PLAN: I have spoke to the patient's daughter regarding his diagnosis. I will coordinate his care with Dr. Benay Spice, and I will discuss further potential staging options with him. The patient may be appropriate to proceed with radiation planning in the near future.       ________________________________   Jodelle Gross, MD,  PhD   **Disclaimer: This note was dictated with voice recognition software. Similar sounding words can inadvertently be transcribed and this note may contain transcription errors which may not have been corrected upon publication of note.**

## 2015-04-03 NOTE — Progress Notes (Signed)
I have spoken with Dr. Benay Spice. Given his overall clinical picture, further staging will not likely alter his course of care. Therefore, it is reasonable to go ahead and proceed with treatment. He is a good candidate to proceed with radiation treatment. A potential 5 1/2 week course of treatment will be offered to the family, with concurrent chemotherapy. If the family wishes a shorter course of treatment, then a 2 week course of radiation could be considered, followed by chemotherapy.   The patient can undergo simulation tomorrow, 04/04/15 at [facility].

## 2015-04-03 NOTE — Progress Notes (Addendum)
CSW (Clinical Education officer, museum) has spoken with pt daughter multiple times today to provide bed offers and notified of plan for dc today. Pt daughter expressed frustration to CSW that she was not notified by other staff of plan for dc. CSW apologized and provided support.  During last phone call at 4:15pm CSW notified pt daughter that decision on which facility she would like pt to dc to is needed at this time in order to allow facility time to prepare for pt. Pt daughter upset with CSW and stated she could not make that decision right now. She then informed CSW she would have to call CSW back and hung up the phone. CSW will try pt daughter back shortly to notify that if decision is not reach soon some facilities may not be able to accept pt today and bed offers will become more limited.  ADDENDUM: CSW spoke with charge nurse about conversation with pt daughter and delay in discharge. Charge nurse to ask MD to communicate with pt family.  [place], [place]

## 2015-04-03 NOTE — Care Management Note (Signed)
Case Management Note Benjamin Gibbons RN, BSN Unit 2W-Case Manager [phone] Covering for 3W  Patient Details  Name: Benjamin Lawson MRN: [id number] Date of Birth: Apr 03, 1933  Subjective/Objective:     Pt admitted with hypotension and GIB               Action/Plan:  PTA pt lived at home PT recommending [facility] vs SNF pending progress- NCM to follow   Expected Discharge Date:       04/03/15           Expected Discharge Plan:  Skilled Nursing Facility  In-House Referral:  Clinical Social Work  Discharge planning Services  CM Consult  Post Acute Care Choice:   NA Choice offered to:      Status of Service:  Completed, signed off  Medicare Important Message Given:  Yes-third notification given Date Medicare IM Given:    Medicare IM give by:    Date Additional Medicare IM Given:    Additional Medicare Important Message give by:     If discussed at [place] of Stay Meetings, dates discussed:  04/01/15, 04/03/15  Additional Comments:  04/03/15- pt for d/c today with plan for oncology f/u outpt- CSW following for placement needs  04/01/15- awaiting oncology consult, cont. IVF  Dawayne Patricia, RN 04/03/2015, 3:14 PM

## 2015-04-03 NOTE — Progress Notes (Signed)
CSW (Clinical Education officer, museum) sent updated oncology note with treatment plan to facilities. Awaiting response back from facilities before providing bed offers to pt daughter. CSW did speak with pt daughter and notified of reason for delay in providing bed offers. Pt daughter understanding that CSW attempting to provide most accurate picture to facilities and awaiting responses.  [place], [place]

## 2015-04-03 NOTE — Discharge Summary (Signed)
Physician Discharge Summary  Benjamin Lawson MRN: [id number] DOB/AGE: Mar 05, 1933 81 y.o.  PCP: Benjamin Lopes, MD   Admit date: 03/27/2015 Discharge date: 04/03/2015  Discharge Diagnoses:   Active Problems:   Hypotension   Acute renal failure   Lactic acidosis   Anemia   Alcohol abuse   Failure to thrive in adult   Malnutrition of moderate degree    Follow-up recommendations Follow-up with PCP in 3-5 days , including although additional recommended appointments as below Follow-up CBC, CMP in 3-5 days patient can undergo simulation tomorrow, 04/04/15 at [facility].     Medication List    STOP taking these medications        telmisartan-hydrochlorothiazide 80-12.5 MG per tablet  Commonly known as:  MICARDIS HCT      TAKE these medications        atorvastatin 20 MG tablet  Commonly known as:  LIPITOR  Take 20 mg by mouth daily.     lactose free nutrition Liqd  Take 237 mLs by mouth 2 (two) times daily with a meal.     metoprolol succinate 50 MG 24 hr tablet  Commonly known as:  TOPROL-XL  Take 50 mg by mouth daily. Take with or immediately following a meal.     pantoprazole 40 MG tablet  Commonly known as:  PROTONIX  Take 1 tablet (40 mg total) by mouth 2 (two) times daily before a meal.     sennosides-docusate sodium 8.6-50 MG tablet  Commonly known as:  SENOKOT-S  Take 1 tablet by mouth daily.     tolterodine 4 MG 24 hr capsule  Commonly known as:  DETROL LA  Take 4 mg by mouth daily.         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 Presented with hypotension, worsening anemia and frank melanotic  stools 1 post admission. Started on PPI, GI consulted-underwent EGD on 7/29-findings concerning for gastric cancer-confirmed adenocarcinoma by biopsy.  EXAM: CT CHEST, ABDOMEN AND PELVIS WITHOUT CONTRAST  TECHNIQUE: Multidetector CT imaging of the chest, abdomen and pelvis was performed following the standard protocol without IV contrast.  COMPARISON:  None.  FINDINGS: CT CHEST FINDINGS  Thoracic inlet: No mass or adenopathy. Thyroid gland is unremarkable.  Mediastinum and hila: Heart normal in size. Mild coronary artery calcifications. Great vessels are normal in caliber for age. Mild atherosclerotic calcifications along the thoracic aorta and at the origins of the aortic branch vessels. No mediastinal or hilar masses or pathologically enlarged lymph nodes.  Lungs and pleura: Significant elevation of left hemidiaphragm. Small bilateral pleural effusions. There is dependent atelectasis mostly in the lower lobes. No lung mass or suspicious nodule. No lung consolidation or edema. Tracheobronchial tree is patent. Paraseptal emphysema noted in the upper lobes at the apices where there is also pleural parenchymal scarring.  CT ABDOMEN AND PELVIS FINDINGS  Liver: Scattered small low-density lesions, mostly subcentimeter. Largest cyst seen centrally measuring 13 mm. These are nonspecific. They may reflect cysts. Metastatic lesions are possible. No other liver abnormality.  Spleen, gallbladder, pancreas, adrenal glands:  Unremarkable.  Kidneys, ureters, bladder: No renal masses. Small nonobstructing stone seen in each kidney. Mild diffuse renal cortical thinning. No hydronephrosis.  Chronic appearing bilateral perinephric stranding. Ureters normal course and in caliber. Bladder is unremarkable.  Prostate gland: Multiple radiation therapy seeds. Fat planes between the prostate, seminal vesicles and bladder are relatively well preserved.  Lymph nodes:  No pathologically enlarged lymph nodes.  Ascites:  Trace ascites evident  in the posterior pelvic recess.  Gastrointestinal: Stomach shows irregular wall thickening. This is seen diffusely but is most prominent involving the gastric antrum porta measures 15 mm in thickness. No ulceration.  There is distention of most of the colon. The splenic flexure has a maximum diameter of 8.8 cm. No colonic wall thickening or adjacent inflammation is seen. There are loops of mildly dilated small bowel with other areas of decompressed small bowel. No discrete small bowel mass or wall thickening is seen.  Mesentery: Small soft tissue nodules are seen adjacent to the stomach. Some of the apparent nodules appear to be prominent vascularity. Other discrete nodules may be due to prominent lymph nodes. Possibility of subtle peritoneal metastatic disease should be considered.  Vascular: Mild generalized ectasia of the abdominal aorta without a focal aneurysm. There is atherosclerotic calcification along the abdominal aorta and its branch vessels.  MUSCULOSKELETAL  No osteoblastic or osteolytic lesions.  MISCELLANEOUS  There is generalized soft tissue edema most evident in the lower abdomen and pelvis.  IMPRESSION: 1. Diffuse wall thickening of the stomach consistent with the given history of gastric adenocarcinoma. No definite CT evidence of extension of neoplastic disease beyond the stomach serosa. 2. Distention of the majority of the colon and multiple loops of small bowel with other areas of small bowel being decompressed. This may reflect an adynamic ileus. No discrete level of obstruction is seen. 3. Small nodular areas of soft tissue seen adjacent to the stomach. This could reflect local peritoneal metastatic disease or sub cm neoplastic adenopathy. 4. No evidence of metastatic disease in the chest. There small pleural effusions with associated dependent atelectasis. 5. Small hypo attenuating liver lesions. These are nonspecific. They may all be due to cysts. Metastatic disease is possible. 6. No other  evidence of metastatic disease.   Electronically Signed   By: Lajean Manes M.D.   On: 04/01/2015 19:37   Ct Head Wo Contrast  04/01/2015   CLINICAL DATA:  Confusion/ altered mental status for 1 day  EXAM: CT HEAD WITHOUT CONTRAST  TECHNIQUE: Contiguous axial images were obtained from the base of the skull through the vertex without intravenous contrast.  COMPARISON:  None.  FINDINGS: There is moderate diffuse atrophy. There is no intracranial mass, hemorrhage, extra-axial fluid collection, or midline shift. There is moderate small vessel disease throughout the centra semiovale bilaterally. There is no acute infarct evident. The bony calvarium appears intact. The mastoid air cells are clear.  IMPRESSION: Atrophy with periventricular small vessel disease. No acute infarct evident. No hemorrhage or mass effect.   Electronically Signed   By: Lowella Grip III M.D.   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 Presented with hypotension, worsening anemia and frank melanotic stools 1 post admission. Started on PPI, GI consulted-underwent EGD on 7/29-findings concerning for gastric cancer-confirmed adenocarcinoma by biopsy.  EXAM: CT CHEST, ABDOMEN AND PELVIS WITHOUT CONTRAST  TECHNIQUE: Multidetector CT imaging of the chest, abdomen and pelvis was performed following the standard protocol without  IV contrast.  COMPARISON:  None.  FINDINGS: CT CHEST FINDINGS  Thoracic inlet: No mass or adenopathy. Thyroid gland is unremarkable.  Mediastinum and hila: Heart normal in size. Mild coronary artery calcifications. Great vessels are normal in caliber for age. Mild atherosclerotic calcifications along the thoracic aorta and at the origins of the aortic branch vessels. No mediastinal or  hilar masses or pathologically enlarged lymph nodes.  Lungs and pleura: Significant elevation of left hemidiaphragm. Small bilateral pleural effusions. There is dependent atelectasis mostly in the lower lobes. No lung mass or suspicious nodule. No lung consolidation or edema. Tracheobronchial tree is patent. Paraseptal emphysema noted in the upper lobes at the apices where there is also pleural parenchymal scarring.  CT ABDOMEN AND PELVIS FINDINGS  Liver: Scattered small low-density lesions, mostly subcentimeter. Largest cyst seen centrally measuring 13 mm. These are nonspecific. They may reflect cysts. Metastatic lesions are possible. No other liver abnormality.  Spleen, gallbladder, pancreas, adrenal glands:  Unremarkable.  Kidneys, ureters, bladder: No renal masses. Small nonobstructing stone seen in each kidney. Mild diffuse renal cortical thinning. No hydronephrosis. Chronic appearing bilateral perinephric stranding. Ureters normal course and in caliber. Bladder is unremarkable.  Prostate gland: Multiple radiation therapy seeds. Fat planes between the prostate, seminal vesicles and bladder are relatively well preserved.  Lymph nodes:  No pathologically enlarged lymph nodes.  Ascites:  Trace ascites evident in the posterior pelvic recess.  Gastrointestinal: Stomach shows irregular wall thickening. This is seen diffusely but is most prominent involving the gastric antrum porta measures 15 mm in thickness. No ulceration.  There is distention of most of the colon. The splenic flexure has a maximum diameter of 8.8 cm. No colonic wall thickening or adjacent inflammation is seen. There are loops of mildly dilated small bowel with other areas of decompressed small bowel. No discrete small bowel mass or wall thickening is seen.  Mesentery: Small soft tissue nodules are seen adjacent to the stomach. Some of the apparent nodules appear to be prominent vascularity. Other discrete nodules may be due to prominent lymph  nodes. Possibility of subtle peritoneal metastatic disease should be considered.  Vascular: Mild generalized ectasia of the abdominal aorta without a focal aneurysm. There is atherosclerotic calcification along the abdominal aorta and its branch vessels.  MUSCULOSKELETAL  No osteoblastic or osteolytic lesions.  MISCELLANEOUS  There is generalized soft tissue edema most evident in the lower abdomen and pelvis.  IMPRESSION: 1. Diffuse wall thickening of the stomach consistent with the given history of gastric adenocarcinoma. No definite CT evidence of extension of neoplastic disease beyond the stomach serosa. 2. Distention of the majority of the colon and multiple loops of small bowel with other areas of small bowel being decompressed. This may reflect an adynamic ileus. No discrete level of obstruction is seen. 3. Small nodular areas of soft tissue seen adjacent to the stomach. This could reflect local peritoneal metastatic disease or sub cm neoplastic adenopathy. 4. No evidence of metastatic disease in the chest. There small pleural effusions with associated dependent atelectasis. 5. Small hypo attenuating liver lesions. These are nonspecific. They may all be due to cysts. Metastatic disease is possible. 6. No other evidence of metastatic disease.   Electronically Signed   By: Lajean Manes M.D.   On: 04/01/2015 19:37   Mr Brain Wo Contrast  04/02/2015   CLINICAL DATA:  Confusion.  Weakness.  Gastric adenocarcinoma.  EXAM: MRI HEAD WITHOUT CONTRAST  TECHNIQUE: Multiplanar, multiecho pulse sequences of the brain and surrounding structures  were obtained without intravenous contrast.  COMPARISON:  CT without contrast 04/01/2015.  FINDINGS: Moderate generalized atrophy and diffuse white matter changes are noted bilaterally. No acute infarct, hemorrhage, or mass is present. The ventricles are proportionate to the degree of atrophy.  Flow is present in the major intracranial arteries. The globes and orbits are intact.  The paranasal sinuses are clear. Mastoid air cells are clear.  Skullbase is within normal limits. Midline structures are unremarkable.  IMPRESSION: 1. No acute intracranial abnormality. 2. No evidence for metastatic disease to the brain. 3. Moderate atrophy and diffuse white matter disease. This likely reflects the sequela of chronic microvascular ischemia.   Electronically Signed   By: San Morelle M.D.   On: 04/02/2015 17:18   Nm Pulmonary Perf And Vent  04/02/2015   CLINICAL DATA:  Shortness of Breath  EXAM: NUCLEAR MEDICINE VENTILATION - PERFUSION LUNG SCAN  Views: Anterior, posterior, LPO, RPO, LAO, RAO-ventilation and perfusion  Radionuclide: Technetium 99 m DTPA-ventilation; Technetium 56mmacroaggregated albumin-perfusion  Dose:  40.0 mCi-ventilation; 6.0 mCi-perfusion  Route of administration: Inhalation -ventilation ; intravenous -perfusion  COMPARISON:  Chest radiograph April 02, 2015  FINDINGS: Ventilation: Radiotracer uptake is homogeneous and symmetric bilaterally. There is elevation of the left hemidiaphragm. No focal ventilation defects are identified.  Perfusion: There is elevation of the left hemidiaphragm. Radiotracer uptake is homogeneous and symmetric bilaterally. There are no appreciable perfusion defects.  IMPRESSION: No appreciable ventilation or perfusion defects. This study constitutes a very low probability of pulmonary embolus.   Electronically Signed   By: WLowella GripIII M.D.   On: 04/02/2015 16:48   Dg Chest Port 1 View  04/02/2015   CLINICAL DATA:  Shortness of breath.  EXAM: PORTABLE CHEST - 1 VIEW  COMPARISON:  CT 04/01/2015.  Chest x-ray 03/27/2015.  FINDINGS: Mediastinum hilar structures normal. Lungs are clear. Elevation of the left hemidiaphragm again noted. Mild subsegmental atelectasis left lung base. Small left pleural effusion cannot be excluded. Interposition of the colon under the left hemidiaphragm again noted. No free air . No acute bony abnormality.   IMPRESSION: 1. Stable elevation hemidiaphragm . 2. Mild left base subsegmental atelectasis. Small left pleural effusion cannot be excluded .   Electronically Signed   By: TMarcello Moores Register   On: 04/02/2015 11:32   Dg Chest Port 1 View  03/27/2015   CLINICAL DATA:  Weakness, fatigue, shortness of breath for 2 days. Ex-smoker  EXAM: PORTABLE CHEST - 1 VIEW  COMPARISON:  None.  FINDINGS: There is marked elevation of the left hemidiaphragm, of uncertain acuity. Lungs appear to be at least mildly hyperexpanded suggesting some degree of COPD. Lungs appear clear. No evidence of pneumonia. No pleural effusion. No pneumothorax.  Osseous structures are unremarkable. Cardiomediastinal silhouette appears normal in size and configuration, although a portion of the left heart border is obscured by the left hemidiaphragm elevation.  IMPRESSION: Lungs appear hyperexpanded suggesting some degree of underlying COPD.  Lungs are clear.  No evidence of acute cardiopulmonary abnormality.  Marked elevation of the left hemidiaphragm, of uncertain acuity but probably chronic.   Electronically Signed   By: SFranki CabotM.D.   On: 03/27/2015 14:55      Filed Weights   04/01/15 0601 04/02/15 0500 04/03/15 0500  Weight: 88.587 kg (195 lb 4.8 oz) 86.818 kg (191 lb 6.4 oz) 85.458 kg (188 lb 6.4 oz)     Microbiology: Recent Results (from the past 240 hour(s))  Culture, blood (routine x 2)  Status: None   Collection Time: 03/27/15  4:56 PM  Result Value Ref Range Status   Specimen Description BLOOD LEFT ARM  Final   Special Requests BOTTLES DRAWN AEROBIC AND ANAEROBIC 5CC  Final   Culture  Setup Time   Final    GRAM POSITIVE COCCI IN CLUSTERS AEROBIC BOTTLE ONLY CRITICAL RESULT CALLED TO, READ BACK BY AND VERIFIED WITH: J KOME 03/28/15 @ 24 M VESTAL    Culture   Final    STAPHYLOCOCCUS SPECIES (COAGULASE NEGATIVE) THE SIGNIFICANCE OF ISOLATING THIS ORGANISM FROM A SINGLE SET OF BLOOD CULTURES WHEN MULTIPLE SETS ARE  DRAWN IS UNCERTAIN. PLEASE NOTIFY THE MICROBIOLOGY DEPARTMENT WITHIN ONE WEEK IF SPECIATION AND SENSITIVITIES ARE REQUIRED.    Report Status 03/30/2015 FINAL  Final  Culture, blood (routine x 2)     Status: None   Collection Time: 03/28/15  8:06 PM  Result Value Ref Range Status   Specimen Description BLOOD RIGHT HAND  Final   Special Requests BOTTLES DRAWN AEROBIC ONLY 5CC  Final   Culture NO GROWTH 5 DAYS  Final   Report Status 04/02/2015 FINAL  Final  Culture, blood (routine x 2)     Status: None   Collection Time: 03/28/15  8:37 PM  Result Value Ref Range Status   Specimen Description BLOOD RIGHT HAND  Final   Special Requests BOTTLES DRAWN AEROBIC ONLY 5CC  Final   Culture NO GROWTH 5 DAYS  Final   Report Status 04/02/2015 FINAL  Final       Blood Culture    Component Value Date/Time   SDES BLOOD RIGHT HAND 03/28/2015 2037   SPECREQUEST BOTTLES DRAWN AEROBIC ONLY 5CC 03/28/2015 2037   CULT NO GROWTH 5 DAYS 03/28/2015 2037   REPTSTATUS 04/02/2015 FINAL 03/28/2015 2037      Labs: Results for orders placed or performed during the hospital encounter of 03/27/15 (from the past 48 hour(s))  CEA     Status: Abnormal   Collection Time: 04/01/15  3:28 PM  Result Value Ref Range   CEA 12.2 (H) 0.0 - 4.7 ng/mL    Comment: (NOTE)       Roche ECLIA methodology       Nonsmokers  <3.9                                     Smokers     <5.6 Performed At: [facility] [place], [place] [id number] Lindon Romp MD [id number]   CBC     Status: Abnormal   Collection Time: 04/02/15  3:36 AM  Result Value Ref Range   WBC 7.6 4.0 - 10.5 K/uL   RBC 3.28 (L) 4.22 - 5.81 MIL/uL   Hemoglobin 9.6 (L) 13.0 - 17.0 g/dL   HCT 29.1 (L) 39.0 - 52.0 %   MCV 88.7 78.0 - 100.0 fL   MCH 29.3 26.0 - 34.0 pg   MCHC 33.0 30.0 - 36.0 g/dL   RDW 15.4 11.5 - 15.5 %   Platelets 279 150 - 400 K/uL  Basic metabolic panel     Status: Abnormal   Collection Time: 04/02/15  3:36  AM  Result Value Ref Range   Sodium 139 135 - 145 mmol/L   Potassium 4.4 3.5 - 5.1 mmol/L   Chloride 113 (H) 101 - 111 mmol/L   CO2 21 (L) 22 - 32 mmol/L   Glucose, Bld 95  65 - 99 mg/dL   BUN 20 6 - 20 mg/dL   Creatinine, Ser 1.28 (H) 0.61 - 1.24 mg/dL   Calcium 7.9 (L) 8.9 - 10.3 mg/dL   GFR calc non Af Amer 51 (L) >60 mL/min   GFR calc Af Amer 59 (L) >60 mL/min    Comment: (NOTE) The eGFR has been calculated using the CKD EPI equation. This calculation has not been validated in all clinical situations. eGFR's persistently <60 mL/min signify possible Chronic Kidney Disease.    Anion gap 5 5 - 15  CBC     Status: Abnormal   Collection Time: 04/03/15  3:11 AM  Result Value Ref Range   WBC 7.8 4.0 - 10.5 K/uL   RBC 3.19 (L) 4.22 - 5.81 MIL/uL   Hemoglobin 9.2 (L) 13.0 - 17.0 g/dL   HCT 28.3 (L) 39.0 - 52.0 %   MCV 88.7 78.0 - 100.0 fL   MCH 28.8 26.0 - 34.0 pg   MCHC 32.5 30.0 - 36.0 g/dL   RDW 15.2 11.5 - 15.5 %   Platelets 286 150 - 400 K/uL  Comprehensive metabolic panel     Status: Abnormal   Collection Time: 04/03/15  3:11 AM  Result Value Ref Range   Sodium 138 135 - 145 mmol/L   Potassium 4.8 3.5 - 5.1 mmol/L   Chloride 111 101 - 111 mmol/L   CO2 22 22 - 32 mmol/L   Glucose, Bld 106 (H) 65 - 99 mg/dL   BUN 16 6 - 20 mg/dL   Creatinine, Ser 1.34 (H) 0.61 - 1.24 mg/dL   Calcium 8.1 (L) 8.9 - 10.3 mg/dL   Total Protein 4.0 (L) 6.5 - 8.1 g/dL   Albumin 1.9 (L) 3.5 - 5.0 g/dL   AST 30 15 - 41 U/L   ALT 27 17 - 63 U/L   Alkaline Phosphatase 41 38 - 126 U/L   Total Bilirubin 0.3 0.3 - 1.2 mg/dL   GFR calc non Af Amer 48 (L) >60 mL/min   GFR calc Af Amer 56 (L) >60 mL/min    Comment: (NOTE) The eGFR has been calculated using the CKD EPI equation. This calculation has not been validated in all clinical situations. eGFR's persistently <60 mL/min signify possible Chronic Kidney Disease.    Anion gap 5 5 - 15     Lipid Panel  No results found for: CHOL, TRIG,  HDL, CHOLHDL, VLDL, LDLCALC, LDLDIRECT   No results found for: HGBA1C   Lab Results  Component Value Date   CREATININE 1.34* 04/03/2015     HPI :81 y.o. male admitted on 03/27/15 with hypotension after taking antihypertensives, in the setting of 2-3 week history of poor oral intake with subsequent weight loss. He did not report nausea, heartburn or change in bowel habits, but did notice melanotic stools. No respiratory complaints. No chest pain. No dysuria or hematuria. No skin rashes, or neuropathy. He did notice dizziness as well without syncopal episodes. At the time of evaluation patient is confused, history is obtained from chart. Hb on admission was 7.0 requiring transfusion. GI consultation as upper GI bleed was suspected. EGD on 7/29 showed near circumferential mass in the cardia. 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 Benjamin Lawson is the probable cause of his UGI bleed causing anemia and melena. Diagnosis ([id number]) was consistent with Adenocarcinoma. . Staging CTs pending. No family history of GI cancer. Last colonoscopy prior to this admission was done remotely  HOSPITAL COURSE:  Hypotension: Resolved.Likely  secondary to hypovolemia from GI bleed and continued use of anti-hypertensive medications. Blood pressure much better following IV fluid resuscitation and transfusion of 2 units of PRBC. . Follow closely.  Acute blood loss anemia: Secondary to Suspected upper GI bleed. Transfused 2 units of PRBC-following which Hb   .Follow hemoglobin closely. Hemoglobin 9.2 prior to discharge. Continue twice a day PPI  Upper GI bleed: Presented with hypotension, worsening anemia and frank melanotic stools 1 post admission. Started on PPI twice a day, GI consulted-underwent EGD on 7/29-findings concerning for gastric cancer-confirmed adenocarcinoma by biopsy.Oncology consulted, CT chest and abdomen without contrast shows fairly localised disease with peritoneal metastatic  disease . MRI of the brain was negative for metastasis. Oncology and radiation oncology recommend concomitant radiation and chemotherapy. Patient is scheduled for simulation on 8/5    Acute on chronic kidney disease stage III: Likely prerenal azotemia in a setting of acute blood loss and continued antihypertensives use (Micardis HCTZ). 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 No evidence of recurrence of melena-hemoglobin stable. Much better with low-dose metoprolol. Patient continues to be tachycardic. VQ scan negative for PE  Coag neg staph bacteremia: Likely contamination-repeat blood culture negative. Discontinued vancomycin-and monitor off Abx  History of hypertension: start low dose metoprolol-BP more improved.   Known severe malnutrition: continue supplements  ? EtOH use: denies significant EtOH use to me-monitor for withdrawals.  Discharge Exam:    Blood pressure 101/56, pulse 87, temperature 98.3 F (36.8 C), temperature source Oral, resp. rate 18, height 5' 11"  (1.803 m), weight 85.458 kg (188 lb 6.4 oz), SpO2 94 %.  Gen Exam: Awake and alert with clear speech. He looks chronically ill looking and pale. Neck: Supple, No JVD.  Chest: B/L Clear. No rales or rhonchi CVS: S1 S2 Regular, no murmurs.  Abdomen: soft, BS +, non tender, non distended.  Extremities: no edema, lower extremities warm to touch. Neurologic: Non Focal.  Skin: No Rash.        Discharge Instructions    Diet - low sodium heart healthy    Complete by:  As directed      Increase activity slowly    Complete by:  As directed            Follow-up Information    Follow up with Benjamin Lopes, MD. Schedule an appointment as soon as possible for a visit in 3 days.   Specialty:  Internal Medicine   Contact information:   [address] [place] [address] [phone]       Follow up with Kyung Rudd, MD. Schedule an appointment as soon as possible for a visit on 04/04/2015.   Specialty:  Radiation Oncology   Why:  simulation tomorrow, 04/04/15 at [facility] information:   [address] [place] [id number] [phone]       Follow up with Betsy Coder, MD. Schedule an appointment as soon as possible for a visit in 3 days.   Specialty:  Oncology   Contact information:   [place] [address] [phone]       Signed: Reyne Dumas 04/03/2015, 1:09 PM        Time spent >45 mins

## 2015-04-03 NOTE — Progress Notes (Signed)
I spoke on the phone with Dr. Benay Spice and he stated that the pt will start radiation in a week and will continue for five weeks, Monday through Friday.  He will also be receiving chemotherapy once a week. Pt may be discharged to skilled.

## 2015-04-03 NOTE — Progress Notes (Signed)
IP PROGRESS NOTE  Subjective:   He is alert and oriented this morning. His daughter is at the bedside.  Objective: Vital signs in last 24 hours: Blood pressure 91/62, pulse 88, temperature 98 F (36.7 C), temperature source Oral, resp. rate 20, height 5\' 11"  (1.803 m), weight 188 lb 6.4 oz (85.458 kg), SpO2 99 %.  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 Presented with hypotension, worsening anemia and frank melanotic stools 1 post admission. Started on PPI, GI consulted-underwent EGD on 7/29-findings concerning for gastric cancer-confirmed adenocarcinoma by biopsy.  EXAM: CT CHEST, ABDOMEN AND PELVIS WITHOUT CONTRAST  TECHNIQUE: Multidetector CT imaging of the chest, abdomen and pelvis was performed following the standard protocol without IV contrast.  COMPARISON:  None.  FINDINGS: CT CHEST FINDINGS  Thoracic inlet: No mass or adenopathy. Thyroid gland is unremarkable.  Mediastinum and hila: Heart normal in size. Mild coronary artery calcifications. Great vessels are normal in caliber for age. Mild atherosclerotic calcifications along the thoracic aorta and at the origins of the aortic branch vessels. No mediastinal or hilar masses or pathologically enlarged lymph nodes.  Lungs and pleura: Significant elevation of  left hemidiaphragm. Small bilateral pleural effusions. There is dependent atelectasis mostly in the lower lobes. No lung mass or suspicious nodule. No lung consolidation or edema. Tracheobronchial tree is patent. Paraseptal emphysema noted in the upper lobes at the apices where there is also pleural parenchymal scarring.  CT ABDOMEN AND PELVIS FINDINGS  Liver: Scattered small low-density lesions, mostly subcentimeter. Largest cyst seen centrally measuring 13 mm. These are nonspecific. They may reflect cysts. Metastatic lesions are possible. No other liver abnormality.  Spleen, gallbladder, pancreas, adrenal glands:  Unremarkable.  Kidneys, ureters, bladder: No renal masses. Small nonobstructing stone seen in each kidney. Mild diffuse renal cortical thinning. No hydronephrosis. Chronic appearing bilateral perinephric stranding. Ureters normal course and in caliber. Bladder is unremarkable.  Prostate gland: Multiple radiation therapy seeds. Fat planes between the prostate, seminal vesicles and bladder are relatively well preserved.  Lymph nodes:  No pathologically enlarged lymph nodes.  Ascites:  Trace ascites evident in the posterior pelvic recess.  Gastrointestinal: Stomach shows irregular wall thickening. This is seen diffusely but is most prominent involving the gastric antrum porta measures 15 mm in thickness. No ulceration.  There is distention of most of the colon. The splenic flexure has a maximum diameter of 8.8 cm. No colonic wall thickening or adjacent inflammation is seen. There are loops of mildly dilated small bowel with other areas of decompressed small bowel. No discrete small bowel mass or wall thickening is seen.  Mesentery: Small soft tissue nodules are seen adjacent to the stomach. Some of the apparent nodules appear to be prominent vascularity. Other discrete nodules may be due to  prominent lymph nodes. Possibility of subtle peritoneal metastatic disease should be considered.  Vascular: Mild  generalized ectasia of the abdominal aorta without a focal aneurysm. There is atherosclerotic calcification along the abdominal aorta and its branch vessels.  MUSCULOSKELETAL  No osteoblastic or osteolytic lesions.  MISCELLANEOUS  There is generalized soft tissue edema most evident in the lower abdomen and pelvis.  IMPRESSION: 1. Diffuse wall thickening of the stomach consistent with the given history of gastric adenocarcinoma. No definite CT evidence of extension of neoplastic disease beyond the stomach serosa. 2. Distention of the majority of the colon and multiple loops of small bowel with other areas of small bowel being decompressed. This may reflect an adynamic ileus. No discrete level of obstruction is seen. 3. Small nodular areas of soft tissue seen adjacent to the stomach. This could reflect local peritoneal metastatic disease or sub cm neoplastic adenopathy. 4. No evidence of metastatic disease in the chest. There small pleural effusions with associated dependent atelectasis. 5. Small hypo attenuating liver lesions. These are nonspecific. They may all be due to cysts. Metastatic disease is possible. 6. No other evidence of metastatic disease.   Electronically Signed   By: Lajean Manes M.D.   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 Presented with hypotension, worsening anemia and frank melanotic stools 1 post admission. Started on PPI, GI consulted-underwent EGD on 7/29-findings concerning for gastric cancer-confirmed adenocarcinoma by biopsy.  EXAM: CT CHEST, ABDOMEN AND PELVIS WITHOUT CONTRAST  TECHNIQUE: Multidetector CT imaging of the chest, abdomen and pelvis was performed following the standard protocol without IV contrast.  COMPARISON:  None.  FINDINGS: CT  CHEST FINDINGS  Thoracic inlet: No mass or adenopathy. Thyroid gland is unremarkable.  Mediastinum and hila: Heart normal in size. Mild coronary artery calcifications. Great vessels are normal in caliber for age. Mild atherosclerotic calcifications along the thoracic aorta and at the origins of the aortic branch vessels. No mediastinal or hilar masses or pathologically enlarged lymph nodes.  Lungs and pleura: Significant elevation of left hemidiaphragm. Small bilateral pleural effusions. There is dependent atelectasis mostly in the lower lobes. No lung mass or suspicious nodule. No lung consolidation or edema. Tracheobronchial tree is patent. Paraseptal emphysema noted in the upper lobes at the apices where there is also pleural parenchymal scarring.  CT ABDOMEN AND PELVIS FINDINGS  Liver: Scattered small low-density lesions, mostly subcentimeter. Largest cyst seen centrally measuring 13 mm. These are nonspecific. They may reflect cysts. Metastatic lesions are possible. No other liver abnormality.  Spleen, gallbladder, pancreas, adrenal glands:  Unremarkable.  Kidneys, ureters, bladder: No renal masses. Small nonobstructing stone seen in each kidney. Mild diffuse renal cortical thinning. No hydronephrosis. Chronic appearing bilateral perinephric stranding. Ureters normal course and in caliber. Bladder is unremarkable.  Prostate gland: Multiple radiation therapy seeds. Fat planes between the prostate, seminal vesicles and bladder are relatively well preserved.  Lymph nodes:  No pathologically enlarged lymph nodes.  Ascites:  Trace ascites evident in the posterior pelvic recess.  Gastrointestinal: Stomach shows irregular wall thickening. This is seen diffusely but is most prominent involving the gastric antrum porta measures 15 mm in thickness. No ulceration.  There is distention of most of the colon. The splenic flexure has a maximum diameter of 8.8 cm. No colonic wall thickening or adjacent  inflammation is seen.  There are loops of mildly dilated small bowel with other areas of decompressed small bowel. No discrete small bowel mass or wall thickening is seen.  Mesentery: Small soft tissue nodules are seen adjacent to the stomach. Some of the apparent nodules appear to be prominent vascularity. Other discrete nodules may be due to prominent lymph nodes. Possibility of subtle peritoneal metastatic disease should be considered.  Vascular: Mild generalized ectasia of the abdominal aorta without a focal aneurysm. There is atherosclerotic calcification along the abdominal aorta and its branch vessels.  MUSCULOSKELETAL  No osteoblastic or osteolytic lesions.  MISCELLANEOUS  There is generalized soft tissue edema most evident in the lower abdomen and pelvis.  IMPRESSION: 1. Diffuse wall thickening of the stomach consistent with the given history of gastric adenocarcinoma. No definite CT evidence of extension of neoplastic disease beyond the stomach serosa. 2. Distention of the majority of the colon and multiple loops of small bowel with other areas of small bowel being decompressed. This may reflect an adynamic ileus. No discrete level of obstruction is seen. 3. Small nodular areas of soft tissue seen adjacent to the stomach. This could reflect local peritoneal metastatic disease or sub cm neoplastic adenopathy. 4. No evidence of metastatic disease in the chest. There small pleural effusions with associated dependent atelectasis. 5. Small hypo attenuating liver lesions. These are nonspecific. They may all be due to cysts. Metastatic disease is possible. 6. No other evidence of metastatic disease.   Electronically Signed   By: Lajean Manes M.D.   On: 04/01/2015 19:37   Mr Brain Wo Contrast  04/02/2015   CLINICAL DATA:  Confusion.  Weakness.  Gastric adenocarcinoma.  EXAM: MRI HEAD WITHOUT CONTRAST  TECHNIQUE: Multiplanar, multiecho pulse sequences of the brain and surrounding structures were obtained without intravenous contrast.   COMPARISON:  CT without contrast 04/01/2015.  FINDINGS: Moderate generalized atrophy and diffuse white matter changes are noted bilaterally. No acute infarct, hemorrhage, or mass is present. The ventricles are proportionate to the degree of atrophy.  Flow is present in the major intracranial arteries. The globes and orbits are intact. The paranasal sinuses are clear. Mastoid air cells are clear.  Skullbase is within normal limits. Midline structures are unremarkable.  IMPRESSION: 1. No acute intracranial abnormality. 2. No evidence for metastatic disease to the brain. 3. Moderate atrophy and diffuse white matter disease. This likely reflects the sequela of chronic microvascular ischemia.   Electronically Signed   By: San Morelle M.D.   On: 04/02/2015 17:18   Nm Pulmonary Perf And Vent  04/02/2015   CLINICAL DATA:  Shortness of Breath  EXAM: NUCLEAR MEDICINE VENTILATION - PERFUSION LUNG SCAN  Views: Anterior, posterior, LPO, RPO, LAO, RAO-ventilation and perfusion  Radionuclide: Technetium 99 m DTPA-ventilation; Technetium 35m macroaggregated albumin-perfusion  Dose:  40.0 mCi-ventilation; 6.0 mCi-perfusion  Route of administration: Inhalation -ventilation ; intravenous -perfusion  COMPARISON:  Chest radiograph April 02, 2015  FINDINGS: Ventilation: Radiotracer uptake is homogeneous and symmetric bilaterally. There is elevation of the left hemidiaphragm. No focal ventilation defects are identified.  Perfusion: There is elevation of the left hemidiaphragm. Radiotracer uptake is homogeneous and symmetric bilaterally. There are no appreciable perfusion defects.  IMPRESSION: No appreciable ventilation or perfusion defects. This study constitutes a very low probability of pulmonary embolus.   Electronically Signed   By: Lowella Grip III M.D.   On: 04/02/2015 16:48   Dg Chest Port 1 View  04/02/2015   CLINICAL DATA:  Shortness of breath.  EXAM: PORTABLE CHEST - 1 VIEW  COMPARISON:  CT 04/01/2015.  Chest  x-ray 03/27/2015.  FINDINGS: Mediastinum hilar structures normal. Lungs are clear. Elevation of the left hemidiaphragm again noted. Mild subsegmental atelectasis left lung base. Small left pleural effusion cannot be excluded. Interposition of the colon under the left hemidiaphragm again noted. No free air . No acute bony abnormality.  IMPRESSION: 1. Stable elevation hemidiaphragm . 2. Mild left base subsegmental atelectasis. Small left pleural effusion cannot be excluded .   Electronically Signed   By: Marcello Moores  Register   On: 04/02/2015 11:32    Medications: I have reviewed the patient's current medications.  Assessment/Plan: 1. Gastric cancer 2. GI bleeding secondary to #1 3. Anemia secondary to GI bleeding-stable 4. Renal insufficiency  Mr. Sites has been diagnosed with gastric cancer. The staging CT scans reveal no clear evidence of distant metastatic disease. He does not appear to be a candidate for curative surgery. I recommend palliative chemotherapy and radiation. I discussed the case with Dr. Lisbeth Renshaw. He will plan a 2-5 week course of palliative radiation based on Mr. Denner ability to return for daily radiation. We will administer concurrent weekly chemotherapy if Dr. Lisbeth Renshaw plans a 5 week course. If a shorter palliative course of radiation is planned we will deliver chemotherapy at a later date.  I discussed the plan with his daughter. We will schedule outpatient follow-up.   LOS: 7 days   Loy Little  04/03/2015, 5:02 PM

## 2015-04-04 ENCOUNTER — Encounter: Payer: Self-pay | Admitting: *Deleted

## 2015-04-04 ENCOUNTER — Telehealth: Payer: Self-pay | Admitting: *Deleted

## 2015-04-04 ENCOUNTER — Telehealth: Payer: Self-pay | Admitting: Oncology

## 2015-04-04 ENCOUNTER — Other Ambulatory Visit: Payer: Self-pay | Admitting: *Deleted

## 2015-04-04 ENCOUNTER — Ambulatory Visit
Admit: 2015-04-04 | Discharge: 2015-04-04 | Disposition: A | Discharge status: Home / Self Care | Payer: Medicare Other | Source: Ambulatory Visit | Attending: Radiation Oncology | Admitting: Radiation Oncology

## 2015-04-04 DIAGNOSIS — C169 Malignant neoplasm of stomach, unspecified: Secondary | ICD-10-CM | POA: Insufficient documentation

## 2015-04-04 DIAGNOSIS — Z510 Encounter for antineoplastic radiation therapy: Secondary | ICD-10-CM | POA: Diagnosis not present

## 2015-04-04 DIAGNOSIS — Z79899 Other long term (current) drug therapy: Secondary | ICD-10-CM | POA: Insufficient documentation

## 2015-04-04 NOTE — Progress Notes (Signed)
Nutrition Follow-up  DOCUMENTATION CODES:  Non-severe (moderate) malnutrition in context of chronic illness  INTERVENTION:  Continue Ensure Enlive po BID, each supplement provides 350 kcal and 20 grams of protein  Add Magic cup TID with meals, each supplement provides 290 kcal and 9 grams of protein  NUTRITION DIAGNOSIS:  Malnutrition related to chronic illness as evidenced by moderate depletions of muscle mass, moderate depletion of body fat.  Ongoing  GOAL:  Patient will meet greater than or equal to 90% of their needs  MONITOR:  Diet advancement, Weight trends, Labs, I & O's  REASON FOR ASSESSMENT:  Malnutrition Screening Tool    ASSESSMENT:  81 y.o. male who  was initially seen for hypotension, poor po intake and associated weight loss. evaluation revealed anemia with a Hb of 7.0 with subsequent transfusion. GI consultation was initiated with suspected GI bleed. Pt found to have gastric adenocarcinoma.  Unfortunately, on RD arrival, pt had gone to radiation at [facility]. Unable to speak with him.   Per chart review pt has had very poor PO intake (<50%) -etiology unclear. He does have some documented supplement intake of 100%. Will continue Ensure + Add MC BID.   He is severely constipated (no bm in 5 days) which is documented as disease related.   Diet Order:  DIET SOFT Room service appropriate?: Yes; Fluid consistency:: Thin; Fluid restriction:: Other (see comments) Diet - low sodium heart healthy  Skin:  Reviewed, no issues  Last BM:  7/31- No BM recently-constipated  Height:  Ht Readings from Last 1 Encounters:  03/27/15 5\' 11"  (1.803 m)   Weight:  Wt Readings from Last 1 Encounters:  04/04/15 191 lb 12.8 oz (87 kg)   Ideal Body Weight:  78 kg   BMI:  Body mass index is 26.76 kg/(m^2).  Estimated Nutritional Needs:  Kcal:  1750-2000 (20-23 kcal/kg) Protein:  86-100 ( 1.1-1.3 g/kg IBW) Fluid:  1.8-2 liters  EDUCATION NEEDS:  No education needs identified at  this time  Burtis Junes RD, LDN Nutrition Pager: [phone] 04/04/2015 11:53 AM

## 2015-04-04 NOTE — Progress Notes (Signed)
Patient arrived via [facility] transportation,  report from [place], no c/o pain or nausea, stated patient, lips dry, , patient has a condom cath , IV site right wrist, intact, dressing op site dry, bruise near elbow, here for Ct simulation at 1100am, per Elmyra Ricks, she stated patient is supposed to go to skilled nursing facility from here,but family hasn't decided which facility as yet", TV remote handed to patient , bed locked, call bell with in reach next to patient's right hand, will let MD and CT Alona Bene RT therapist know, patient will use call bell for help 10:31 AM

## 2015-04-04 NOTE — Progress Notes (Addendum)
CSW (Clinical Education officer, museum) received call from pt daughter to notify that she would like for pt to dc to [facility]. CSW notified facility of plan for dc today.   CSW (Clinical Education officer, museum) prepared pt dc packet and placed with shadow chart. CSW arranged non-emergent ambulance transport for 2pm. Pt, pt family, pt nurse, and facility informed. CSW signing off.    Fate, [place]

## 2015-04-04 NOTE — Progress Notes (Signed)
RN gave report to Nira Conn at [facility]. Etta Quill, RN

## 2015-04-04 NOTE — Telephone Encounter (Signed)
lvm for pt regarding to 8.11 appt

## 2015-04-04 NOTE — Clinical Social Work Placement (Signed)
   CLINICAL SOCIAL WORK PLACEMENT  NOTE  Date:  04/04/2015  Patient Details  Name: Benjamin Lawson MRN: [id number] Date of Birth: Jul 04, 1933  Clinical Social Work is seeking post-discharge placement for this patient at the [place] level of care (*CSW will initial, date and re-position this form in  chart as items are completed):  Yes   Patient/family provided with [place] Work Department's list of facilities offering this level of care within the geographic area requested by the patient (or if unable, by the patient's family).  Yes   Patient/family informed of their freedom to choose among providers that offer the needed level of care, that participate in Medicare, Medicaid or managed care program needed by the patient, have an available bed and are willing to accept the patient.  Yes   Patient/family informed of [facility]'s ownership interest in [facility] and [facility], as well as of the fact that they are under no obligation to receive care at these facilities.  PASRR submitted to EDS on 04/01/15     PASRR number received on 04/01/15     Existing PASRR number confirmed on       FL2 transmitted to all facilities in geographic area requested by pt/family on 04/01/15     FL2 transmitted to all facilities within larger geographic area on       Patient informed that his/her managed care company has contracts with or will negotiate with certain facilities, including the following:        Yes   Patient/family informed of bed offers received.  Patient chooses bed at [facility]     Physician recommends and patient chooses bed at      Patient to be transferred to [facility] on 04/04/15.  Patient to be transferred to facility by [facility]     Patient family notified on 04/04/15 of transfer.  Name of family member notified:  Carlyon Shadow (daughter)     PHYSICIAN       Additional CommentBerton Mount,  [place]

## 2015-04-04 NOTE — Discharge Summary (Addendum)
**Note Benjamin via Obfuscation** Physician Discharge Summary  DEVERICK PRUSS MRN: [id number] DOB/AGE: 79/30/1934 79 y.o.  PCP: Donnajean Lopes, MD   Admit date: 03/27/2015 Discharge date: 04/04/2015  Discharge Diagnoses:   Active Problems:   Hypotension   Acute renal failure   Lactic acidosis   Anemia   Alcohol abuse   Failure to thrive in adult   Malnutrition of moderate degree    Follow-up recommendations Follow-up with PCP in 3-5 days , including although additional recommended appointments as below Follow-up CBC, CMP in 3-5 days patient can undergo simulation tomorrow, 04/04/15 at [facility].  patient's discharge was postponed by 1 day as family had not picked an  SNF bed and  Patient's  simulation was not scheduled  Until this morning.    Medication List    STOP taking these medications        telmisartan-hydrochlorothiazide 80-12.5 MG per tablet  Commonly known as:  MICARDIS HCT      TAKE these medications        atorvastatin 20 MG tablet  Commonly known as:  LIPITOR  Take 20 mg by mouth daily.     lactose free nutrition Liqd  Take 237 mLs by mouth 2 (two) times daily with a meal.     metoprolol succinate 50 MG 24 hr tablet  Commonly known as:  TOPROL-XL  Take 50 mg by mouth daily. Take with or immediately following a meal.     pantoprazole 40 MG tablet  Commonly known as:  PROTONIX  Take 1 tablet (40 mg total) by mouth 2 (two) times daily before a meal.     sennosides-docusate sodium 8.6-50 MG tablet  Commonly known as:  SENOKOT-S  Take 1 tablet by mouth daily.     tolterodine 4 MG 24 hr capsule  Commonly known as:  DETROL LA  Take 4 mg by mouth daily.         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 Presented with hypotension, worsening anemia and frank melanotic stools 1 post admission. Started on PPI, GI consulted-underwent EGD on 7/29-findings concerning for gastric cancer-confirmed adenocarcinoma by biopsy.  EXAM: CT CHEST, ABDOMEN AND PELVIS WITHOUT CONTRAST  TECHNIQUE: Multidetector CT imaging of the chest, abdomen and pelvis was performed following the standard protocol without IV contrast.  COMPARISON:  None.  FINDINGS: CT CHEST FINDINGS  Thoracic inlet: No mass or adenopathy. Thyroid gland is unremarkable.  Mediastinum and hila: Heart normal in size. Mild coronary artery calcifications. Great vessels are normal in caliber for age. Mild atherosclerotic calcifications along the thoracic aorta and at the origins of the aortic branch vessels. No mediastinal or hilar masses or pathologically enlarged lymph nodes.  Lungs and pleura: Significant elevation of left hemidiaphragm. Small bilateral pleural effusions. There is dependent atelectasis mostly in the lower lobes. No lung mass or suspicious nodule. No lung consolidation or edema. Tracheobronchial tree is patent. Paraseptal emphysema noted in the upper lobes at the apices where there is also pleural parenchymal scarring.  CT ABDOMEN AND PELVIS FINDINGS  Liver: Scattered small low-density lesions, mostly subcentimeter. Largest cyst seen centrally measuring 13 mm. These are nonspecific. They may reflect cysts. Metastatic lesions are possible. No other liver abnormality.  Spleen, gallbladder, pancreas, adrenal glands:  Unremarkable.  Kidneys, ureters, bladder: No renal masses. Small nonobstructing stone seen in each kidney. Mild diffuse renal cortical thinning. No hydronephrosis. Chronic appearing bilateral perinephric stranding. Ureters normal course and in caliber. Bladder is unremarkable.  Prostate gland: Multiple radiation therapy seeds. Fat planes between the prostate,  seminal vesicles and bladder are relatively well preserved.  Lymph nodes:  No pathologically enlarged lymph nodes.  Ascites:  Trace ascites evident in the posterior pelvic recess.  Gastrointestinal: Stomach shows irregular wall thickening. This is seen diffusely but is most prominent involving the gastric antrum porta measures 15 mm in thickness. No ulceration.  There is distention of most of the colon. The splenic flexure has a maximum diameter of 8.8 cm. No colonic wall thickening or adjacent inflammation is seen. There are loops of mildly dilated small bowel with other areas of decompressed small bowel. No discrete small bowel mass or wall thickening is seen.  Mesentery: Small soft tissue nodules are seen adjacent to the stomach. Some of the apparent nodules appear to be prominent vascularity. Other discrete nodules may be due to prominent lymph nodes. Possibility of subtle peritoneal metastatic disease should be considered.  Vascular: Mild generalized ectasia of the abdominal aorta without a focal aneurysm. There is atherosclerotic calcification along the abdominal aorta and its branch vessels.  MUSCULOSKELETAL  No osteoblastic or osteolytic lesions.  MISCELLANEOUS  There is generalized soft tissue edema most evident in the lower abdomen and pelvis.  IMPRESSION: 1. Diffuse wall thickening of the stomach consistent with the given history of gastric adenocarcinoma. No definite CT evidence of extension of neoplastic disease beyond the stomach serosa. 2. Distention of the majority of the colon and multiple loops of small bowel with other areas of small bowel being decompressed. This may reflect an adynamic ileus. No discrete level of obstruction is seen. 3. Small nodular areas of soft tissue seen adjacent to the stomach. This could reflect local peritoneal metastatic disease or sub cm neoplastic adenopathy. 4. No evidence of metastatic disease in the chest. There small pleural effusions with associated dependent  atelectasis. 5. Small hypo attenuating liver lesions. These are nonspecific. They may all be due to cysts. Metastatic disease is possible. 6. No other evidence of metastatic disease.   Electronically Signed   By: Lajean Manes M.D.   On: 04/01/2015 19:37   Ct Head Wo Contrast  04/01/2015   CLINICAL DATA:  Confusion/ altered mental status for 1 day  EXAM: CT HEAD WITHOUT CONTRAST  TECHNIQUE: Contiguous axial images were obtained from the base of the skull through the vertex without intravenous contrast.  COMPARISON:  None.  FINDINGS: There is moderate diffuse atrophy. There is no intracranial mass, hemorrhage, extra-axial fluid collection, or midline shift. There is moderate small vessel disease throughout the centra semiovale bilaterally. There is no acute infarct evident. The bony calvarium appears intact. The mastoid air cells are clear.  IMPRESSION: Atrophy with periventricular small vessel disease. No acute infarct evident. No hemorrhage or mass effect.   Electronically Signed   By: Lowella Grip III M.D.   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 Presented with hypotension, worsening anemia and frank melanotic stools 1 post admission. Started on PPI, GI consulted-underwent EGD on 7/29-findings concerning for gastric cancer-confirmed adenocarcinoma by  biopsy.  EXAM: CT CHEST, ABDOMEN AND PELVIS WITHOUT CONTRAST  TECHNIQUE: Multidetector CT imaging of the chest, abdomen and pelvis was performed following the standard protocol without IV contrast.  COMPARISON:  None.  FINDINGS: CT CHEST FINDINGS  Thoracic inlet: No mass or adenopathy. Thyroid gland is unremarkable.  Mediastinum and hila: Heart normal in size. Mild coronary artery calcifications. Great vessels are  normal in caliber for age. Mild atherosclerotic calcifications along the thoracic aorta and at the origins of the aortic branch vessels. No mediastinal or hilar masses or pathologically enlarged lymph nodes.  Lungs and pleura: Significant elevation of left hemidiaphragm. Small bilateral pleural effusions. There is dependent atelectasis mostly in the lower lobes. No lung mass or suspicious nodule. No lung consolidation or edema. Tracheobronchial tree is patent. Paraseptal emphysema noted in the upper lobes at the apices where there is also pleural parenchymal scarring.  CT ABDOMEN AND PELVIS FINDINGS  Liver: Scattered small low-density lesions, mostly subcentimeter. Largest cyst seen centrally measuring 13 mm. These are nonspecific. They may reflect cysts. Metastatic lesions are possible. No other liver abnormality.  Spleen, gallbladder, pancreas, adrenal glands:  Unremarkable.  Kidneys, ureters, bladder: No renal masses. Small nonobstructing stone seen in each kidney. Mild diffuse renal cortical thinning. No hydronephrosis. Chronic appearing bilateral perinephric stranding. Ureters normal course and in caliber. Bladder is unremarkable.  Prostate gland: Multiple radiation therapy seeds. Fat planes between the prostate, seminal vesicles and bladder are relatively well preserved.  Lymph nodes:  No pathologically enlarged lymph nodes.  Ascites:  Trace ascites evident in the posterior pelvic recess.  Gastrointestinal: Stomach shows irregular wall thickening. This is seen diffusely but is most prominent involving the gastric antrum porta measures 15 mm in thickness. No ulceration.  There is distention of most of the colon. The splenic flexure has a maximum diameter of 8.8 cm. No colonic wall thickening or adjacent inflammation is seen. There are loops of mildly dilated small bowel with other areas of decompressed small bowel. No discrete small bowel mass or wall thickening is seen.  Mesentery: Small soft tissue nodules  are seen adjacent to the stomach. Some of the apparent nodules appear to be prominent vascularity. Other discrete nodules may be due to prominent lymph nodes. Possibility of subtle peritoneal metastatic disease should be considered.  Vascular: Mild generalized ectasia of the abdominal aorta without a focal aneurysm. There is atherosclerotic calcification along the abdominal aorta and its branch vessels.  MUSCULOSKELETAL  No osteoblastic or osteolytic lesions.  MISCELLANEOUS  There is generalized soft tissue edema most evident in the lower abdomen and pelvis.  IMPRESSION: 1. Diffuse wall thickening of the stomach consistent with the given history of gastric adenocarcinoma. No definite CT evidence of extension of neoplastic disease beyond the stomach serosa. 2. Distention of the majority of the colon and multiple loops of small bowel with other areas of small bowel being decompressed. This may reflect an adynamic ileus. No discrete level of obstruction is seen. 3. Small nodular areas of soft tissue seen adjacent to the stomach. This could reflect local peritoneal metastatic disease or sub cm neoplastic adenopathy. 4. No evidence of metastatic disease in the chest. There small pleural effusions with associated dependent atelectasis. 5. Small hypo attenuating liver lesions. These are nonspecific. They may all be due to cysts. Metastatic disease is possible. 6. No other evidence of metastatic disease.   Electronically Signed   By: Lajean Manes M.D.   On: 04/01/2015 19:37   Mr Brain Wo Contrast  04/02/2015  CLINICAL DATA:  Confusion.  Weakness.  Gastric adenocarcinoma.  EXAM: MRI HEAD WITHOUT CONTRAST  TECHNIQUE: Multiplanar, multiecho pulse sequences of the brain and surrounding structures were obtained without intravenous contrast.  COMPARISON:  CT without contrast 04/01/2015.  FINDINGS: Moderate generalized atrophy and diffuse white matter changes are noted bilaterally. No acute infarct, hemorrhage, or mass is  present. The ventricles are proportionate to the degree of atrophy.  Flow is present in the major intracranial arteries. The globes and orbits are intact. The paranasal sinuses are clear. Mastoid air cells are clear.  Skullbase is within normal limits. Midline structures are unremarkable.  IMPRESSION: 1. No acute intracranial abnormality. 2. No evidence for metastatic disease to the brain. 3. Moderate atrophy and diffuse white matter disease. This likely reflects the sequela of chronic microvascular ischemia.   Electronically Signed   By: San Morelle M.D.   On: 04/02/2015 17:18   Nm Pulmonary Perf And Vent  04/02/2015   CLINICAL DATA:  Shortness of Breath  EXAM: NUCLEAR MEDICINE VENTILATION - PERFUSION LUNG SCAN  Views: Anterior, posterior, LPO, RPO, LAO, RAO-ventilation and perfusion  Radionuclide: Technetium 99 m DTPA-ventilation; Technetium 55mmacroaggregated albumin-perfusion  Dose:  40.0 mCi-ventilation; 6.0 mCi-perfusion  Route of administration: Inhalation -ventilation ; intravenous -perfusion  COMPARISON:  Chest radiograph April 02, 2015  FINDINGS: Ventilation: Radiotracer uptake is homogeneous and symmetric bilaterally. There is elevation of the left hemidiaphragm. No focal ventilation defects are identified.  Perfusion: There is elevation of the left hemidiaphragm. Radiotracer uptake is homogeneous and symmetric bilaterally. There are no appreciable perfusion defects.  IMPRESSION: No appreciable ventilation or perfusion defects. This study constitutes a very low probability of pulmonary embolus.   Electronically Signed   By: WLowella GripIII M.D.   On: 04/02/2015 16:48   Dg Chest Port 1 View  04/02/2015   CLINICAL DATA:  Shortness of breath.  EXAM: PORTABLE CHEST - 1 VIEW  COMPARISON:  CT 04/01/2015.  Chest x-ray 03/27/2015.  FINDINGS: Mediastinum hilar structures normal. Lungs are clear. Elevation of the left hemidiaphragm again noted. Mild subsegmental atelectasis left lung base. Small  left pleural effusion cannot be excluded. Interposition of the colon under the left hemidiaphragm again noted. No free air . No acute bony abnormality.  IMPRESSION: 1. Stable elevation hemidiaphragm . 2. Mild left base subsegmental atelectasis. Small left pleural effusion cannot be excluded .   Electronically Signed   By: TMarcello Moores Register   On: 04/02/2015 11:32   Dg Chest Port 1 View  03/27/2015   CLINICAL DATA:  Weakness, fatigue, shortness of breath for 2 days. Ex-smoker  EXAM: PORTABLE CHEST - 1 VIEW  COMPARISON:  None.  FINDINGS: There is marked elevation of the left hemidiaphragm, of uncertain acuity. Lungs appear to be at least mildly hyperexpanded suggesting some degree of COPD. Lungs appear clear. No evidence of pneumonia. No pleural effusion. No pneumothorax.  Osseous structures are unremarkable. Cardiomediastinal silhouette appears normal in size and configuration, although a portion of the left heart border is obscured by the left hemidiaphragm elevation.  IMPRESSION: Lungs appear hyperexpanded suggesting some degree of underlying COPD.  Lungs are clear.  No evidence of acute cardiopulmonary abnormality.  Marked elevation of the left hemidiaphragm, of uncertain acuity but probably chronic.   Electronically Signed   By: SFranki CabotM.D.   On: 03/27/2015 14:55      Filed Weights   04/02/15 0500 04/03/15 0500 04/04/15 0522  Weight: 86.818 kg (191 lb 6.4 oz) 85.458 kg (188 lb 6.4  oz) 87 kg (191 lb 12.8 oz)     Microbiology: Recent Results (from the past 240 hour(s))  Culture, blood (routine x 2)     Status: None   Collection Time: 03/27/15  4:56 PM  Result Value Ref Range Status   Specimen Description BLOOD LEFT ARM  Final   Special Requests BOTTLES DRAWN AEROBIC AND ANAEROBIC 5CC  Final   Culture  Setup Time   Final    GRAM POSITIVE COCCI IN CLUSTERS AEROBIC BOTTLE ONLY CRITICAL RESULT CALLED TO, READ BACK BY AND VERIFIED WITH: J KOME 03/28/15 @ 36 M VESTAL    Culture   Final     STAPHYLOCOCCUS SPECIES (COAGULASE NEGATIVE) THE SIGNIFICANCE OF ISOLATING THIS ORGANISM FROM A SINGLE SET OF BLOOD CULTURES WHEN MULTIPLE SETS ARE DRAWN IS UNCERTAIN. PLEASE NOTIFY THE MICROBIOLOGY DEPARTMENT WITHIN ONE WEEK IF SPECIATION AND SENSITIVITIES ARE REQUIRED.    Report Status 03/30/2015 FINAL  Final  Culture, blood (routine x 2)     Status: None   Collection Time: 03/28/15  8:06 PM  Result Value Ref Range Status   Specimen Description BLOOD RIGHT HAND  Final   Special Requests BOTTLES DRAWN AEROBIC ONLY 5CC  Final   Culture NO GROWTH 5 DAYS  Final   Report Status 04/02/2015 FINAL  Final  Culture, blood (routine x 2)     Status: None   Collection Time: 03/28/15  8:37 PM  Result Value Ref Range Status   Specimen Description BLOOD RIGHT HAND  Final   Special Requests BOTTLES DRAWN AEROBIC ONLY 5CC  Final   Culture NO GROWTH 5 DAYS  Final   Report Status 04/02/2015 FINAL  Final       Blood Culture    Component Value Date/Time   SDES BLOOD RIGHT HAND 03/28/2015 2037   SPECREQUEST BOTTLES DRAWN AEROBIC ONLY 5CC 03/28/2015 2037   CULT NO GROWTH 5 DAYS 03/28/2015 2037   REPTSTATUS 04/02/2015 FINAL 03/28/2015 2037      Labs: Results for orders placed or performed during the hospital encounter of 03/27/15 (from the past 48 hour(s))  CBC     Status: Abnormal   Collection Time: 04/03/15  3:11 AM  Result Value Ref Range   WBC 7.8 4.0 - 10.5 K/uL   RBC 3.19 (L) 4.22 - 5.81 MIL/uL   Hemoglobin 9.2 (L) 13.0 - 17.0 g/dL   HCT 28.3 (L) 39.0 - 52.0 %   MCV 88.7 78.0 - 100.0 fL   MCH 28.8 26.0 - 34.0 pg   MCHC 32.5 30.0 - 36.0 g/dL   RDW 15.2 11.5 - 15.5 %   Platelets 286 150 - 400 K/uL  Comprehensive metabolic panel     Status: Abnormal   Collection Time: 04/03/15  3:11 AM  Result Value Ref Range   Sodium 138 135 - 145 mmol/L   Potassium 4.8 3.5 - 5.1 mmol/L   Chloride 111 101 - 111 mmol/L   CO2 22 22 - 32 mmol/L   Glucose, Bld 106 (H) 65 - 99 mg/dL   BUN 16 6 - 20  mg/dL   Creatinine, Ser 1.34 (H) 0.61 - 1.24 mg/dL   Calcium 8.1 (L) 8.9 - 10.3 mg/dL   Total Protein 4.0 (L) 6.5 - 8.1 g/dL   Albumin 1.9 (L) 3.5 - 5.0 g/dL   AST 30 15 - 41 U/L   ALT 27 17 - 63 U/L   Alkaline Phosphatase 41 38 - 126 U/L   Total Bilirubin 0.3 0.3 - 1.2 mg/dL  GFR calc non Af Amer 48 (L) >60 mL/min   GFR calc Af Amer 56 (L) >60 mL/min    Comment: (NOTE) The eGFR has been calculated using the CKD EPI equation. This calculation has not been validated in all clinical situations. eGFR's persistently <60 mL/min signify possible Chronic Kidney Disease.    Anion gap 5 5 - 15     Lipid Panel  No results found for: CHOL, TRIG, HDL, CHOLHDL, VLDL, LDLCALC, LDLDIRECT   No results found for: HGBA1C   Lab Results  Component Value Date   CREATININE 1.34* 04/03/2015     HPI :81 y.o. male admitted on 03/27/15 with hypotension after taking antihypertensives, in the setting of 2-3 week history of poor oral intake with subsequent weight loss. He did not report nausea, heartburn or change in bowel habits, but did notice melanotic stools. No respiratory complaints. No chest pain. No dysuria or hematuria. No skin rashes, or neuropathy. He did notice dizziness as well without syncopal episodes. At the time of evaluation patient is confused, history is obtained from chart. Hb on admission was 7.0 requiring transfusion. GI consultation as upper GI bleed was suspected. EGD on 7/29 showed near circumferential mass in the cardia. 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 Miguel Lawson is the probable cause of his UGI bleed causing anemia and melena. Diagnosis ([id number]) was consistent with Adenocarcinoma. . Staging CTs pending. No family history of GI cancer. Last colonoscopy prior to this admission was done remotely  HOSPITAL COURSE:  Hypotension: Resolved.Likely secondary to hypovolemia from GI bleed and continued use of anti-hypertensive medications. Blood pressure  much better following IV fluid resuscitation and transfusion of 2 units of PRBC. . Follow closely.  Acute blood loss anemia: Secondary to Suspected upper GI bleed. Transfused 2 units of PRBC-following which Hb   .Follow hemoglobin closely. Hemoglobin 9.2 prior to discharge. Continue twice a day PPI  Upper GI bleed: Presented with hypotension, worsening anemia and frank melanotic stools 1 post admission. Started on PPI twice a day, GI consulted-underwent EGD on 7/29-findings concerning for gastric cancer-confirmed adenocarcinoma by biopsy.Oncology consulted, CT chest and abdomen without contrast shows fairly localised disease with peritoneal metastatic disease . MRI of the brain was negative for metastasis. Oncology and radiation oncology recommend concomitant radiation and chemotherapy. Patient is scheduled for simulation on 8/5    Acute on chronic kidney disease stage III: Likely prerenal azotemia in a setting of acute blood loss and continued antihypertensives use (Micardis HCTZ). 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 Dr. Lisbeth Renshaw  plans a 2-5 week course of palliative radiation based on Mr. Kempker ability to return for daily radiation.  Dr. Benay Spice  will administer concurrent weekly chemotherapy if Dr. Lisbeth Renshaw plans a 5 week course.   Sinus Tachycardia: Improved, lower extremity Doppler negative. No evidence of recurrence of melena-hemoglobin stable. Much better with low-dose metoprolol. Patient continues to be tachycardic. VQ scan negative for PE  Coag neg staph bacteremia: Likely contamination-repeat blood culture negative.  Discontinued vancomycin-and monitor off Abx  History of hypertension: start low dose metoprolol-BP more improved.   Known severe malnutrition: continue supplements  ? EtOH use: denies significant EtOH use to me-monitor for withdrawals.  Discharge Exam:    Blood pressure 97/63, pulse 96, temperature 98 F (36.7 C), temperature source Oral, resp. rate 18, height 5' 11"  (1.803 m), weight 87 kg (191 lb 12.8 oz), SpO2 99 %.  Gen Exam: Awake and alert with clear speech. He looks chronically ill looking and pale. Neck: Supple, No JVD.  Chest: B/L Clear. No rales or rhonchi CVS: S1 S2 Regular, no murmurs.  Abdomen: soft, BS +, non tender, non distended.  Extremities: no edema, lower extremities warm to touch. Neurologic: Non Focal.  Skin: No Rash.    Discharge Instructions    Diet - low sodium heart healthy    Complete by:  As directed      Increase activity slowly    Complete by:  As directed            Follow-up Information    Follow up with Donnajean Lopes, MD. Schedule an appointment as soon as possible for a visit in 3 days.   Specialty:  Internal Medicine   Contact information:   [address] [place] [address] [phone]       Follow up with Kyung Rudd, MD. Schedule an appointment as soon as possible for a visit on 04/04/2015.   Specialty:  Radiation Oncology   Why:  simulation tomorrow, 04/04/15 at [facility] information:   [address] [place] [address] [phone]       Follow up with Betsy Coder, MD. Schedule an appointment as soon as possible for a visit in 3 days.   Specialty:  Oncology   Contact information:   [place] [address] [phone]       Signed: Reyne Dumas 04/04/2015, 11:52 AM        Time spent >45 mins

## 2015-04-04 NOTE — Telephone Encounter (Signed)
Called [facility] , spoke with Charge RN Robin, patient stable, has IV access, on 2 liters O2, telemetry, asked her to have patinet cocme to the cancer center at [place], via [place]  And have him here for 1030 am CT simulation mark and start, thanked Shirlean Mylar, Agricultural consultant then called  Aaron Edelman CT SIM RT and gave status of patient 8:13 AM

## 2015-04-06 DIAGNOSIS — Z79899 Other long term (current) drug therapy: Secondary | ICD-10-CM | POA: Diagnosis not present

## 2015-04-06 DIAGNOSIS — Z510 Encounter for antineoplastic radiation therapy: Secondary | ICD-10-CM | POA: Diagnosis present

## 2015-04-06 DIAGNOSIS — C169 Malignant neoplasm of stomach, unspecified: Secondary | ICD-10-CM | POA: Diagnosis not present

## 2015-04-07 ENCOUNTER — Other Ambulatory Visit: Payer: Self-pay | Admitting: *Deleted

## 2015-04-08 ENCOUNTER — Other Ambulatory Visit: Payer: Self-pay | Admitting: *Deleted

## 2015-04-08 ENCOUNTER — Telehealth: Payer: Self-pay | Admitting: Oncology

## 2015-04-08 NOTE — Telephone Encounter (Signed)
Called and left a message with 8/11 chemo class

## 2015-04-09 ENCOUNTER — Ambulatory Visit
Admit: 2015-04-09 | Discharge: 2015-04-09 | Disposition: A | Discharge status: Home / Self Care | Payer: Medicare Other | Attending: Radiation Oncology | Admitting: Radiation Oncology

## 2015-04-09 ENCOUNTER — Ambulatory Visit
Admission: RE | Admit: 2015-04-09 | Discharge: 2015-04-09 | Disposition: A | Discharge status: Home / Self Care | Payer: Medicare Other | Source: Ambulatory Visit | Attending: Radiation Oncology | Admitting: Radiation Oncology

## 2015-04-09 DIAGNOSIS — Z510 Encounter for antineoplastic radiation therapy: Secondary | ICD-10-CM | POA: Diagnosis not present

## 2015-04-09 NOTE — Progress Notes (Signed)
Pt here for patient teaching.  Pt given Radiation and You booklet, skin care instructions and Radiaplex gel.  Reviewed areas of pertinence such as diarrhea, fatigue, hair loss, nausea and vomiting, skin changes, urinary and bladder changes and taste changes . Pt able to give teach back of to pat skin, use unscented/gentle soap, use baby wipes, have Imodium on hand and drink plenty of water,apply Radiaplex bid and avoid applying anything to skin within 4 hours of treatment. Pt demonstrated understanding and needs reinforcement of information given and will contact nursing with any questions or concerns.  My bsuiness card given as well, will reionforce on Friday with Daughter and patient, 1:50 PM

## 2015-04-10 ENCOUNTER — Ambulatory Visit (HOSPITAL_BASED_OUTPATIENT_CLINIC_OR_DEPARTMENT_OTHER): Payer: Medicare Other | Admitting: Nurse Practitioner

## 2015-04-10 ENCOUNTER — Encounter: Payer: Self-pay | Admitting: *Deleted

## 2015-04-10 ENCOUNTER — Ambulatory Visit
Admit: 2015-04-10 | Discharge: 2015-04-10 | Disposition: A | Discharge status: Home / Self Care | Payer: Medicare Other | Attending: Radiation Oncology | Admitting: Radiation Oncology

## 2015-04-10 ENCOUNTER — Telehealth: Payer: Self-pay | Admitting: Nurse Practitioner

## 2015-04-10 ENCOUNTER — Other Ambulatory Visit (HOSPITAL_BASED_OUTPATIENT_CLINIC_OR_DEPARTMENT_OTHER): Payer: Medicare Other

## 2015-04-10 ENCOUNTER — Telehealth: Payer: Self-pay | Admitting: Oncology

## 2015-04-10 ENCOUNTER — Other Ambulatory Visit: Payer: Medicare Other

## 2015-04-10 VITALS — BP 94/64 | HR 95 | Temp 97.9°F | Resp 17 | Ht 71.0 in | Wt 176.2 lb

## 2015-04-10 DIAGNOSIS — C160 Malignant neoplasm of cardia: Secondary | ICD-10-CM

## 2015-04-10 DIAGNOSIS — K922 Gastrointestinal hemorrhage, unspecified: Secondary | ICD-10-CM | POA: Diagnosis not present

## 2015-04-10 DIAGNOSIS — C169 Malignant neoplasm of stomach, unspecified: Secondary | ICD-10-CM | POA: Insufficient documentation

## 2015-04-10 DIAGNOSIS — N289 Disorder of kidney and ureter, unspecified: Secondary | ICD-10-CM | POA: Diagnosis not present

## 2015-04-10 DIAGNOSIS — D500 Iron deficiency anemia secondary to blood loss (chronic): Secondary | ICD-10-CM

## 2015-04-10 DIAGNOSIS — Z510 Encounter for antineoplastic radiation therapy: Secondary | ICD-10-CM | POA: Diagnosis not present

## 2015-04-10 LAB — CBC WITH DIFFERENTIAL/PLATELET
BASO%: 0.1 % (ref 0.0–2.0)
BASOS ABS: 0 10*3/uL (ref 0.0–0.1)
EOS%: 0.2 % (ref 0.0–7.0)
Eosinophils Absolute: 0 10*3/uL (ref 0.0–0.5)
HCT: 31.8 % — ABNORMAL LOW (ref 38.4–49.9)
HEMOGLOBIN: 10.3 g/dL — AB (ref 13.0–17.1)
LYMPH%: 6.6 % — AB (ref 14.0–49.0)
MCH: 29 pg (ref 27.2–33.4)
MCHC: 32.4 g/dL (ref 32.0–36.0)
MCV: 89.6 fL (ref 79.3–98.0)
MONO#: 0.7 10*3/uL (ref 0.1–0.9)
MONO%: 6.7 % (ref 0.0–14.0)
NEUT#: 9.4 10*3/uL — ABNORMAL HIGH (ref 1.5–6.5)
NEUT%: 86.4 % — ABNORMAL HIGH (ref 39.0–75.0)
PLATELETS: 424 10*3/uL — AB (ref 140–400)
RBC: 3.55 10*6/uL — AB (ref 4.20–5.82)
RDW: 14.4 % (ref 11.0–14.6)
WBC: 10.9 10*3/uL — AB (ref 4.0–10.3)
lymph#: 0.7 10*3/uL — ABNORMAL LOW (ref 0.9–3.3)

## 2015-04-10 LAB — COMPREHENSIVE METABOLIC PANEL (CC13)
ALK PHOS: 65 U/L (ref 40–150)
ALT: 26 U/L (ref 0–55)
AST: 23 U/L (ref 5–34)
Albumin: 2.1 g/dL — ABNORMAL LOW (ref 3.5–5.0)
Anion Gap: 10 mEq/L (ref 3–11)
BUN: 16.9 mg/dL (ref 7.0–26.0)
CALCIUM: 7.9 mg/dL — AB (ref 8.4–10.4)
CO2: 22 mEq/L (ref 22–29)
CREATININE: 1.4 mg/dL — AB (ref 0.7–1.3)
Chloride: 106 mEq/L (ref 98–109)
EGFR: 57 mL/min/{1.73_m2} — ABNORMAL LOW (ref >90)
GLUCOSE: 82 mg/dL (ref 70–140)
POTASSIUM: 4 meq/L (ref 3.5–5.1)
Sodium: 138 mEq/L (ref 136–145)
Total Bilirubin: 0.26 mg/dL (ref 0.20–1.20)
Total Protein: 5 g/dL — ABNORMAL LOW (ref 6.4–8.3)

## 2015-04-10 NOTE — Progress Notes (Addendum)
  [facility] OFFICE PROGRESS NOTE   Diagnosis:  Gastric cancer  INTERVAL HISTORY:   [name] returns for a first outpatient follow-up visit since discharge from the hospital. He was hospitalized 03/27/2015 through 04/04/2015 presenting with hypotension, failure to thrive. He was found to be anemic. Upper GI bleed was suspected. He underwent an EGD on 03/28/2015 which showed a near circumferential mass in the cardia. Biopsy showed adenocarcinoma. Staging CT scans showed no evidence of distant metastatic disease. He was seen by Dr. Benay Spice during the hospitalization with recommendation for palliative chemotherapy and radiation.  He feels stronger. His poor physical therapy. No nausea. No dysphagia. He reports poor oral intake because he doesn't like the food served at the facility.  Objective:  Vital signs in last 24 hours:  Blood pressure 94/64, pulse 95, temperature 97.9 F (36.6 C), temperature source Oral, resp. rate 17, height 5\' 11"  (1.803 m), weight 176 lb 3.2 oz (79.924 kg), SpO2 95 %.    HEENT: No thrush or ulcers. Lymphatics: No cervical or supraclavicular lymph nodes. Resp: Faint rales right lung base. No respiratory distress. Cardio: Regular rate and rhythm. GI: Abdomen soft and nontender. No hepatomegaly. Vascular: Trace lower leg edema bilaterally.  Lab Results:  Lab Results  Component Value Date   WBC 10.9* 04/10/2015   HGB 10.3* 04/10/2015   HCT 31.8* 04/10/2015   MCV 89.6 04/10/2015   PLT 424* 04/10/2015   NEUTROABS 9.4* 04/10/2015    Imaging:  No results found.  Medications: I have reviewed the patient's current medications.  Assessment/Plan: 1. Gastric cancer; EGD on 03/28/2015 which showed a near circumferential mass in the cardia. Biopsy showed adenocarcinoma. Staging CT scans showed diffuse wall thickening of the stomach; small nodular areas of soft tissue seen adjacent to the stomach; small hypoattenuating liver lesions.  Initiation of radiation 04/09/2015. 2. GI bleeding secondary to #1 3. Anemia secondary to GI bleeding 4. Renal insufficiency    Disposition: [name] appears stable. He began radiation on 04/09/2015. Dr. Benay Spice recommends concurrent weekly Taxol/carboplatin chemotherapy. We reviewed potential toxicities associated with chemotherapy including myelosuppression, nausea, mouth sores, diarrhea or constipation, hair loss. We discussed the potential for an allergic reaction with either carboplatin or Taxol. We discussed the potential for peripheral neuropathy and arthralgias with Taxol. He is agreeable to proceed. He will attend a chemotherapy education class. He will return for the first weekly treatment on 04/17/2015. We will see him in follow-up on 04/21/2015. He will contact the office in the interim with any problems.  Patient seen with Dr. Benay Spice.   Ned Card ANP/GNP-BC   04/10/2015  4:33 PM This was a shared visit with Ned Card. [name] has been diagnosed with locally advanced gastric cancer. The tumor is centered at the gastric cardia. We discussed treatment options with [name] and his daughter. They understand he could have more advanced disease than recognized on the staging CT.  He began radiation yesterday and agrees to begin concurrent weekly Taxol/carboplatin. I reviewed the potential toxicities associated with this chemotherapy regimen and he agrees to proceed.  Julieanne Manson, M.D.

## 2015-04-10 NOTE — Telephone Encounter (Signed)
Call Placed to [facility] to fax over Mr. Banet med list. Spoke with Nira Conn who contacted [place] Mr. Kling nurse. Received fax successfully.8 pages

## 2015-04-10 NOTE — Telephone Encounter (Signed)
per pt to r/s chemo class appt-gave pt avs

## 2015-04-10 NOTE — Telephone Encounter (Signed)
[place] to speak with Nurse taking care of [name] Spoke with Apolonio Schneiders on [address] hall the floor where Mr.Terrick stays relayed message from Dr. Benay Spice to stop taking metoprolol due to Hypotension.

## 2015-04-10 NOTE — Telephone Encounter (Signed)
per pof to sch pt appt-sent MW email toe sch pt trmt-will call daughter once reply to adv of time and get updated sch to her @ radiation check in

## 2015-04-10 NOTE — Telephone Encounter (Signed)
-----   Message from Owens Shark, NP sent at 04/10/2015  4:31 PM EDT ----- Please contact the nursing facility and have them discontinue metoprolol due to hypotension.

## 2015-04-10 NOTE — Progress Notes (Signed)
[place] Work  Clinical Social Work was referred by Futures trader for assessment of psychosocial needs due to transportation concerns.  Clinical Social Worker met with patient and his daughter at [facility] to offer support and assess for needs.  Pt currently is at [facility] for PT/OT rehab for about 20 days. Pt currently needs assistance with transfers and this makes transportation options difficult to arrange that are also cost effective. CSW reviewed possible options; SCAT, Lucianne Lei transport out of pocket, ACS and [employer] as options. Daughter to call [employer] and the [facility] to seek assistance. CSW also attempted to problem solve with daughter on ways to see if other family members could assist. Daughter is juggling care of her 81yo grandmother and a job and caregiver stress is a concern. CSW provided SCAT application, Family Support Team handout and calendar. CSW to follow up with pt and daughter at radiation appt on 8/12 to get completed SCAT application and submit it to [employer].     Clinical Social Work interventions: Resource education Supportive listening  Loren Racer, [place] Worker [place]  [facility] Phone: [phone] Fax: [phone]

## 2015-04-11 ENCOUNTER — Telehealth: Payer: Self-pay | Admitting: *Deleted

## 2015-04-11 ENCOUNTER — Encounter: Payer: Self-pay | Admitting: Radiation Oncology

## 2015-04-11 ENCOUNTER — Ambulatory Visit
Admission: RE | Admit: 2015-04-11 | Discharge: 2015-04-11 | Disposition: A | Discharge status: Home / Self Care | Payer: Medicare Other | Source: Ambulatory Visit | Attending: Radiation Oncology | Admitting: Radiation Oncology

## 2015-04-11 VITALS — BP 94/59 | HR 120 | Temp 98.0°F | Resp 20

## 2015-04-11 DIAGNOSIS — Z510 Encounter for antineoplastic radiation therapy: Secondary | ICD-10-CM | POA: Diagnosis not present

## 2015-04-11 DIAGNOSIS — C169 Malignant neoplasm of stomach, unspecified: Secondary | ICD-10-CM

## 2015-04-11 NOTE — Telephone Encounter (Signed)
Per staff message and POF I have scheduled appts. Advised scheduler of appts. JMW  

## 2015-04-11 NOTE — Progress Notes (Addendum)
Weekly rad txs 3/25 completed,  No c/o pain gets nauseated with solid food, drinking ensure plus right now, he was stopped on his b/p meds with low b/p's at [facility], bowels okay with stookl softner, last one yesteray ,  No pain stated, took ortho vitals, sitting T=98.0,B/P=89/52,P=110,RR=20, weighed yesteray wih Ned Card NP, 176lb yesterday, Standing b/p=94/59, P=112, room air 100%,

## 2015-04-11 NOTE — Progress Notes (Signed)
  Radiation Oncology         [phone]     Name: Benjamin Lawson MRN: [id number]   Date: 04/11/2015  DOB: [birth year]   Weekly Radiation Therapy Management    ICD-9-CM ICD-10-CM   1. Gastric cancer 151.9 C16.9     Current Dose: 5.4 Gy  Planned Dose:  45 Gy  Narrative The patient presents for routine under treatment assessment. Weekly rad txs 3/25 completed, No c/o pain gets nauseated with solid food, drinking ensure plus right now, he was stopped on his b/p meds with low b/p's at [facility]hab, bowels okay with stool softner, last one yesterday. No pain stated. Reports he drinks as much water as he can.  The patient is without complaint. Set-up films were reviewed. The chart was checked.  Physical Findings  oral temperature is 98 F (36.7 C). His blood pressure is 94/59 and his pulse is 120. His respiration is 20. . Weight essentially stable.  No significant changes. Low blood pressure noted.   Impression The patient is tolerating radiation.  Plan Continue treatment as planned. Suggested for the patient to increase his hydration and protein intake to help with the low blood pressure.     This document serves as a record of services personally performed by Tyler Pita, MD. It was created on his behalf by Arlyce Harman, a trained medical scribe. The creation of this record is based on the scribe's personal observations and the provider's statements to them. This document has been checked and approved by the attending provider.       Sheral Apley Tammi Klippel, M.D.

## 2015-04-13 ENCOUNTER — Other Ambulatory Visit: Payer: Self-pay | Admitting: Oncology

## 2015-04-14 ENCOUNTER — Other Ambulatory Visit: Payer: Self-pay | Admitting: Hematology

## 2015-04-14 ENCOUNTER — Ambulatory Visit
Admission: RE | Admit: 2015-04-14 | Discharge: 2015-04-14 | Disposition: A | Discharge status: Home / Self Care | Payer: Medicare Other | Source: Ambulatory Visit | Attending: Radiation Oncology | Admitting: Radiation Oncology

## 2015-04-14 DIAGNOSIS — Z510 Encounter for antineoplastic radiation therapy: Secondary | ICD-10-CM | POA: Diagnosis not present

## 2015-04-15 ENCOUNTER — Other Ambulatory Visit: Payer: Medicare Other

## 2015-04-15 ENCOUNTER — Ambulatory Visit
Admit: 2015-04-15 | Discharge: 2015-04-15 | Disposition: A | Discharge status: Home / Self Care | Payer: Medicare Other | Attending: Radiation Oncology | Admitting: Radiation Oncology

## 2015-04-15 DIAGNOSIS — Z510 Encounter for antineoplastic radiation therapy: Secondary | ICD-10-CM | POA: Diagnosis not present

## 2015-04-16 ENCOUNTER — Telehealth: Payer: Self-pay | Admitting: *Deleted

## 2015-04-16 ENCOUNTER — Encounter: Payer: Self-pay | Admitting: *Deleted

## 2015-04-16 ENCOUNTER — Other Ambulatory Visit: Payer: Medicare Other

## 2015-04-16 ENCOUNTER — Ambulatory Visit
Admit: 2015-04-16 | Discharge: 2015-04-16 | Disposition: A | Discharge status: Home / Self Care | Payer: Medicare Other | Attending: Radiation Oncology | Admitting: Radiation Oncology

## 2015-04-16 DIAGNOSIS — Z510 Encounter for antineoplastic radiation therapy: Secondary | ICD-10-CM | POA: Diagnosis not present

## 2015-04-16 MED ORDER — DEXAMETHASONE 4 MG PO TABS: ORAL_TABLET | ORAL | Status: DC

## 2015-04-16 MED ORDER — PROCHLORPERAZINE MALEATE 10 MG PO TABS: 5.0000 mg | ORAL_TABLET | Freq: Four times a day (QID) | ORAL | Status: DC | PRN

## 2015-04-16 NOTE — Telephone Encounter (Signed)
Left non-descriptive VM regarding prescriptions; no answer. Compazine and decadron sent to pharmacy on pt's file.

## 2015-04-17 ENCOUNTER — Other Ambulatory Visit (HOSPITAL_BASED_OUTPATIENT_CLINIC_OR_DEPARTMENT_OTHER): Payer: Medicare Other

## 2015-04-17 ENCOUNTER — Ambulatory Visit (HOSPITAL_BASED_OUTPATIENT_CLINIC_OR_DEPARTMENT_OTHER): Payer: Medicare Other

## 2015-04-17 ENCOUNTER — Encounter: Payer: Self-pay | Admitting: *Deleted

## 2015-04-17 ENCOUNTER — Ambulatory Visit
Admission: RE | Admit: 2015-04-17 | Discharge: 2015-04-17 | Disposition: A | Discharge status: Home / Self Care | Payer: Medicare Other | Source: Ambulatory Visit | Attending: Radiation Oncology | Admitting: Radiation Oncology

## 2015-04-17 ENCOUNTER — Other Ambulatory Visit: Payer: Self-pay | Admitting: *Deleted

## 2015-04-17 VITALS — BP 94/60 | HR 108 | Temp 97.6°F | Resp 20

## 2015-04-17 DIAGNOSIS — C160 Malignant neoplasm of cardia: Secondary | ICD-10-CM

## 2015-04-17 DIAGNOSIS — Z5111 Encounter for antineoplastic chemotherapy: Secondary | ICD-10-CM | POA: Diagnosis present

## 2015-04-17 DIAGNOSIS — Z510 Encounter for antineoplastic radiation therapy: Secondary | ICD-10-CM | POA: Diagnosis not present

## 2015-04-17 DIAGNOSIS — C169 Malignant neoplasm of stomach, unspecified: Secondary | ICD-10-CM

## 2015-04-17 LAB — CBC WITH DIFFERENTIAL/PLATELET
BASO%: 0.3 % (ref 0.0–2.0)
Basophils Absolute: 0 10*3/uL (ref 0.0–0.1)
EOS%: 0.6 % (ref 0.0–7.0)
Eosinophils Absolute: 0 10*3/uL (ref 0.0–0.5)
HEMATOCRIT: 35.6 % — AB (ref 38.4–49.9)
HGB: 11.7 g/dL — ABNORMAL LOW (ref 13.0–17.1)
LYMPH#: 0.5 10*3/uL — AB (ref 0.9–3.3)
LYMPH%: 7.4 % — ABNORMAL LOW (ref 14.0–49.0)
MCH: 29 pg (ref 27.2–33.4)
MCHC: 32.7 g/dL (ref 32.0–36.0)
MCV: 88.5 fL (ref 79.3–98.0)
MONO#: 0.5 10*3/uL (ref 0.1–0.9)
MONO%: 7.4 % (ref 0.0–14.0)
NEUT%: 84.3 % — ABNORMAL HIGH (ref 39.0–75.0)
NEUTROS ABS: 6 10*3/uL (ref 1.5–6.5)
PLATELETS: 417 10*3/uL — AB (ref 140–400)
RBC: 4.03 10*6/uL — AB (ref 4.20–5.82)
RDW: 14.2 % (ref 11.0–14.6)
WBC: 7.2 10*3/uL (ref 4.0–10.3)

## 2015-04-17 LAB — COMPREHENSIVE METABOLIC PANEL (CC13)
ALT: 28 U/L (ref 0–55)
ANION GAP: 9 meq/L (ref 3–11)
AST: 28 U/L (ref 5–34)
Albumin: 2.1 g/dL — ABNORMAL LOW (ref 3.5–5.0)
Alkaline Phosphatase: 58 U/L (ref 40–150)
BUN: 18.9 mg/dL (ref 7.0–26.0)
CALCIUM: 8.2 mg/dL — AB (ref 8.4–10.4)
CHLORIDE: 103 meq/L (ref 98–109)
CO2: 24 meq/L (ref 22–29)
Creatinine: 1.1 mg/dL (ref 0.7–1.3)
EGFR: 72 mL/min/{1.73_m2} — ABNORMAL LOW (ref >90)
Glucose: 101 mg/dl (ref 70–140)
Potassium: 3.8 mEq/L (ref 3.5–5.1)
Sodium: 136 mEq/L (ref 136–145)
TOTAL PROTEIN: 5.1 g/dL — AB (ref 6.4–8.3)
Total Bilirubin: 0.24 mg/dL (ref 0.20–1.20)

## 2015-04-17 MED ORDER — SODIUM CHLORIDE 0.9 % IV SOLN
Freq: Once | INTRAVENOUS | Status: AC
  Administered 2015-04-17: 09:00:00 via INTRAVENOUS

## 2015-04-17 MED ORDER — FAMOTIDINE IN NACL 20-0.9 MG/50ML-% IV SOLN
INTRAVENOUS | Status: AC
  Filled 2015-04-17: qty 50

## 2015-04-17 MED ORDER — PACLITAXEL CHEMO INJECTION 300 MG/50ML
50.0000 mg/m2 | Freq: Once | INTRAVENOUS | Status: AC
  Administered 2015-04-17: 102 mg via INTRAVENOUS
  Filled 2015-04-17: qty 17

## 2015-04-17 MED ORDER — DIPHENHYDRAMINE HCL 50 MG/ML IJ SOLN
INTRAMUSCULAR | Status: AC
  Filled 2015-04-17: qty 1

## 2015-04-17 MED ORDER — SODIUM CHLORIDE 0.9 % IV SOLN
Freq: Once | INTRAVENOUS | Status: AC
  Administered 2015-04-17: 10:00:00 via INTRAVENOUS
  Filled 2015-04-17: qty 8

## 2015-04-17 MED ORDER — DIPHENHYDRAMINE HCL 50 MG/ML IJ SOLN
25.0000 mg | Freq: Once | INTRAMUSCULAR | Status: AC
  Administered 2015-04-17: 25 mg via INTRAVENOUS

## 2015-04-17 MED ORDER — SODIUM CHLORIDE 0.9 % IV SOLN
171.0000 mg | Freq: Once | INTRAVENOUS | Status: AC
  Administered 2015-04-17: 170 mg via INTRAVENOUS
  Filled 2015-04-17: qty 17

## 2015-04-17 MED ORDER — FAMOTIDINE IN NACL 20-0.9 MG/50ML-% IV SOLN
20.0000 mg | Freq: Once | INTRAVENOUS | Status: AC
  Administered 2015-04-17: 20 mg via INTRAVENOUS

## 2015-04-17 NOTE — Progress Notes (Signed)
Oncology Nurse Navigator Documentation  Oncology Nurse Navigator Flowsheets 04/17/2015  Navigator Encounter Type Treatment  Patient Visit Type Radonc  Treatment Phase First Chemo Tx  Barriers/Navigation Needs No barriers at this time  Time Spent with Patient 5  Met briefly with patient after his RT/chemo today and provided my contact information and nurse navigation roll. He is currently in SNF. Was introduced to his wife's niece. Prior to admission, Benjamin Lawson was living alone.  

## 2015-04-17 NOTE — Patient Instructions (Signed)
[facility] Discharge Instructions for Patients Receiving Chemotherapy  Today you received the following chemotherapy agents:  Taxol and Carboplatin  To help prevent nausea and vomiting after your treatment, we encourage you to take your nausea medication as ordered per MD.   If you develop nausea and vomiting that is not controlled by your nausea medication, call the clinic.   BELOW ARE SYMPTOMS THAT SHOULD BE REPORTED IMMEDIATELY:  *FEVER GREATER THAN 100.5 F  *CHILLS WITH OR WITHOUT FEVER  NAUSEA AND VOMITING THAT IS NOT CONTROLLED WITH YOUR NAUSEA MEDICATION  *UNUSUAL SHORTNESS OF BREATH  *UNUSUAL BRUISING OR BLEEDING  TENDERNESS IN MOUTH AND THROAT WITH OR WITHOUT PRESENCE OF ULCERS  *URINARY PROBLEMS  *BOWEL PROBLEMS  UNUSUAL RASH Items with * indicate a potential emergency and should be followed up as soon as possible.  Feel free to call the clinic you have any questions or concerns. The clinic phone number is [phone].  Please show the CHEMO ALERT CARD at check-in to the Emergency Department and triage nurse.   

## 2015-04-18 ENCOUNTER — Ambulatory Visit
Admission: RE | Admit: 2015-04-18 | Discharge: 2015-04-18 | Disposition: A | Discharge status: Home / Self Care | Payer: Medicare Other | Source: Ambulatory Visit | Attending: Radiation Oncology | Admitting: Radiation Oncology

## 2015-04-18 ENCOUNTER — Telehealth: Payer: Self-pay | Admitting: *Deleted

## 2015-04-18 ENCOUNTER — Encounter: Payer: Self-pay | Admitting: Radiation Oncology

## 2015-04-18 VITALS — BP 91/67 | HR 105 | Temp 97.6°F | Resp 20 | Wt 178.6 lb

## 2015-04-18 DIAGNOSIS — Z510 Encounter for antineoplastic radiation therapy: Secondary | ICD-10-CM | POA: Diagnosis not present

## 2015-04-18 DIAGNOSIS — C169 Malignant neoplasm of stomach, unspecified: Secondary | ICD-10-CM

## 2015-04-18 NOTE — Progress Notes (Signed)
   Department of Radiation Oncology  Phone:  [phone] Fax:        [phone]  Weekly Treatment Note    Name: Benjamin Lawson Date: 04/18/2015 MRN: [id number] DOB: 1933/05/22   Current dose: 14.4 Gy  Current fraction:8   MEDICATIONS: Current Outpatient Prescriptions  Medication Sig Dispense Refill  . atorvastatin (LIPITOR) 20 MG tablet Take 20 mg by mouth daily.    Marland Kitchen ENSURE PLUS (ENSURE PLUS) LIQD Take 237 mLs by mouth 2 (two) times daily between meals.    . pantoprazole (PROTONIX) 40 MG tablet Take 1 tablet (40 mg total) by mouth 2 (two) times daily before a meal. 60 tablet 2  . prochlorperazine (COMPAZINE) 10 MG tablet Take 0.5 tablets (5 mg total) by mouth every 6 (six) hours as needed for nausea or vomiting. 30 tablet 0  . sennosides-docusate sodium (SENOKOT-S) 8.6-50 MG tablet Take 1 tablet by mouth daily.    Marland Kitchen tolterodine (DETROL LA) 4 MG 24 hr capsule Take 4 mg by mouth daily.    Marland Kitchen dexamethasone (DECADRON) 4 MG tablet Take 10 mg by mouth once 04/16/15 the night before 1st chemotherapy only. (Patient not taking: Reported on 04/18/2015) 2.5 tablet 0  . lactose free nutrition (BOOST) LIQD Take 237 mLs by mouth 2 (two) times daily with a meal.    . metoprolol succinate (TOPROL-XL) 50 MG 24 hr tablet Take 50 mg by mouth daily. Take with or immediately following a meal.     No current facility-administered medications for this encounter.     ALLERGIES: Review of patient's allergies indicates no known allergies.   LABORATORY DATA:  Lab Results  Component Value Date   WBC 7.2 04/17/2015   HGB 11.7* 04/17/2015   HCT 35.6* 04/17/2015   MCV 88.5 04/17/2015   PLT 417* 04/17/2015   Lab Results  Component Value Date   NA 136 04/17/2015   K 3.8 04/17/2015   CL 111 04/03/2015   CO2 24 04/17/2015   Lab Results  Component Value Date   ALT 28 04/17/2015   AST 28 04/17/2015   ALKPHOS 58 04/17/2015   BILITOT 0.24 04/17/2015     NARRATIVE: Benjamin Lawson was seen  today for weekly treatment management. The chart was checked and the patient's films were reviewed.  Weekly rad txs abdomen 8/25 compleetd, States he is eating better now no gagging or nausea, drinks ensure 1 can daily with meals, no pain, no abd pains or gas, takes stool softener daily, bowel movements regular now, in w/c, weak still, sees Ernestene Kiel Monday after rad txs. Patient states he is feeling better overall in comparison to last week's treatments.   PHYSICAL EXAMINATION: weight is 178 lb 9.6 oz (81.012 kg). His oral temperature is 97.6 F (36.4 C). His blood pressure is 91/67 and his pulse is 105. His respiration is 20.        ASSESSMENT: The patient is doing satisfactorily with treatment.  PLAN: We will continue with the patient's radiation treatment as planned.   ------------------------------------------------  Jodelle Gross, MD, PhD  This document serves as a record of services personally performed by Kyung Rudd, MD. It was created on his behalf by Derek Mound, a trained medical scribe. The creation of this record is based on the scribe's personal observations and the provider's statements to them. This document has been checked and approved by the attending provider.

## 2015-04-18 NOTE — Progress Notes (Signed)
  Radiation Oncology         [phone]) [phone] ________________________________  Name: Benjamin Lawson MRN: [id number]  Date: 04/04/2015  DOB: 13-Apr-1933  SIMULATION AND TREATMENT PLANNING NOTE  DIAGNOSIS:     ICD-9-CM ICD-10-CM   1. Gastric cancer 151.9 C16.9      Site:  abdomen  NARRATIVE:  The patient was brought to the [facility].  Identity was confirmed.  All relevant records and images related to the planned course of therapy were reviewed.   Written consent to proceed with treatment was confirmed which was freely given after reviewing the details related to the planned course of therapy had been reviewed with the patient.  Then, the patient was set-up in a stable reproducible  supine position for radiation therapy.  CT images were obtained.  Surface markings were placed.    Medically necessary complex treatment device(s) for immobilization:  Customized vac lock bag.   The CT images were loaded into the planning software.  Then the target and avoidance structures were contoured.  Treatment planning then occurred.  The radiation prescription was entered and confirmed.  A total of 5 complex treatment devices were fabricated which relate to the designed radiation treatment fields. Each of these customized fields/ complex treatment devices will be used on a daily basis during the radiation course. I have requested : 3D Simulation  I have requested a DVH of the following structures: target volume, spinal cord, liver.   The patient will undergo daily image guidance to ensure accurate localization of the target, and adequate minimize dose to the normal surrounding structures in close proximity to the target.   PLAN:  The patient will receive 45 Gy in 25 fractions initially. The patient is anticipated to then receive a 5.4 gray boost treatment for a total dose of 50.4 gray.   Special treatment procedure The patient will also receive concurrent chemotherapy during the treatment.  The patient may therefore experience increased toxicity or side effects and the patient will be monitored for such problems. This may require extra lab work as necessary. This therefore constitutes a special treatment procedure.  ________________________________   Jodelle Gross, MD, PhD

## 2015-04-18 NOTE — Telephone Encounter (Signed)
Unable to reach pt via cellphone; was able to reach pt's daughter, Benjamin Lawson, who stated pt was feeling okay, no complications. She stated she spoke to pt this morning and he reports eating well with no issues. No other needs identified at this time. Daughter requests we call her if we need anything, as pt can be confused at times. Daughter instructed to call us if any concerns arise.

## 2015-04-18 NOTE — Progress Notes (Signed)
Weekly rad txs abdomen 8/25 compleetd,  States he is eating better now no gagging or nausea, drinks ensure 1 can  daily with meals  , no pain, no abd pains or gas, takes stool softener daily, bowel movements regular now, in w/c, weak still, ses Barbara Nef Monday after rad txs 3:07 PM BP 91/67 mmHg  Pulse 105  Temp(Src) 97.6 F (36.4 C) (Oral)  Resp 20  Wt 178 lb 9.6 oz (81.012 kg)  Wt Readings from Last 3 Encounters:  04/18/15 178 lb 9.6 oz (81.012 kg)  04/10/15 176 lb 3.2 oz (79.924 kg)  04/04/15 191 lb 12.8 oz (87 kg)   .

## 2015-04-20 ENCOUNTER — Other Ambulatory Visit: Payer: Self-pay | Admitting: Oncology

## 2015-04-21 ENCOUNTER — Other Ambulatory Visit (HOSPITAL_BASED_OUTPATIENT_CLINIC_OR_DEPARTMENT_OTHER): Payer: Medicare Other

## 2015-04-21 ENCOUNTER — Ambulatory Visit: Payer: Medicare Other | Admitting: Oncology

## 2015-04-21 ENCOUNTER — Telehealth: Payer: Self-pay | Admitting: Oncology

## 2015-04-21 ENCOUNTER — Ambulatory Visit (HOSPITAL_BASED_OUTPATIENT_CLINIC_OR_DEPARTMENT_OTHER): Payer: Medicare Other | Admitting: Oncology

## 2015-04-21 ENCOUNTER — Encounter: Payer: Self-pay | Admitting: Oncology

## 2015-04-21 ENCOUNTER — Ambulatory Visit
Admit: 2015-04-21 | Discharge: 2015-04-21 | Disposition: A | Discharge status: Home / Self Care | Payer: Medicare Other | Attending: Radiation Oncology | Admitting: Radiation Oncology

## 2015-04-21 ENCOUNTER — Ambulatory Visit: Payer: Medicare Other | Admitting: Nutrition

## 2015-04-21 VITALS — BP 94/53 | HR 124 | Temp 98.1°F | Resp 18 | Ht 71.0 in | Wt 174.8 lb

## 2015-04-21 DIAGNOSIS — C169 Malignant neoplasm of stomach, unspecified: Secondary | ICD-10-CM

## 2015-04-21 DIAGNOSIS — K922 Gastrointestinal hemorrhage, unspecified: Secondary | ICD-10-CM | POA: Diagnosis not present

## 2015-04-21 DIAGNOSIS — C160 Malignant neoplasm of cardia: Secondary | ICD-10-CM

## 2015-04-21 DIAGNOSIS — N289 Disorder of kidney and ureter, unspecified: Secondary | ICD-10-CM

## 2015-04-21 DIAGNOSIS — Z510 Encounter for antineoplastic radiation therapy: Secondary | ICD-10-CM | POA: Diagnosis not present

## 2015-04-21 DIAGNOSIS — K5900 Constipation, unspecified: Secondary | ICD-10-CM | POA: Diagnosis not present

## 2015-04-21 DIAGNOSIS — D500 Iron deficiency anemia secondary to blood loss (chronic): Secondary | ICD-10-CM | POA: Diagnosis not present

## 2015-04-21 LAB — CBC WITH DIFFERENTIAL/PLATELET
BASO%: 0.2 % (ref 0.0–2.0)
BASOS ABS: 0 10*3/uL (ref 0.0–0.1)
EOS ABS: 0 10*3/uL (ref 0.0–0.5)
EOS%: 0.1 % (ref 0.0–7.0)
HCT: 34.5 % — ABNORMAL LOW (ref 38.4–49.9)
HGB: 11.4 g/dL — ABNORMAL LOW (ref 13.0–17.1)
LYMPH%: 1.6 % — AB (ref 14.0–49.0)
MCH: 29 pg (ref 27.2–33.4)
MCHC: 33 g/dL (ref 32.0–36.0)
MCV: 87.9 fL (ref 79.3–98.0)
MONO#: 0.2 10*3/uL (ref 0.1–0.9)
MONO%: 2.1 % (ref 0.0–14.0)
NEUT#: 8.7 10*3/uL — ABNORMAL HIGH (ref 1.5–6.5)
NEUT%: 96 % — ABNORMAL HIGH (ref 39.0–75.0)
Platelets: 314 10*3/uL (ref 140–400)
RBC: 3.93 10*6/uL — AB (ref 4.20–5.82)
RDW: 14.6 % (ref 11.0–14.6)
WBC: 9.1 10*3/uL (ref 4.0–10.3)
lymph#: 0.1 10*3/uL — ABNORMAL LOW (ref 0.9–3.3)

## 2015-04-21 NOTE — Progress Notes (Signed)
  [facility] OFFICE PROGRESS NOTE   Diagnosis:  Gastric cancer  INTERVAL HISTORY:   Mr. Lakins returns for follow-up following his first cycle of chemotherapy given with XRT. Tolerated well. He feels stronger. No nausea. No dysphagia. Reports loose stool, but still receiving Senokot S daily. He reports poor oral intake because he doesn't like the food served at the facility. Drinking Boost regularly. Weight is stable.   Objective:  Vital signs in last 24 hours:  Blood pressure 94/53, pulse 124, temperature 98.1 F (36.7 C), temperature source Oral, resp. rate 18, height 5\' 11"  (1.803 m), weight 174 lb 12.8 oz (79.289 kg), SpO2 100 %.    HEENT: No thrush or ulcers. Lymphatics: No cervical or supraclavicular lymph nodes. Resp: Faint rales right lung base. No respiratory distress. Cardio: Regular rate and rhythm. GI: Abdomen soft and nontender. No hepatomegaly. Vascular: Trace lower leg edema bilaterally.  Lab Results:  Lab Results  Component Value Date   WBC 9.1 04/21/2015   HGB 11.4* 04/21/2015   HCT 34.5* 04/21/2015   MCV 87.9 04/21/2015   PLT 314 04/21/2015   NEUTROABS 8.7* 04/21/2015    Imaging:  No results found.  Medications: I have reviewed the patient's current medications.  Assessment/Plan: 1. Gastric cancer; EGD on 03/28/2015 which showed a near circumferential mass in the cardia. Biopsy showed adenocarcinoma. Staging CT scans showed diffuse wall thickening of the stomach; small nodular areas of soft tissue seen adjacent to the stomach; small hypoattenuating liver lesions. Initiation of radiation 04/09/2015. 2. GI bleeding secondary to #1 3. Anemia secondary to GI bleeding 4. Renal insufficiency    Disposition: Mr. Tsang appears stable. He began radiation on 04/09/2015. Concurrent weekly Taxol/carboplatin chemotherapy was started on 04/17/2015. He has poor IV access and is scheduled to have a PAC placed later this week.   Instructions  given to bring to the skilled facility to change Senokot to 1 tab daily PRN for constipation.  Patient will meet with dietician today.  We will see him in follow-up on 05/08/2015. He will contact the office in the interim with any problems.  Patient discussed with Dr. Benay Spice.   Mikey Bussing DNP, AGPCNP-BC, AOCNP   04/21/2015  3:33 PM

## 2015-04-21 NOTE — Progress Notes (Signed)
81 year old man diagnosed with gastric cancer to receive concurrent chemoradiation therapy.  He is a patient of Dr. Julieanne Manson.  Past medical history includes hypertension, hypercholesterolemia, chronic kidney disease stage II, PVD, and prostate cancer.  Medications include Lipitor, Decadron, Protonix, Compazine, and Senokot-S.  Labs include albumin 2.1 on August 18.  Height: 5 feet 11 inches. Weight: 174.8 pounds August 22. Usual body weight: 192 pounds August 5. BMI: 24.39.  Patient resides at [facility]. He reports he cannot eat any foods solid or pured. He tolerates liquids and has consumed boost plus in the past. Patient denies nausea and vomiting. Reports he had diarrhea but he was taking Senokot on a daily basis.   Nutrition diagnosis:  Unintended weight loss related to poor appetite and inadequate oral intake as evidenced by 9% weight loss in 2 weeks.  Intervention: Recommended patient increase Ensure Plus or boost plus to a minimum of 4 bottles daily working towards a goal of 6 bottles daily. Encouraged patient to try pured foods and other liquids throughout treatment. Educated patient on the importance of adequate nutrition to minimize further weight loss. Provided samples of oral nutrition supplements and coupons. Questions were answered.  Teach back method used.  Contact information was given.  Monitoring, evaluation, goals: Patient will tolerate increased calories and protein to minimize further weight loss.  Next visit: Thursday September 8 during infusion.  **Disclaimer: This note was dictated with voice recognition software. Similar sounding words can inadvertently be transcribed and this note may contain transcription errors which may not have been corrected upon publication of note.**

## 2015-04-21 NOTE — Telephone Encounter (Signed)
per pof to sch pt appt-gave pt copy of avs °

## 2015-04-22 ENCOUNTER — Other Ambulatory Visit: Payer: Self-pay | Admitting: Radiology

## 2015-04-22 ENCOUNTER — Ambulatory Visit
Admission: RE | Admit: 2015-04-22 | Discharge: 2015-04-22 | Disposition: A | Discharge status: Home / Self Care | Payer: Medicare Other | Source: Ambulatory Visit | Attending: Radiation Oncology | Admitting: Radiation Oncology

## 2015-04-22 DIAGNOSIS — Z510 Encounter for antineoplastic radiation therapy: Secondary | ICD-10-CM | POA: Diagnosis not present

## 2015-04-23 ENCOUNTER — Encounter (HOSPITAL_COMMUNITY): Payer: Self-pay

## 2015-04-23 ENCOUNTER — Ambulatory Visit (HOSPITAL_COMMUNITY)
Admission: RE | Admit: 2015-04-23 | Discharge: 2015-04-23 | Disposition: A | Discharge status: Home / Self Care | Payer: Medicare Other | Source: Ambulatory Visit | Attending: Oncology | Admitting: Oncology

## 2015-04-23 ENCOUNTER — Ambulatory Visit: Payer: Medicare Other

## 2015-04-23 ENCOUNTER — Other Ambulatory Visit: Payer: Self-pay | Admitting: Oncology

## 2015-04-23 ENCOUNTER — Ambulatory Visit
Admission: RE | Admit: 2015-04-23 | Discharge: 2015-04-23 | Disposition: A | Discharge status: Home / Self Care | Payer: Medicare Other | Source: Ambulatory Visit | Attending: Radiation Oncology | Admitting: Radiation Oncology

## 2015-04-23 DIAGNOSIS — Z8546 Personal history of malignant neoplasm of prostate: Secondary | ICD-10-CM | POA: Insufficient documentation

## 2015-04-23 DIAGNOSIS — N183 Chronic kidney disease, stage 3 (moderate): Secondary | ICD-10-CM | POA: Insufficient documentation

## 2015-04-23 DIAGNOSIS — D649 Anemia, unspecified: Secondary | ICD-10-CM | POA: Diagnosis not present

## 2015-04-23 DIAGNOSIS — E780 Pure hypercholesterolemia: Secondary | ICD-10-CM | POA: Diagnosis not present

## 2015-04-23 DIAGNOSIS — I2510 Atherosclerotic heart disease of native coronary artery without angina pectoris: Secondary | ICD-10-CM | POA: Insufficient documentation

## 2015-04-23 DIAGNOSIS — I739 Peripheral vascular disease, unspecified: Secondary | ICD-10-CM | POA: Insufficient documentation

## 2015-04-23 DIAGNOSIS — I129 Hypertensive chronic kidney disease with stage 1 through stage 4 chronic kidney disease, or unspecified chronic kidney disease: Secondary | ICD-10-CM | POA: Insufficient documentation

## 2015-04-23 DIAGNOSIS — Z87891 Personal history of nicotine dependence: Secondary | ICD-10-CM | POA: Insufficient documentation

## 2015-04-23 DIAGNOSIS — C169 Malignant neoplasm of stomach, unspecified: Secondary | ICD-10-CM

## 2015-04-23 MED ORDER — CEFAZOLIN SODIUM-DEXTROSE 2-3 GM-% IV SOLR
INTRAVENOUS | Status: DC
Start: 2015-04-23 — End: 2015-04-24
  Filled 2015-04-23: qty 50

## 2015-04-23 MED ORDER — CEFAZOLIN SODIUM-DEXTROSE 2-3 GM-% IV SOLR
2.0000 g | Freq: Once | INTRAVENOUS | Status: AC
  Administered 2015-04-23: 2 g via INTRAVENOUS

## 2015-04-23 MED ORDER — LIDOCAINE-EPINEPHRINE 2 %-1:100000 IJ SOLN
INTRAMUSCULAR | Status: AC
  Filled 2015-04-23: qty 1

## 2015-04-23 MED ORDER — FENTANYL CITRATE (PF) 100 MCG/2ML IJ SOLN
INTRAMUSCULAR | Status: AC
  Filled 2015-04-23: qty 2

## 2015-04-23 MED ORDER — MIDAZOLAM HCL 2 MG/2ML IJ SOLN
INTRAMUSCULAR | Status: AC
  Filled 2015-04-23: qty 2

## 2015-04-23 MED ORDER — SODIUM CHLORIDE 0.9 % IV SOLN: Freq: Once | INTRAVENOUS | Status: DC

## 2015-04-23 MED ORDER — HEPARIN SOD (PORK) LOCK FLUSH 100 UNIT/ML IV SOLN
INTRAVENOUS | Status: AC
  Filled 2015-04-23: qty 5

## 2015-04-23 NOTE — Progress Notes (Signed)
Recovery period completed during computer downtime.  Downtime forms used and sent to medical records.

## 2015-04-23 NOTE — H&P (Signed)
Chief Complaint: Patient was seen in consultation today for gastric cancer at the request of Sherrill,Gary B  Referring Physician(s): Sherrill,Gary B  History of Present Illness: Benjamin Lawson is a 81 y.o. male   Ongoing treatment for gastric adenocarcinoma Chemo therapy and radiation therapy Request for [facility] a cath placement   Past Medical History  Diagnosis Date  . Hypertension   . Hypercholesterolemia   . Chronic kidney disease (CKD), stage II (mild)   . Deafness in left ear   . Peripheral vascular disease   . Prostate cancer     Past Surgical History  Procedure Laterality Date  . Multiple tooth extractions  01/2015    "all my lower teeth"  . Transurethral resection of prostate  1990's  . Esophagogastroduodenoscopy N/A 03/28/2015    Procedure: ESOPHAGOGASTRODUODENOSCOPY (EGD);  Surgeon: Richmond Campbell, MD;  Location: [facility];  Service: Endoscopy;  Laterality: N/A;    Allergies: Review of patient's allergies indicates no known allergies.  Medications: Prior to Admission medications   Medication Sig Start Date End Date Taking? Authorizing Provider  atorvastatin (LIPITOR) 20 MG tablet Take 20 mg by mouth daily.   Yes Historical Provider, MD  ENSURE PLUS (ENSURE PLUS) LIQD Take 237 mLs by mouth 2 (two) times daily between meals.   Yes Historical Provider, MD  metoprolol succinate (TOPROL-XL) 50 MG 24 hr tablet Take 50 mg by mouth daily. Take with or immediately following a meal.   Yes Historical Provider, MD  pantoprazole (PROTONIX) 40 MG tablet Take 1 tablet (40 mg total) by mouth 2 (two) times daily before a meal. 04/03/15  Yes Reyne Dumas, MD  sennosides-docusate sodium (SENOKOT-S) 8.6-50 MG tablet Take 1 tablet by mouth daily.   Yes Historical Provider, MD  tolterodine (DETROL LA) 4 MG 24 hr capsule Take 4 mg by mouth daily.   Yes Historical Provider, MD  dexamethasone (DECADRON) 4 MG tablet Take 10 mg by mouth once 04/16/15 the night before 1st chemotherapy  only. Patient not taking: Reported on 04/18/2015 04/16/15   Ladell Pier, MD  prochlorperazine (COMPAZINE) 10 MG tablet Take 0.5 tablets (5 mg total) by mouth every 6 (six) hours as needed for nausea or vomiting. Patient not taking: Reported on 04/23/2015 04/16/15   Ladell Pier, MD     History reviewed. No pertinent family history.  Social History   Social History  . Marital Status: Widowed    Spouse Name: N/A  . Number of Children: N/A  . Years of Education: N/A   Social History Main Topics  . Smoking status: Former Smoker -- 0.50 packs/day for 5 years    Types: Cigarettes  . Smokeless tobacco: Never Used     Comment: "stopped smoking in the 1960's  . Alcohol Use: 1.2 oz/week    2 Shots of liquor per week     Comment: 03/27/2015 "6 pack of crown royals/month"  . Drug Use: No  . Sexual Activity: No   Other Topics Concern  . None   Social History Narrative     Review of Systems: A 12 point ROS discussed and pertinent positives are indicated in the HPI above.  All other systems are negative.  Review of Systems  Constitutional: Positive for activity change, appetite change, fatigue and unexpected weight change.  Respiratory: Negative for shortness of breath.   Gastrointestinal: Positive for abdominal pain and abdominal distention.  Neurological: Positive for weakness.  Psychiatric/Behavioral: Negative for behavioral problems and confusion.     Vital Signs:  BP 103/77 mmHg  Pulse 101  Temp(Src) 98.1 F (36.7 C) (Oral)  Resp 18  SpO2 94%  Physical Exam  Constitutional: He is oriented to person, place, and time.  Cardiovascular: Normal rate, regular rhythm and normal heart sounds.   Pulmonary/Chest: Effort normal and breath sounds normal. He has no wheezes.  Abdominal: Bowel sounds are normal. He exhibits distension. There is tenderness.  Musculoskeletal: Normal range of motion. He exhibits edema.  Edema noted all extremities  Neurological: He is alert and  oriented to person, place, and time.  Skin: Skin is warm and dry.  Psychiatric: He has a normal mood and affect. His behavior is normal. Judgment and thought content normal.  Nursing note and vitals reviewed.   Mallampati Score:  MD Evaluation Airway: WNL Heart: WNL Abdomen: WNL Chest/ Lungs: WNL ASA  Classification: 3 Mallampati/Airway Score: One  Imaging: Ct Abdomen Pelvis Wo Contrast  04/01/2015   CLINICAL DATA:  Adenocarcinoma of stomach:confirmed by endoscopy with biopsy.Oncology consulted, unfortunately looks like patient has stage III chronic kidney disease, hence will order CT chest and abdomen without contrast. Presented with hypotension, worsening anemia and frank melanotic stools 1 post admission. Started on PPI, GI consulted-underwent EGD on 7/29-findings concerning for gastric cancer-confirmed adenocarcinoma by biopsy.  EXAM: CT CHEST, ABDOMEN AND PELVIS WITHOUT CONTRAST  TECHNIQUE: Multidetector CT imaging of the chest, abdomen and pelvis was performed following the standard protocol without IV contrast.  COMPARISON:  None.  FINDINGS: CT CHEST FINDINGS  Thoracic inlet: No mass or adenopathy. Thyroid gland is unremarkable.  Mediastinum and hila: Heart normal in size. Mild coronary artery calcifications. Great vessels are normal in caliber for age. Mild atherosclerotic calcifications along the thoracic aorta and at the origins of the aortic branch vessels. No mediastinal or hilar masses or pathologically enlarged lymph nodes.  Lungs and pleura: Significant elevation of left hemidiaphragm. Small bilateral pleural effusions. There is dependent atelectasis mostly in the lower lobes. No lung mass or suspicious nodule. No lung consolidation or edema. Tracheobronchial tree is patent. Paraseptal emphysema noted in the upper lobes at the apices where there is also pleural parenchymal scarring.  CT ABDOMEN AND PELVIS FINDINGS  Liver: Scattered small low-density lesions, mostly subcentimeter.  Largest cyst seen centrally measuring 13 mm. These are nonspecific. They may reflect cysts. Metastatic lesions are possible. No other liver abnormality.  Spleen, gallbladder, pancreas, adrenal glands:  Unremarkable.  Kidneys, ureters, bladder: No renal masses. Small nonobstructing stone seen in each kidney. Mild diffuse renal cortical thinning. No hydronephrosis. Chronic appearing bilateral perinephric stranding. Ureters normal course and in caliber. Bladder is unremarkable.  Prostate gland: Multiple radiation therapy seeds. Fat planes between the prostate, seminal vesicles and bladder are relatively well preserved.  Lymph nodes:  No pathologically enlarged lymph nodes.  Ascites:  Trace ascites evident in the posterior pelvic recess.  Gastrointestinal: Stomach shows irregular wall thickening. This is seen diffusely but is most prominent involving the gastric antrum porta measures 15 mm in thickness. No ulceration.  There is distention of most of the colon. The splenic flexure has a maximum diameter of 8.8 cm. No colonic wall thickening or adjacent inflammation is seen. There are loops of mildly dilated small bowel with other areas of decompressed small bowel. No discrete small bowel mass or wall thickening is seen.  Mesentery: Small soft tissue nodules are seen adjacent to the stomach. Some of the apparent nodules appear to be prominent vascularity. Other discrete nodules may be due to prominent lymph nodes. Possibility of subtle  peritoneal metastatic disease should be considered.  Vascular: Mild generalized ectasia of the abdominal aorta without a focal aneurysm. There is atherosclerotic calcification along the abdominal aorta and its branch vessels.  MUSCULOSKELETAL  No osteoblastic or osteolytic lesions.  MISCELLANEOUS  There is generalized soft tissue edema most evident in the lower abdomen and pelvis.  IMPRESSION: 1. Diffuse wall thickening of the stomach consistent with the given history of gastric  adenocarcinoma. No definite CT evidence of extension of neoplastic disease beyond the stomach serosa. 2. Distention of the majority of the colon and multiple loops of small bowel with other areas of small bowel being decompressed. This may reflect an adynamic ileus. No discrete level of obstruction is seen. 3. Small nodular areas of soft tissue seen adjacent to the stomach. This could reflect local peritoneal metastatic disease or sub cm neoplastic adenopathy. 4. No evidence of metastatic disease in the chest. There small pleural effusions with associated dependent atelectasis. 5. Small hypo attenuating liver lesions. These are nonspecific. They may all be due to cysts. Metastatic disease is possible. 6. No other evidence of metastatic disease.   Electronically Signed   By: Lajean Manes M.D.   On: 04/01/2015 19:37   Ct Head Wo Contrast  04/01/2015   CLINICAL DATA:  Confusion/ altered mental status for 1 day  EXAM: CT HEAD WITHOUT CONTRAST  TECHNIQUE: Contiguous axial images were obtained from the base of the skull through the vertex without intravenous contrast.  COMPARISON:  None.  FINDINGS: There is moderate diffuse atrophy. There is no intracranial mass, hemorrhage, extra-axial fluid collection, or midline shift. There is moderate small vessel disease throughout the centra semiovale bilaterally. There is no acute infarct evident. The bony calvarium appears intact. The mastoid air cells are clear.  IMPRESSION: Atrophy with periventricular small vessel disease. No acute infarct evident. No hemorrhage or mass effect.   Electronically Signed   By: Lowella Grip III M.D.   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 Presented with hypotension, worsening anemia and frank  melanotic stools 1 post admission. Started on PPI, GI consulted-underwent EGD on 7/29-findings concerning for gastric cancer-confirmed adenocarcinoma by biopsy.  EXAM: CT CHEST, ABDOMEN AND PELVIS WITHOUT CONTRAST  TECHNIQUE: Multidetector CT imaging of the chest, abdomen and pelvis was performed following the standard protocol without IV contrast.  COMPARISON:  None.  FINDINGS: CT CHEST FINDINGS  Thoracic inlet: No mass or adenopathy. Thyroid gland is unremarkable.  Mediastinum and hila: Heart normal in size. Mild coronary artery calcifications. Great vessels are normal in caliber for age. Mild atherosclerotic calcifications along the thoracic aorta and at the origins of the aortic branch vessels. No mediastinal or hilar masses or pathologically enlarged lymph nodes.  Lungs and pleura: Significant elevation of left hemidiaphragm. Small bilateral pleural effusions. There is dependent atelectasis mostly in the lower lobes. No lung mass or suspicious nodule. No lung consolidation or edema. Tracheobronchial tree is patent. Paraseptal emphysema noted in the upper lobes at the apices where there is also pleural parenchymal scarring.  CT ABDOMEN AND PELVIS FINDINGS  Liver: Scattered small low-density lesions, mostly subcentimeter. Largest cyst seen centrally measuring 13 mm. These are nonspecific. They may reflect cysts. Metastatic lesions are possible. No other liver abnormality.  Spleen, gallbladder, pancreas, adrenal glands:  Unremarkable.  Kidneys, ureters,  bladder: No renal masses. Small nonobstructing stone seen in each kidney. Mild diffuse renal cortical thinning. No hydronephrosis. Chronic appearing bilateral perinephric stranding. Ureters normal course and in caliber. Bladder is unremarkable.  Prostate gland: Multiple radiation therapy seeds. Fat planes between the prostate, seminal vesicles and bladder are relatively well preserved.  Lymph nodes:  No pathologically enlarged lymph nodes.  Ascites:  Trace ascites  evident in the posterior pelvic recess.  Gastrointestinal: Stomach shows irregular wall thickening. This is seen diffusely but is most prominent involving the gastric antrum porta measures 15 mm in thickness. No ulceration.  There is distention of most of the colon. The splenic flexure has a maximum diameter of 8.8 cm. No colonic wall thickening or adjacent inflammation is seen. There are loops of mildly dilated small bowel with other areas of decompressed small bowel. No discrete small bowel mass or wall thickening is seen.  Mesentery: Small soft tissue nodules are seen adjacent to the stomach. Some of the apparent nodules appear to be prominent vascularity. Other discrete nodules may be due to prominent lymph nodes. Possibility of subtle peritoneal metastatic disease should be considered.  Vascular: Mild generalized ectasia of the abdominal aorta without a focal aneurysm. There is atherosclerotic calcification along the abdominal aorta and its branch vessels.  MUSCULOSKELETAL  No osteoblastic or osteolytic lesions.  MISCELLANEOUS  There is generalized soft tissue edema most evident in the lower abdomen and pelvis.  IMPRESSION: 1. Diffuse wall thickening of the stomach consistent with the given history of gastric adenocarcinoma. No definite CT evidence of extension of neoplastic disease beyond the stomach serosa. 2. Distention of the majority of the colon and multiple loops of small bowel with other areas of small bowel being decompressed. This may reflect an adynamic ileus. No discrete level of obstruction is seen. 3. Small nodular areas of soft tissue seen adjacent to the stomach. This could reflect local peritoneal metastatic disease or sub cm neoplastic adenopathy. 4. No evidence of metastatic disease in the chest. There small pleural effusions with associated dependent atelectasis. 5. Small hypo attenuating liver lesions. These are nonspecific. They may all be due to cysts. Metastatic disease is possible. 6.  No other evidence of metastatic disease.   Electronically Signed   By: Lajean Manes M.D.   On: 04/01/2015 19:37   Mr Brain Wo Contrast  04/02/2015   CLINICAL DATA:  Confusion.  Weakness.  Gastric adenocarcinoma.  EXAM: MRI HEAD WITHOUT CONTRAST  TECHNIQUE: Multiplanar, multiecho pulse sequences of the brain and surrounding structures were obtained without intravenous contrast.  COMPARISON:  CT without contrast 04/01/2015.  FINDINGS: Moderate generalized atrophy and diffuse white matter changes are noted bilaterally. No acute infarct, hemorrhage, or mass is present. The ventricles are proportionate to the degree of atrophy.  Flow is present in the major intracranial arteries. The globes and orbits are intact. The paranasal sinuses are clear. Mastoid air cells are clear.  Skullbase is within normal limits. Midline structures are unremarkable.  IMPRESSION: 1. No acute intracranial abnormality. 2. No evidence for metastatic disease to the brain. 3. Moderate atrophy and diffuse white matter disease. This likely reflects the sequela of chronic microvascular ischemia.   Electronically Signed   By: San Morelle M.D.   On: 04/02/2015 17:18   Nm Pulmonary Perf And Vent  04/02/2015   CLINICAL DATA:  Shortness of Breath  EXAM: NUCLEAR MEDICINE VENTILATION - PERFUSION LUNG SCAN  Views: Anterior, posterior, LPO, RPO, LAO, RAO-ventilation and perfusion  Radionuclide: Technetium 99 m DTPA-ventilation; Technetium  29m macroaggregated albumin-perfusion  Dose:  40.0 mCi-ventilation; 6.0 mCi-perfusion  Route of administration: Inhalation -ventilation ; intravenous -perfusion  COMPARISON:  Chest radiograph April 02, 2015  FINDINGS: Ventilation: Radiotracer uptake is homogeneous and symmetric bilaterally. There is elevation of the left hemidiaphragm. No focal ventilation defects are identified.  Perfusion: There is elevation of the left hemidiaphragm. Radiotracer uptake is homogeneous and symmetric bilaterally. There are no  appreciable perfusion defects.  IMPRESSION: No appreciable ventilation or perfusion defects. This study constitutes a very low probability of pulmonary embolus.   Electronically Signed   By: Lowella Grip III M.D.   On: 04/02/2015 16:48   Dg Chest Port 1 View  04/02/2015   CLINICAL DATA:  Shortness of breath.  EXAM: PORTABLE CHEST - 1 VIEW  COMPARISON:  CT 04/01/2015.  Chest x-ray 03/27/2015.  FINDINGS: Mediastinum hilar structures normal. Lungs are clear. Elevation of the left hemidiaphragm again noted. Mild subsegmental atelectasis left lung base. Small left pleural effusion cannot be excluded. Interposition of the colon under the left hemidiaphragm again noted. No free air . No acute bony abnormality.  IMPRESSION: 1. Stable elevation hemidiaphragm . 2. Mild left base subsegmental atelectasis. Small left pleural effusion cannot be excluded .   Electronically Signed   By: Marcello Moores  Register   On: 04/02/2015 11:32   Dg Chest Port 1 View  03/27/2015   CLINICAL DATA:  Weakness, fatigue, shortness of breath for 2 days. Ex-smoker  EXAM: PORTABLE CHEST - 1 VIEW  COMPARISON:  None.  FINDINGS: There is marked elevation of the left hemidiaphragm, of uncertain acuity. Lungs appear to be at least mildly hyperexpanded suggesting some degree of COPD. Lungs appear clear. No evidence of pneumonia. No pleural effusion. No pneumothorax.  Osseous structures are unremarkable. Cardiomediastinal silhouette appears normal in size and configuration, although a portion of the left heart border is obscured by the left hemidiaphragm elevation.  IMPRESSION: Lungs appear hyperexpanded suggesting some degree of underlying COPD.  Lungs are clear.  No evidence of acute cardiopulmonary abnormality.  Marked elevation of the left hemidiaphragm, of uncertain acuity but probably chronic.   Electronically Signed   By: Franki Cabot M.D.   On: 03/27/2015 14:55    Labs:  CBC:  Recent Labs  04/03/15 0311 04/10/15 1324 04/17/15 0801  04/21/15 1324  WBC 7.8 10.9* 7.2 9.1  HGB 9.2* 10.3* 11.7* 11.4*  HCT 28.3* 31.8* 35.6* 34.5*  PLT 286 424* 417* 314    COAGS:  Recent Labs  03/27/15 1442 03/28/15 0427  INR 1.02 1.08  APTT  --  26    BMP:  Recent Labs  03/31/15 0332 04/01/15 0432 04/02/15 0336 04/03/15 0311 04/10/15 1324 04/17/15 0801  NA 140 140 139 138 138 136  K 4.2 4.2 4.4 4.8 4.0 3.8  CL 120* 116* 113* 111  --   --   CO2 17* 19* 21* 22 22 24   GLUCOSE 114* 94 95 106* 82 101  BUN 20 20 20 16  16.9 18.9  CALCIUM 7.5* 7.9* 7.9* 8.1* 7.9* 8.2*  CREATININE 1.28* 1.34* 1.28* 1.34* 1.4* 1.1  GFRNONAA 51* 48* 51* 48*  --   --   GFRAA 59* 56* 59* 56*  --   --     LIVER FUNCTION TESTS:  Recent Labs  03/27/15 1442 04/03/15 0311 04/10/15 1324 04/17/15 0801  BILITOT 0.5 0.3 0.26 0.24  AST 19 30 23 28   ALT 10* 27 26 28   ALKPHOS 52 41 65 58  PROT 5.7* 4.0* 5.0* 5.1*  ALBUMIN 3.0* 1.9* 2.1* 2.1*    TUMOR MARKERS:  Recent Labs  04/01/15 1528  CEA 12.2*    Assessment and Plan:  Gastric adenocarcinoma Scheduled for PAC placement for ongoing chemo Risks and Benefits discussed with the patient including, but not limited to bleeding, infection, pneumothorax, or fibrin sheath development and need for additional procedures. All of the patient's questions were answered, patient is agreeable to proceed. Consent signed and in chart.   Thank you for this interesting consult.  I greatly enjoyed meeting Benjamin Lawson and look forward to participating in their care.  A copy of this report was sent to the requesting provider on this date.  Signed: Alexandros Ewan A 04/23/2015, 1:26 PM    I spent a total of  30 Minutes   in face to face in clinical consultation, greater than 50% of which was counseling/coordinating care for

## 2015-04-23 NOTE — Procedures (Signed)
Successful placement of a right brachial vein approach mciropuncture sheath for temporary venous access for medication administration during port placement. Successful placement of right IJ approach port-a-cath with tip at the superior caval atrial junction. The catheter is ready for immediate use. No immediate post procedural complications.  Ronny Bacon, MD Pager #: [phone]

## 2015-04-24 ENCOUNTER — Ambulatory Visit
Admission: RE | Admit: 2015-04-24 | Discharge: 2015-04-24 | Disposition: A | Discharge status: Home / Self Care | Payer: Medicare Other | Source: Ambulatory Visit | Attending: Radiation Oncology | Admitting: Radiation Oncology

## 2015-04-24 ENCOUNTER — Ambulatory Visit: Payer: Medicare Other

## 2015-04-24 ENCOUNTER — Other Ambulatory Visit: Payer: Self-pay | Admitting: *Deleted

## 2015-04-24 ENCOUNTER — Ambulatory Visit (HOSPITAL_BASED_OUTPATIENT_CLINIC_OR_DEPARTMENT_OTHER): Payer: Medicare Other

## 2015-04-24 ENCOUNTER — Other Ambulatory Visit (HOSPITAL_BASED_OUTPATIENT_CLINIC_OR_DEPARTMENT_OTHER): Payer: Medicare Other

## 2015-04-24 VITALS — BP 102/64 | HR 111 | Temp 98.0°F | Resp 20

## 2015-04-24 DIAGNOSIS — C169 Malignant neoplasm of stomach, unspecified: Secondary | ICD-10-CM

## 2015-04-24 DIAGNOSIS — Z5111 Encounter for antineoplastic chemotherapy: Secondary | ICD-10-CM | POA: Diagnosis present

## 2015-04-24 DIAGNOSIS — C160 Malignant neoplasm of cardia: Secondary | ICD-10-CM | POA: Diagnosis present

## 2015-04-24 DIAGNOSIS — Z95828 Presence of other vascular implants and grafts: Secondary | ICD-10-CM

## 2015-04-24 DIAGNOSIS — Z510 Encounter for antineoplastic radiation therapy: Secondary | ICD-10-CM | POA: Diagnosis not present

## 2015-04-24 LAB — CBC WITH DIFFERENTIAL/PLATELET
BASO%: 0.2 % (ref 0.0–2.0)
Basophils Absolute: 0 10*3/uL (ref 0.0–0.1)
EOS%: 0.1 % (ref 0.0–7.0)
Eosinophils Absolute: 0 10*3/uL (ref 0.0–0.5)
HCT: 30.2 % — ABNORMAL LOW (ref 38.4–49.9)
HGB: 10.1 g/dL — ABNORMAL LOW (ref 13.0–17.1)
LYMPH%: 3.1 % — AB (ref 14.0–49.0)
MCH: 29.1 pg (ref 27.2–33.4)
MCHC: 33.4 g/dL (ref 32.0–36.0)
MCV: 87 fL (ref 79.3–98.0)
MONO#: 0.2 10*3/uL (ref 0.1–0.9)
MONO%: 5.7 % (ref 0.0–14.0)
NEUT#: 3.7 10*3/uL (ref 1.5–6.5)
NEUT%: 90.9 % — AB (ref 39.0–75.0)
PLATELETS: 253 10*3/uL (ref 140–400)
RBC: 3.47 10*6/uL — AB (ref 4.20–5.82)
RDW: 14.7 % — ABNORMAL HIGH (ref 11.0–14.6)
WBC: 4.1 10*3/uL (ref 4.0–10.3)
lymph#: 0.1 10*3/uL — ABNORMAL LOW (ref 0.9–3.3)

## 2015-04-24 MED ORDER — SODIUM CHLORIDE 0.9 % IV SOLN
Freq: Once | INTRAVENOUS | Status: AC
  Administered 2015-04-24: 13:00:00 via INTRAVENOUS
  Filled 2015-04-24: qty 8

## 2015-04-24 MED ORDER — FAMOTIDINE IN NACL 20-0.9 MG/50ML-% IV SOLN
20.0000 mg | Freq: Once | INTRAVENOUS | Status: AC
  Administered 2015-04-24: 20 mg via INTRAVENOUS

## 2015-04-24 MED ORDER — HEPARIN SOD (PORK) LOCK FLUSH 100 UNIT/ML IV SOLN
500.0000 [IU] | Freq: Once | INTRAVENOUS | Status: AC | PRN
  Administered 2015-04-24: 500 [IU]
  Filled 2015-04-24: qty 5

## 2015-04-24 MED ORDER — DIPHENHYDRAMINE HCL 50 MG/ML IJ SOLN
25.0000 mg | Freq: Once | INTRAMUSCULAR | Status: AC
  Administered 2015-04-24: 25 mg via INTRAVENOUS

## 2015-04-24 MED ORDER — SODIUM CHLORIDE 0.9 % IV SOLN
Freq: Once | INTRAVENOUS | Status: AC
  Administered 2015-04-24: 12:00:00 via INTRAVENOUS

## 2015-04-24 MED ORDER — SODIUM CHLORIDE 0.9 % IJ SOLN
10.0000 mL | INTRAMUSCULAR | Status: DC | PRN
  Administered 2015-04-24: 10 mL via INTRAVENOUS
  Filled 2015-04-24: qty 10

## 2015-04-24 MED ORDER — FAMOTIDINE IN NACL 20-0.9 MG/50ML-% IV SOLN
INTRAVENOUS | Status: AC
  Filled 2015-04-24: qty 50

## 2015-04-24 MED ORDER — SODIUM CHLORIDE 0.9 % IV SOLN
170.0000 mg | Freq: Once | INTRAVENOUS | Status: AC
  Administered 2015-04-24: 170 mg via INTRAVENOUS
  Filled 2015-04-24: qty 17

## 2015-04-24 MED ORDER — DIPHENHYDRAMINE HCL 50 MG/ML IJ SOLN
INTRAMUSCULAR | Status: AC
  Filled 2015-04-24: qty 1

## 2015-04-24 MED ORDER — PACLITAXEL CHEMO INJECTION 300 MG/50ML
50.0000 mg/m2 | Freq: Once | INTRAVENOUS | Status: AC
  Administered 2015-04-24: 102 mg via INTRAVENOUS
  Filled 2015-04-24: qty 17

## 2015-04-24 MED ORDER — SODIUM CHLORIDE 0.9 % IJ SOLN
10.0000 mL | INTRAMUSCULAR | Status: DC | PRN
  Administered 2015-04-24: 10 mL
  Filled 2015-04-24: qty 10

## 2015-04-24 NOTE — Patient Instructions (Signed)

## 2015-04-24 NOTE — Patient Instructions (Signed)
[facility] Discharge Instructions for Patients Receiving Chemotherapy  Today you received the following chemotherapy agents:  Taxol and Carboplatin  To help prevent nausea and vomiting after your treatment, we encourage you to take your nausea medication as ordered per MD.   If you develop nausea and vomiting that is not controlled by your nausea medication, call the clinic.   BELOW ARE SYMPTOMS THAT SHOULD BE REPORTED IMMEDIATELY:  *FEVER GREATER THAN 100.5 F  *CHILLS WITH OR WITHOUT FEVER  NAUSEA AND VOMITING THAT IS NOT CONTROLLED WITH YOUR NAUSEA MEDICATION  *UNUSUAL SHORTNESS OF BREATH  *UNUSUAL BRUISING OR BLEEDING  TENDERNESS IN MOUTH AND THROAT WITH OR WITHOUT PRESENCE OF ULCERS  *URINARY PROBLEMS  *BOWEL PROBLEMS  UNUSUAL RASH Items with * indicate a potential emergency and should be followed up as soon as possible.  Feel free to call the clinic you have any questions or concerns. The clinic phone number is [phone].  Please show the CHEMO ALERT CARD at check-in to the Emergency Department and triage nurse.   

## 2015-04-24 NOTE — Progress Notes (Signed)
OK to treat with CMET from 04/17/15 per Dr. Benay Spice.

## 2015-04-24 NOTE — Progress Notes (Signed)
Pt port placed yesterday, requesting rx for emla cream. Called and informed Amy, RN with Dr. Benay Spice, MD

## 2015-04-25 ENCOUNTER — Ambulatory Visit
Admission: RE | Admit: 2015-04-25 | Discharge: 2015-04-25 | Disposition: A | Discharge status: Home / Self Care | Payer: Medicare Other | Source: Ambulatory Visit | Attending: Radiation Oncology | Admitting: Radiation Oncology

## 2015-04-25 ENCOUNTER — Encounter: Payer: Self-pay | Admitting: Radiation Oncology

## 2015-04-25 VITALS — BP 101/79 | HR 100 | Temp 98.6°F | Resp 20 | Wt 176.7 lb

## 2015-04-25 DIAGNOSIS — Z510 Encounter for antineoplastic radiation therapy: Secondary | ICD-10-CM | POA: Diagnosis not present

## 2015-04-25 DIAGNOSIS — C169 Malignant neoplasm of stomach, unspecified: Secondary | ICD-10-CM

## 2015-04-25 NOTE — Progress Notes (Signed)
   Department of Radiation Oncology  Phone:  [phone] Fax:        [phone]  Weekly Treatment Note    Name: Benjamin Lawson Date: 04/25/2015 MRN: [id number] DOB: 20-Dec-1932   Current dose: 21.6 Gy  Current fraction: 12   MEDICATIONS: Current Outpatient Prescriptions  Medication Sig Dispense Refill  . atorvastatin (LIPITOR) 20 MG tablet Take 20 mg by mouth daily.    Marland Kitchen ENSURE PLUS (ENSURE PLUS) LIQD Take 237 mLs by mouth 2 (two) times daily between meals.    . metoprolol succinate (TOPROL-XL) 50 MG 24 hr tablet Take 50 mg by mouth daily. Take with or immediately following a meal.    . MICARDIS HCT 80-12.5 MG per tablet Take 1 capsule by mouth daily.    . pantoprazole (PROTONIX) 40 MG tablet Take 1 tablet (40 mg total) by mouth 2 (two) times daily before a meal. 60 tablet 2  . tolterodine (DETROL LA) 4 MG 24 hr capsule Take 4 mg by mouth daily.    . sennosides-docusate sodium (SENOKOT-S) 8.6-50 MG tablet Take 1 tablet by mouth daily.     No current facility-administered medications for this encounter.     ALLERGIES: Review of patient's allergies indicates no known allergies.   LABORATORY DATA:  Lab Results  Component Value Date   WBC 4.1 04/24/2015   HGB 10.1* 04/24/2015   HCT 30.2* 04/24/2015   MCV 87.0 04/24/2015   PLT 253 04/24/2015   Lab Results  Component Value Date   NA 136 04/17/2015   K 3.8 04/17/2015   CL 111 04/03/2015   CO2 24 04/17/2015   Lab Results  Component Value Date   ALT 28 04/17/2015   AST 28 04/17/2015   ALKPHOS 58 04/17/2015   BILITOT 0.24 04/17/2015     NARRATIVE: Benjamin Lawson was seen today for weekly treatment management. The chart was checked and the patient's films were reviewed.  Weekly rad txs abdomen, 12/25 completed, swollen extremities upper and lower, nuasea, appetite poor to fair, no taste,,  Drank 4 cans ensure so far today 2 with each meals, fatigued,weak,will bring in correct med list Monday 4:56 PM BP  101/79 mmHg  Pulse 100  Temp(Src) 98.6 F (37 C) (Oral)  Resp 20  Wt 176 lb 11.2 oz (80.151 kg)  Wt Readings from Last 3 Encounters:  04/25/15 176 lb 11.2 oz (80.151 kg)  04/21/15 174 lb 12.8 oz (79.289 kg)  04/18/15 178 lb 9.6 oz (81.012 kg)    PHYSICAL EXAMINATION: weight is 176 lb 11.2 oz (80.151 kg). His oral temperature is 98.6 F (37 C). His blood pressure is 101/79 and his pulse is 100. His respiration is 20.        ASSESSMENT: The patient is doing satisfactorily with treatment.  PLAN: We will continue with the patient's radiation treatment as planned.

## 2015-04-25 NOTE — Progress Notes (Signed)
Weekly rad txs abdomen, 12/25 completed, swollen extremities upper and lower, nuasea, appetite poor to fair, no taste,,  Drank 4 cans ensure so far today 2 with each meals, fatigued,weak,will bring in correct med list Monday 3:00 PM BP 101/79 mmHg  Pulse 100  Temp(Src) 98.6 F (37 C) (Oral)  Resp 20  Wt 176 lb 11.2 oz (80.151 kg)  Wt Readings from Last 3 Encounters:  04/25/15 176 lb 11.2 oz (80.151 kg)  04/21/15 174 lb 12.8 oz (79.289 kg)  04/18/15 178 lb 9.6 oz (81.012 kg)

## 2015-04-27 ENCOUNTER — Other Ambulatory Visit: Payer: Self-pay | Admitting: Oncology

## 2015-04-28 ENCOUNTER — Ambulatory Visit
Admit: 2015-04-28 | Discharge: 2015-04-28 | Disposition: A | Discharge status: Home / Self Care | Payer: Medicare Other | Attending: Radiation Oncology | Admitting: Radiation Oncology

## 2015-04-28 DIAGNOSIS — Z510 Encounter for antineoplastic radiation therapy: Secondary | ICD-10-CM | POA: Diagnosis not present

## 2015-04-29 ENCOUNTER — Ambulatory Visit
Admit: 2015-04-29 | Discharge: 2015-04-29 | Disposition: A | Discharge status: Home / Self Care | Payer: Medicare Other | Attending: Radiation Oncology | Admitting: Radiation Oncology

## 2015-04-29 DIAGNOSIS — Z510 Encounter for antineoplastic radiation therapy: Secondary | ICD-10-CM | POA: Diagnosis not present

## 2015-04-29 NOTE — Progress Notes (Signed)
Patient brought to nursing before treatment, patine had diarrhea in depends, cleaned patient with warm water moist towels and replaced depends, and taken to [facility] for treatment,

## 2015-04-30 ENCOUNTER — Ambulatory Visit
Admit: 2015-04-30 | Discharge: 2015-04-30 | Disposition: A | Discharge status: Home / Self Care | Payer: Medicare Other | Attending: Radiation Oncology | Admitting: Radiation Oncology

## 2015-04-30 ENCOUNTER — Telehealth: Payer: Self-pay | Admitting: *Deleted

## 2015-04-30 DIAGNOSIS — Z510 Encounter for antineoplastic radiation therapy: Secondary | ICD-10-CM | POA: Diagnosis not present

## 2015-04-30 DIAGNOSIS — C169 Malignant neoplasm of stomach, unspecified: Secondary | ICD-10-CM

## 2015-04-30 MED ORDER — LIDOCAINE-PRILOCAINE (BULK) 2.5-2.5 % CREA: 5.0000 mL | TOPICAL_CREAM | Status: DC | PRN

## 2015-04-30 NOTE — Telephone Encounter (Signed)
Voicemail from spouse reporting to "send Emla Cream order to [facility] on [address]."

## 2015-05-01 ENCOUNTER — Other Ambulatory Visit (HOSPITAL_BASED_OUTPATIENT_CLINIC_OR_DEPARTMENT_OTHER): Payer: Medicare Other

## 2015-05-01 ENCOUNTER — Telehealth: Payer: Self-pay | Admitting: Nurse Practitioner

## 2015-05-01 ENCOUNTER — Ambulatory Visit
Admit: 2015-05-01 | Discharge: 2015-05-01 | Disposition: A | Discharge status: Home / Self Care | Payer: Medicare Other | Attending: Radiation Oncology | Admitting: Radiation Oncology

## 2015-05-01 ENCOUNTER — Other Ambulatory Visit: Payer: Medicare Other

## 2015-05-01 ENCOUNTER — Ambulatory Visit (HOSPITAL_BASED_OUTPATIENT_CLINIC_OR_DEPARTMENT_OTHER): Payer: Medicare Other

## 2015-05-01 ENCOUNTER — Ambulatory Visit: Payer: Medicare Other

## 2015-05-01 VITALS — BP 114/63 | HR 93 | Temp 97.7°F | Resp 20

## 2015-05-01 DIAGNOSIS — C160 Malignant neoplasm of cardia: Secondary | ICD-10-CM | POA: Diagnosis present

## 2015-05-01 DIAGNOSIS — Z95828 Presence of other vascular implants and grafts: Secondary | ICD-10-CM

## 2015-05-01 DIAGNOSIS — Z510 Encounter for antineoplastic radiation therapy: Secondary | ICD-10-CM | POA: Diagnosis not present

## 2015-05-01 DIAGNOSIS — C169 Malignant neoplasm of stomach, unspecified: Secondary | ICD-10-CM

## 2015-05-01 LAB — CBC WITH DIFFERENTIAL/PLATELET
BASO%: 0.3 % (ref 0.0–2.0)
BASOS ABS: 0 10*3/uL (ref 0.0–0.1)
EOS%: 0.3 % (ref 0.0–7.0)
Eosinophils Absolute: 0 10*3/uL (ref 0.0–0.5)
HEMATOCRIT: 28.7 % — AB (ref 38.4–49.9)
HEMOGLOBIN: 9.4 g/dL — AB (ref 13.0–17.1)
LYMPH#: 0.1 10*3/uL — AB (ref 0.9–3.3)
LYMPH%: 9 % — ABNORMAL LOW (ref 14.0–49.0)
MCH: 28.3 pg (ref 27.2–33.4)
MCHC: 32.7 g/dL (ref 32.0–36.0)
MCV: 86.4 fL (ref 79.3–98.0)
MONO#: 0.1 10*3/uL (ref 0.1–0.9)
MONO%: 12.1 % (ref 0.0–14.0)
NEUT#: 0.7 10*3/uL — ABNORMAL LOW (ref 1.5–6.5)
NEUT%: 78.3 % — AB (ref 39.0–75.0)
PLATELETS: 121 10*3/uL — AB (ref 140–400)
RBC: 3.33 10*6/uL — ABNORMAL LOW (ref 4.20–5.82)
RDW: 14 % (ref 11.0–14.6)
WBC: 0.9 10*3/uL — CL (ref 4.0–10.3)

## 2015-05-01 LAB — COMPREHENSIVE METABOLIC PANEL (CC13)
ALBUMIN: 2 g/dL — AB (ref 3.5–5.0)
ALK PHOS: 62 U/L (ref 40–150)
ALT: 14 U/L (ref 0–55)
ANION GAP: 10 meq/L (ref 3–11)
AST: 19 U/L (ref 5–34)
BILIRUBIN TOTAL: 0.26 mg/dL (ref 0.20–1.20)
BUN: 20.4 mg/dL (ref 7.0–26.0)
CALCIUM: 7.3 mg/dL — AB (ref 8.4–10.4)
CO2: 23 mEq/L (ref 22–29)
CREATININE: 1.3 mg/dL (ref 0.7–1.3)
Chloride: 102 mEq/L (ref 98–109)
EGFR: 57 mL/min/{1.73_m2} — ABNORMAL LOW (ref >90)
Glucose: 163 mg/dl — ABNORMAL HIGH (ref 70–140)
Potassium: 2.4 mEq/L — CL (ref 3.5–5.1)
Sodium: 135 mEq/L — ABNORMAL LOW (ref 136–145)
Total Protein: 5 g/dL — ABNORMAL LOW (ref 6.4–8.3)

## 2015-05-01 MED ORDER — HEPARIN SOD (PORK) LOCK FLUSH 100 UNIT/ML IV SOLN
500.0000 [IU] | Freq: Once | INTRAVENOUS | Status: AC | PRN
  Administered 2015-05-01: 500 [IU]
  Filled 2015-05-01: qty 5

## 2015-05-01 MED ORDER — SODIUM CHLORIDE 0.9 % IJ SOLN
10.0000 mL | INTRAMUSCULAR | Status: DC | PRN
  Administered 2015-05-01: 10 mL via INTRAVENOUS
  Filled 2015-05-01: qty 10

## 2015-05-01 MED ORDER — SODIUM CHLORIDE 0.9 % IJ SOLN
10.0000 mL | INTRAMUSCULAR | Status: DC | PRN
  Administered 2015-05-01: 10 mL
  Filled 2015-05-01: qty 10

## 2015-05-01 NOTE — Patient Instructions (Signed)
Neutropenia Neutropenia is a condition that occurs when the level of a certain type of white blood cell (neutrophil) in your body becomes lower than normal. Neutrophils are made in the bone marrow and fight infections. These cells protect against bacteria and viruses. The fewer neutrophils you have, and the longer your body remains without them, the greater your risk of getting a severe infection becomes. CAUSES  The cause of neutropenia may be hard to determine. However, it is usually due to 3 main problems:   Decreased production of neutrophils. This may be due to:  Certain medicines such as chemotherapy.  Genetic problems.  Cancer.  Radiation treatments.  Vitamin deficiency.  Some pesticides.  Increased destruction of neutrophils. This may be due to:  Overwhelming infections.  Hemolytic anemia. This is when the body destroys its own blood cells.  Chemotherapy.  Neutrophils moving to areas of the body where they cannot fight infections. This may be due to:  Dialysis procedures.  Conditions where the spleen becomes enlarged. Neutrophils are held in the spleen and are not available to the rest of the body.  Overwhelming infections. The neutrophils are held in the area of the infection and are not available to the rest of the body. SYMPTOMS  There are no specific symptoms of neutropenia. The lack of neutrophils can result in an infection, and an infection can cause various problems. DIAGNOSIS  Diagnosis is made by a blood test. A complete blood count is performed. The normal level of neutrophils in human blood differs with age and race. Infants have lower counts than older children and adults. African Americans have lower counts than Caucasians or Asians. The average adult level is 1500 cells/mm3 of blood. Neutrophil counts are interpreted as follows:  Greater than 1000 cells/mm3 gives normal protection against infection.  500 to 1000 cells/mm3 gives an increased risk for  infection.  200 to 500 cells/mm3 is a greater risk for severe infection.  Lower than 200 cells/mm3 is a marked risk of infection. This may require hospitalization and treatment with antibiotic medicines. TREATMENT  Treatment depends on the underlying cause, severity, and presence of infections or symptoms. It also depends on your health. Your caregiver will discuss the treatment plan with you. Mild cases are often easily treated and have a good outcome. Preventative measures may also be started to limit your risk of infections. Treatment can include:  Taking antibiotics.  Stopping medicines that are known to cause neutropenia.  Correcting nutritional deficiencies by eating green vegetables to supply folic acid and taking vitamin B supplements.  Stopping exposure to pesticides if your neutropenia is related to pesticide exposure.  Taking a blood growth factor called sargramostim, pegfilgrastim, or filgrastim if you are undergoing chemotherapy for cancer. This stimulates white blood cell production.  Removal of the spleen if you have Felty's syndrome and have repeated infections. HOME CARE INSTRUCTIONS   Follow your caregiver's instructions about when you need to have blood work done.  Wash your hands often. Make sure others who come in contact with you also wash their hands.  Wash raw fruits and vegetables before eating them. They can carry bacteria and fungi.  Avoid people with colds or spreadable (contagious) diseases (chickenpox, herpes zoster, influenza).  Avoid large crowds.  Avoid construction areas. The dust can release fungus into the air.  Be cautious around children in daycare or school environments.  Take care of your respiratory system by coughing and deep breathing.  Bathe daily.  Protect your skin from cuts and   burns.  Do not work in the garden or with flowers and plants.  Care for the mouth before and after meals by brushing with a soft toothbrush. If you have  mucositis, do not use mouthwash. Mouthwash contains alcohol and can dry out the mouth even more.  Clean the area between the genitals and the anus (perineal area) after urination and bowel movements. Women need to wipe from front to back.  Use a water soluble lubricant during sexual intercourse and practice good hygiene after. Do not have intercourse if you are severely neutropenic. Check with your caregiver for guidelines.  Exercise daily as tolerated.  Avoid people who were vaccinated with a live vaccine in the past 30 days. You should not receive live vaccines (polio, typhoid).  Do not provide direct care for pets. Avoid animal droppings. Do not clean litter boxes and bird cages.  Do not share food utensils.  Do not use tampons, enemas, or rectal suppositories unless directed by your caregiver.  Use an electric razor to remove hair.  Wash your hands after handling magazines, letters, and newspapers. SEEK IMMEDIATE MEDICAL CARE IF:   You have a fever.  You have chills or start to shake.  You feel nauseous or vomit.  You develop mouth sores.  You develop aches and pains.  You have redness and swelling around open wounds.  Your skin is warm to the touch.  You have pus coming from your wounds.  You develop swollen lymph nodes.  You feel weak or fatigued.  You develop red streaks on the skin. MAKE SURE YOU:  Understand these instructions.  Will watch your condition.  Will get help right away if you are not doing well or get worse. Document Released: 02/05/2002 Document Revised: 11/08/2011 Document Reviewed: 03/05/2011 Exit[employer] Patient Information 2015 [employer]. This information is not intended to replace advice given to you by your health care provider. Make sure you discuss any questions you have with your health care provider.  

## 2015-05-01 NOTE — Telephone Encounter (Signed)
[place] with Marzetta Board Mr. Andersson Nurse to give her a verbal order to give Mr Kabir Brannock 40 meq now and 20 meq in the morning. Nurse verbalized understanding

## 2015-05-01 NOTE — Progress Notes (Signed)
Hold treatment today due to labs.

## 2015-05-01 NOTE — Telephone Encounter (Signed)
-----   Message from Owens Shark, NP sent at 05/01/2015  2:01 PM EDT ----- Please find out if he is having diarrhea. Is he taking potassium?

## 2015-05-01 NOTE — Patient Instructions (Signed)

## 2015-05-01 NOTE — Telephone Encounter (Signed)
Called Benjamin Lawson at home no answer. Called his cell phone left a message for him to return the call to the cancer center.

## 2015-05-01 NOTE — Progress Notes (Signed)
CBC reviewed by Dr. Benay Spice: Order received to HOLD chemo today. Return as scheduled in one week. Call office for fever, shaking chills or other sign of infection. Arrie Aran, Chemo RN notified. She will inform pt.

## 2015-05-02 ENCOUNTER — Ambulatory Visit (HOSPITAL_BASED_OUTPATIENT_CLINIC_OR_DEPARTMENT_OTHER): Payer: Medicare Other

## 2015-05-02 ENCOUNTER — Ambulatory Visit
Admission: RE | Admit: 2015-05-02 | Discharge: 2015-05-02 | Disposition: A | Discharge status: Home / Self Care | Payer: Medicare Other | Source: Ambulatory Visit | Attending: Radiation Oncology | Admitting: Radiation Oncology

## 2015-05-02 ENCOUNTER — Encounter: Payer: Self-pay | Admitting: Radiation Oncology

## 2015-05-02 ENCOUNTER — Other Ambulatory Visit: Payer: Self-pay | Admitting: Nurse Practitioner

## 2015-05-02 ENCOUNTER — Telehealth: Payer: Self-pay | Admitting: Nurse Practitioner

## 2015-05-02 VITALS — BP 103/62 | HR 107 | Temp 97.8°F | Resp 20 | Wt 168.4 lb

## 2015-05-02 DIAGNOSIS — C160 Malignant neoplasm of cardia: Secondary | ICD-10-CM

## 2015-05-02 DIAGNOSIS — C169 Malignant neoplasm of stomach, unspecified: Secondary | ICD-10-CM

## 2015-05-02 DIAGNOSIS — Z510 Encounter for antineoplastic radiation therapy: Secondary | ICD-10-CM | POA: Diagnosis not present

## 2015-05-02 LAB — BASIC METABOLIC PANEL (CC13)
Anion Gap: 16 mEq/L — ABNORMAL HIGH (ref 3–11)
BUN: 17.9 mg/dL (ref 7.0–26.0)
CHLORIDE: 103 meq/L (ref 98–109)
CO2: 20 meq/L — AB (ref 22–29)
CREATININE: 1.2 mg/dL (ref 0.7–1.3)
Calcium: 7.8 mg/dL — ABNORMAL LOW (ref 8.4–10.4)
EGFR: 64 mL/min/{1.73_m2} — ABNORMAL LOW (ref >90)
GLUCOSE: 97 mg/dL (ref 70–140)
Potassium: 3.1 mEq/L — ABNORMAL LOW (ref 3.5–5.1)
Sodium: 139 mEq/L (ref 136–145)

## 2015-05-02 NOTE — Telephone Encounter (Signed)
Call placed to [facility] as Per Benjamin Card NP to give verbal orders to Nurse taking care of Benjamin Lawson. Nurse assigned to him today was Benjamin Lawson) gave verbal orders to give K Dur 20 meq this evening. 20 meq BID tomorrow 05/03/15 and 20 meq once  daily there after till 05/08/15. Nurse verbalized understanding of orders and relayed orders back verbatim.

## 2015-05-02 NOTE — Progress Notes (Signed)
weekly rad txs abdomen, 17/25 completed, slight nausea, gagging stated ac ouple times",  Having diarrhea, also patient stated"they gave me potassium pills last night and this am", asked for his med list from [facility], wasn't sent will call and ask them to fax to clarify medications, pateuint fatigued, drank ensure plus 1.5 cans today denies pain ;loss 6 lbs n 1 week 3:01 PM BP 103/62 mmHg  Pulse 107  Temp(Src) 97.8 F (36.6 C) (Oral)  Resp 20  Wt 168 lb 6.4 oz (76.386 kg)  SpO2 98%  Wt Readings from Last 3 Encounters:  05/02/15 168 lb 6.4 oz (76.386 kg)  04/25/15 176 lb 11.2 oz (80.151 kg)  04/21/15 174 lb 12.8 oz (79.289 kg)

## 2015-05-02 NOTE — Progress Notes (Signed)
Department of Radiation Oncology  Phone:  [phone] Fax:        [phone]  Weekly Treatment Note    Name: Benjamin Lawson Date: 05/05/2015 MRN: [id number] DOB: 1933-07-20   Current dose: 30.6 Gy  Current fraction: 17   MEDICATIONS: Current Outpatient Prescriptions  Medication Sig Dispense Refill  . atorvastatin (LIPITOR) 20 MG tablet Take 20 mg by mouth daily.    Marland Kitchen ENSURE PLUS (ENSURE PLUS) LIQD Take 237 mLs by mouth 2 (two) times daily between meals.    . lidocaine-prilocaine (EMLA) cream APPLY 5 ML TOPICALLY TO PORT-A-CATH 1 1/2 HOURS BEFORE CHEMOTHERAPY, AS NEEDED  0  . Lidocaine-Prilocaine, Bulk, 2.5-2.5 % CREA Apply 5 mLs topically as needed. To port-a-cath 1.5 hours before chemotherapy 30 g prn  . metoprolol succinate (TOPROL-XL) 50 MG 24 hr tablet Take 50 mg by mouth daily. Take with or immediately following a meal.    . MICARDIS HCT 80-12.5 MG per tablet Take 1 capsule by mouth daily.    . pantoprazole (PROTONIX) 40 MG tablet Take 1 tablet (40 mg total) by mouth 2 (two) times daily before a meal. 60 tablet 2  . sennosides-docusate sodium (SENOKOT-S) 8.6-50 MG tablet Take 1 tablet by mouth daily.    Marland Kitchen tolterodine (DETROL LA) 4 MG 24 hr capsule Take 4 mg by mouth daily.     No current facility-administered medications for this encounter.     ALLERGIES: Review of patient's allergies indicates no known allergies.   LABORATORY DATA:  Lab Results  Component Value Date   WBC 0.9* 05/01/2015   HGB 9.4* 05/01/2015   HCT 28.7* 05/01/2015   MCV 86.4 05/01/2015   PLT 121* 05/01/2015   Lab Results  Component Value Date   NA 139 05/02/2015   K 3.1* 05/02/2015   CL 111 04/03/2015   CO2 20* 05/02/2015   Lab Results  Component Value Date   ALT 14 05/01/2015   AST 19 05/01/2015   ALKPHOS 62 05/01/2015   BILITOT 0.26 05/01/2015     NARRATIVE: Benjamin Lawson was seen today for weekly treatment management. The chart was checked and the patient's films were  reviewed.  Weekly rad txs to the abdomen, 17/25 completed. Slight nausea, gagging stated "a couple of times." Having diarrhea. Pt also stated, "they gave me potassium pills last night and this morning."  Pt is fatigued and drinks Ensure Plus 1.5. He denies pain. 8 lb weight loss noted in the past week.  11:30 AM BP 103/62 mmHg  Pulse 107  Temp(Src) 97.8 F (36.6 C) (Oral)  Resp 20  Wt 168 lb 6.4 oz (76.386 kg)  SpO2 98%  Wt Readings from Last 3 Encounters:  05/02/15 168 lb 6.4 oz (76.386 kg)  04/25/15 176 lb 11.2 oz (80.151 kg)  04/21/15 174 lb 12.8 oz (79.289 kg)    PHYSICAL EXAMINATION: weight is 168 lb 6.4 oz (76.386 kg). His oral temperature is 97.8 F (36.6 C). His blood pressure is 103/62 and his pulse is 107. His respiration is 20 and oxygen saturation is 98%.    ASSESSMENT: The patient is doing satisfactorily with treatment.  PLAN: We will continue with the patient's radiation treatment as planned.   This document serves as a record of services personally performed by Kyung Rudd, MD. It was created on his behalf by Darcus Austin, a trained medical scribe. The creation of this record is based on the scribe's personal observations and the provider's statements to them. This document has  been checked and approved by the attending provider.

## 2015-05-06 ENCOUNTER — Telehealth: Payer: Self-pay | Admitting: *Deleted

## 2015-05-06 ENCOUNTER — Ambulatory Visit
Admit: 2015-05-06 | Discharge: 2015-05-06 | Disposition: A | Discharge status: Home / Self Care | Payer: Medicare Other | Attending: Radiation Oncology | Admitting: Radiation Oncology

## 2015-05-06 DIAGNOSIS — Z510 Encounter for antineoplastic radiation therapy: Secondary | ICD-10-CM | POA: Diagnosis not present

## 2015-05-06 NOTE — Telephone Encounter (Addendum)
LAB REGISTRATION,KIM CHANGED PT.'S 05/08/15 LAB APPOINTMENT TO THE FLUSH ROOM. NOTIFIED PT.'S DAUGHTER TO HAVE PT. COME 15 MINUTES EARLIER AT 11:15AM. SHE VOICES UNDERSTANDING.

## 2015-05-07 ENCOUNTER — Ambulatory Visit
Admission: RE | Admit: 2015-05-07 | Discharge: 2015-05-07 | Disposition: A | Discharge status: Home / Self Care | Payer: Medicare Other | Source: Ambulatory Visit | Attending: Radiation Oncology | Admitting: Radiation Oncology

## 2015-05-07 DIAGNOSIS — Z510 Encounter for antineoplastic radiation therapy: Secondary | ICD-10-CM | POA: Diagnosis not present

## 2015-05-08 ENCOUNTER — Ambulatory Visit (HOSPITAL_COMMUNITY)
Admission: RE | Admit: 2015-05-08 | Discharge: 2015-05-08 | Disposition: A | Discharge status: Home / Self Care | Payer: No Typology Code available for payment source | Source: Ambulatory Visit | Attending: Nurse Practitioner | Admitting: Nurse Practitioner

## 2015-05-08 ENCOUNTER — Ambulatory Visit
Admission: RE | Admit: 2015-05-08 | Discharge: 2015-05-08 | Disposition: A | Discharge status: Home / Self Care | Payer: Medicare Other | Source: Ambulatory Visit | Attending: Radiation Oncology | Admitting: Radiation Oncology

## 2015-05-08 ENCOUNTER — Other Ambulatory Visit: Payer: Self-pay | Admitting: Nurse Practitioner

## 2015-05-08 ENCOUNTER — Other Ambulatory Visit (HOSPITAL_BASED_OUTPATIENT_CLINIC_OR_DEPARTMENT_OTHER): Payer: Medicare Other

## 2015-05-08 ENCOUNTER — Ambulatory Visit (HOSPITAL_BASED_OUTPATIENT_CLINIC_OR_DEPARTMENT_OTHER): Payer: Medicare Other

## 2015-05-08 ENCOUNTER — Ambulatory Visit (HOSPITAL_BASED_OUTPATIENT_CLINIC_OR_DEPARTMENT_OTHER): Payer: Medicare Other | Admitting: Nurse Practitioner

## 2015-05-08 ENCOUNTER — Ambulatory Visit: Payer: Medicare Other

## 2015-05-08 ENCOUNTER — Ambulatory Visit: Payer: Medicare Other | Admitting: Nutrition

## 2015-05-08 VITALS — BP 108/70 | HR 107 | Temp 98.1°F | Resp 20

## 2015-05-08 DIAGNOSIS — Q791 Other congenital malformations of diaphragm: Secondary | ICD-10-CM | POA: Insufficient documentation

## 2015-05-08 DIAGNOSIS — R0602 Shortness of breath: Secondary | ICD-10-CM | POA: Diagnosis present

## 2015-05-08 DIAGNOSIS — C169 Malignant neoplasm of stomach, unspecified: Secondary | ICD-10-CM

## 2015-05-08 DIAGNOSIS — R110 Nausea: Secondary | ICD-10-CM

## 2015-05-08 DIAGNOSIS — C160 Malignant neoplasm of cardia: Secondary | ICD-10-CM

## 2015-05-08 DIAGNOSIS — K6389 Other specified diseases of intestine: Secondary | ICD-10-CM | POA: Diagnosis not present

## 2015-05-08 DIAGNOSIS — Z95828 Presence of other vascular implants and grafts: Secondary | ICD-10-CM

## 2015-05-08 DIAGNOSIS — E8809 Other disorders of plasma-protein metabolism, not elsewhere classified: Secondary | ICD-10-CM

## 2015-05-08 DIAGNOSIS — R0989 Other specified symptoms and signs involving the circulatory and respiratory systems: Secondary | ICD-10-CM | POA: Insufficient documentation

## 2015-05-08 DIAGNOSIS — E876 Hypokalemia: Secondary | ICD-10-CM

## 2015-05-08 DIAGNOSIS — E860 Dehydration: Secondary | ICD-10-CM | POA: Diagnosis not present

## 2015-05-08 DIAGNOSIS — R05 Cough: Secondary | ICD-10-CM | POA: Diagnosis not present

## 2015-05-08 DIAGNOSIS — Z510 Encounter for antineoplastic radiation therapy: Secondary | ICD-10-CM | POA: Diagnosis not present

## 2015-05-08 DIAGNOSIS — R600 Localized edema: Secondary | ICD-10-CM

## 2015-05-08 DIAGNOSIS — Z5111 Encounter for antineoplastic chemotherapy: Secondary | ICD-10-CM

## 2015-05-08 DIAGNOSIS — R197 Diarrhea, unspecified: Secondary | ICD-10-CM

## 2015-05-08 DIAGNOSIS — R609 Edema, unspecified: Secondary | ICD-10-CM

## 2015-05-08 LAB — CBC WITH DIFFERENTIAL/PLATELET
BASO%: 0.5 % (ref 0.0–2.0)
BASOS ABS: 0 10*3/uL (ref 0.0–0.1)
EOS ABS: 0 10*3/uL (ref 0.0–0.5)
EOS%: 0.2 % (ref 0.0–7.0)
HEMATOCRIT: 27.8 % — AB (ref 38.4–49.9)
HGB: 9.5 g/dL — ABNORMAL LOW (ref 13.0–17.1)
LYMPH#: 0.1 10*3/uL — AB (ref 0.9–3.3)
LYMPH%: 7.3 % — ABNORMAL LOW (ref 14.0–49.0)
MCH: 29.1 pg (ref 27.2–33.4)
MCHC: 34.1 g/dL (ref 32.0–36.0)
MCV: 85.4 fL (ref 79.3–98.0)
MONO#: 0.1 10*3/uL (ref 0.1–0.9)
MONO%: 8.3 % (ref 0.0–14.0)
NEUT#: 1.3 10*3/uL — ABNORMAL LOW (ref 1.5–6.5)
NEUT%: 83.7 % — AB (ref 39.0–75.0)
PLATELETS: 100 10*3/uL — AB (ref 140–400)
RBC: 3.25 10*6/uL — ABNORMAL LOW (ref 4.20–5.82)
RDW: 14.8 % — ABNORMAL HIGH (ref 11.0–14.6)
WBC: 1.6 10*3/uL — ABNORMAL LOW (ref 4.0–10.3)

## 2015-05-08 LAB — COMPREHENSIVE METABOLIC PANEL (CC13)
ALT: 16 U/L (ref 0–55)
ANION GAP: 10 meq/L (ref 3–11)
AST: 19 U/L (ref 5–34)
Albumin: 2.1 g/dL — ABNORMAL LOW (ref 3.5–5.0)
Alkaline Phosphatase: 71 U/L (ref 40–150)
BILIRUBIN TOTAL: 0.41 mg/dL (ref 0.20–1.20)
BUN: 14.2 mg/dL (ref 7.0–26.0)
CALCIUM: 7.7 mg/dL — AB (ref 8.4–10.4)
CHLORIDE: 107 meq/L (ref 98–109)
CO2: 23 mEq/L (ref 22–29)
CREATININE: 1.3 mg/dL (ref 0.7–1.3)
EGFR: 61 mL/min/{1.73_m2} — ABNORMAL LOW (ref >90)
Glucose: 103 mg/dl (ref 70–140)
Potassium: 3 mEq/L — CL (ref 3.5–5.1)
Sodium: 139 mEq/L (ref 136–145)
Total Protein: 5 g/dL — ABNORMAL LOW (ref 6.4–8.3)

## 2015-05-08 MED ORDER — FAMOTIDINE IN NACL 20-0.9 MG/50ML-% IV SOLN
20.0000 mg | Freq: Once | INTRAVENOUS | Status: AC
  Administered 2015-05-08: 20 mg via INTRAVENOUS

## 2015-05-08 MED ORDER — DIPHENHYDRAMINE HCL 50 MG/ML IJ SOLN
25.0000 mg | Freq: Once | INTRAMUSCULAR | Status: AC
  Administered 2015-05-08: 25 mg via INTRAVENOUS

## 2015-05-08 MED ORDER — SODIUM CHLORIDE 0.9 % IV SOLN
Freq: Once | INTRAVENOUS | Status: AC
  Administered 2015-05-08: 13:00:00 via INTRAVENOUS

## 2015-05-08 MED ORDER — PACLITAXEL CHEMO INJECTION 300 MG/50ML
37.5000 mg/m2 | Freq: Once | INTRAVENOUS | Status: AC
  Administered 2015-05-08: 78 mg via INTRAVENOUS
  Filled 2015-05-08: qty 13

## 2015-05-08 MED ORDER — DIPHENHYDRAMINE HCL 50 MG/ML IJ SOLN
INTRAMUSCULAR | Status: AC
Start: 2015-05-08 — End: 2015-05-08
  Filled 2015-05-08: qty 1

## 2015-05-08 MED ORDER — FAMOTIDINE IN NACL 20-0.9 MG/50ML-% IV SOLN
INTRAVENOUS | Status: AC
Start: 2015-05-08 — End: 2015-05-08
  Filled 2015-05-08: qty 50

## 2015-05-08 MED ORDER — HEPARIN SOD (PORK) LOCK FLUSH 100 UNIT/ML IV SOLN
500.0000 [IU] | Freq: Once | INTRAVENOUS | Status: AC | PRN
  Administered 2015-05-08: 500 [IU]
  Filled 2015-05-08: qty 5

## 2015-05-08 MED ORDER — SODIUM CHLORIDE 0.9 % IV SOLN
Freq: Once | INTRAVENOUS | Status: AC
  Administered 2015-05-08: 16:00:00 via INTRAVENOUS
  Filled 2015-05-08: qty 8

## 2015-05-08 MED ORDER — SODIUM CHLORIDE 0.9 % IV SOLN
Freq: Once | INTRAVENOUS | Status: AC
  Administered 2015-05-08: 15:00:00 via INTRAVENOUS

## 2015-05-08 MED ORDER — SODIUM CHLORIDE 0.9 % IJ SOLN
10.0000 mL | INTRAMUSCULAR | Status: DC | PRN
  Administered 2015-05-08: 10 mL
  Filled 2015-05-08: qty 10

## 2015-05-08 MED ORDER — SODIUM CHLORIDE 0.9 % IJ SOLN
10.0000 mL | INTRAMUSCULAR | Status: DC | PRN
  Administered 2015-05-08: 10 mL via INTRAVENOUS
  Filled 2015-05-08: qty 10

## 2015-05-08 MED ORDER — SODIUM CHLORIDE 0.9 % IV SOLN
114.3000 mg | Freq: Once | INTRAVENOUS | Status: AC
  Administered 2015-05-08: 110 mg via INTRAVENOUS
  Filled 2015-05-08: qty 11

## 2015-05-08 MED ORDER — POTASSIUM CHLORIDE CRYS ER 20 MEQ PO TBCR
40.0000 meq | EXTENDED_RELEASE_TABLET | Freq: Once | ORAL | Status: AC
  Administered 2015-05-08: 40 meq via ORAL
  Filled 2015-05-08: qty 2

## 2015-05-08 NOTE — Patient Instructions (Signed)
[facility] Discharge Instructions for Patients Receiving Chemotherapy  Today you received the following chemotherapy agents:  Taxol and Carboplatin  To help prevent nausea and vomiting after your treatment, we encourage you to take your nausea medication as ordered per MD.   If you develop nausea and vomiting that is not controlled by your nausea medication, call the clinic.   BELOW ARE SYMPTOMS THAT SHOULD BE REPORTED IMMEDIATELY:  *FEVER GREATER THAN 100.5 F  *CHILLS WITH OR WITHOUT FEVER  NAUSEA AND VOMITING THAT IS NOT CONTROLLED WITH YOUR NAUSEA MEDICATION  *UNUSUAL SHORTNESS OF BREATH  *UNUSUAL BRUISING OR BLEEDING  TENDERNESS IN MOUTH AND THROAT WITH OR WITHOUT PRESENCE OF ULCERS  *URINARY PROBLEMS  *BOWEL PROBLEMS  UNUSUAL RASH Items with * indicate a potential emergency and should be followed up as soon as possible.  Feel free to call the clinic you have any questions or concerns. The clinic phone number is [phone].  Please show the CHEMO ALERT CARD at check-in to the Emergency Department and triage nurse.   

## 2015-05-08 NOTE — Progress Notes (Signed)
Nutrition follow-up completed with patient and daughter in infusion.  Patient is being treated for gastric cancer. Patient was unable to provide specific nutrition information. Continues to live at a nursing home. He states he does not care for their food He states he drinks 2 oral nutrition supplements every meal for total of 6 daily. Daughter states this could be Ensure Plus, boost plus or ensure clear. Weight has decreased and was documented as 168.4 pounds September 2. Patient continues to complain of diarrhea but cannot give specifics.  Nutrition diagnosis: Unintended weight loss continues.  Intervention: Encouraged patient to increase consumption of high-calorie, high-protein foods at mealtimes. Encouraged family and friends to bring food to patient at nursing home. Recommended patient choose Ensure Plus or boost plus most often. Encouraged daughter to speak with providers at nursing home to determine meal preferences, supplement preferences and more information about loose stools. Questions were answered and teach back method used.  Monitoring, evaluation, goals: Patient will tolerate adequate calories and protein to minimize weight loss.  Next visit: Follow-up has not been scheduled.  Patient and family have my contact information for questions.  **Disclaimer: This note was dictated with voice recognition software. Similar sounding words can inadvertently be transcribed and this note may contain transcription errors which may not have been corrected upon publication of note.**

## 2015-05-08 NOTE — Progress Notes (Signed)
@   1500 pt returned from radiology (had CXR done). VS done and recorded. Selena Lesser, NP made aware of VS

## 2015-05-08 NOTE — Progress Notes (Signed)
1330-C. Berniece Salines NP to infusion room to assess pt.  Additional 500 ml NS given per order over 1 hour.  CXR ordered and done.  VS rechecked and called to C. Berniece Salines NP when IVF's complete.  Order received to proceed with chemo at reduced rate ordered by Dr. Benay Spice.

## 2015-05-08 NOTE — Patient Instructions (Signed)

## 2015-05-09 ENCOUNTER — Encounter: Payer: Self-pay | Admitting: Radiation Oncology

## 2015-05-09 ENCOUNTER — Ambulatory Visit
Admission: RE | Admit: 2015-05-09 | Discharge: 2015-05-09 | Disposition: A | Discharge status: Home / Self Care | Payer: Medicare Other | Source: Ambulatory Visit | Attending: Radiation Oncology | Admitting: Radiation Oncology

## 2015-05-09 ENCOUNTER — Other Ambulatory Visit: Payer: Self-pay | Admitting: Nurse Practitioner

## 2015-05-09 VITALS — BP 114/73 | HR 100 | Temp 97.6°F | Resp 20 | Wt 169.8 lb

## 2015-05-09 DIAGNOSIS — C169 Malignant neoplasm of stomach, unspecified: Secondary | ICD-10-CM

## 2015-05-09 DIAGNOSIS — Z510 Encounter for antineoplastic radiation therapy: Secondary | ICD-10-CM | POA: Diagnosis not present

## 2015-05-09 NOTE — Progress Notes (Addendum)
Weekly rad txs abdomen completed, 21/25 completed, gaggy every now and then stated, drinks mostly boost, ate sting beans,mashed potatoes, meat,  Water, does have abdominal gas, and 2 loose stool today,wears depends, in w/c, wearing yellow fall risk  Bracelet,  No c/o pain, very weak ,fatigued,good spirits 3:07 PM BP 114/73 mmHg  Pulse 100  Temp(Src) 97.6 F (36.4 C) (Oral)  Resp 20  Wt 169 lb 12.8 oz (77.021 kg)  Wt Readings from Last 3 Encounters:  05/09/15 169 lb 12.8 oz (77.021 kg)  05/02/15 168 lb 6.4 oz (76.386 kg)  04/25/15 176 lb 11.2 oz (80.151 kg)

## 2015-05-09 NOTE — Progress Notes (Signed)
   Department of Radiation Oncology  Phone:  [phone] Fax:        [phone]  Weekly Treatment Note    Name: Benjamin Lawson Date: 05/09/2015 MRN: [id number] DOB: [birth year]   Current dose: 37.8 Gy  Current fraction:21   MEDICATIONS: Current Outpatient Prescriptions  Medication Sig Dispense Refill  . atorvastatin (LIPITOR) 20 MG tablet Take 20 mg by mouth daily.    Marland Kitchen ENSURE PLUS (ENSURE PLUS) LIQD Take 237 mLs by mouth 2 (two) times daily between meals.    . lidocaine-prilocaine (EMLA) cream APPLY 5 ML TOPICALLY TO PORT-A-CATH 1 1/2 HOURS BEFORE CHEMOTHERAPY, AS NEEDED  0  . Lidocaine-Prilocaine, Bulk, 2.5-2.5 % CREA Apply 5 mLs topically as needed. To port-a-cath 1.5 hours before chemotherapy 30 g prn  . metoprolol succinate (TOPROL-XL) 50 MG 24 hr tablet Take 50 mg by mouth daily. Take with or immediately following a meal.    . MICARDIS HCT 80-12.5 MG per tablet Take 1 capsule by mouth daily.    . pantoprazole (PROTONIX) 40 MG tablet Take 1 tablet (40 mg total) by mouth 2 (two) times daily before a meal. 60 tablet 2  . sennosides-docusate sodium (SENOKOT-S) 8.6-50 MG tablet Take 1 tablet by mouth daily.    Marland Kitchen tolterodine (DETROL LA) 4 MG 24 hr capsule Take 4 mg by mouth daily.     No current facility-administered medications for this encounter.     ALLERGIES: Review of patient's allergies indicates no known allergies.   LABORATORY DATA:  Lab Results  Component Value Date   WBC 1.6* 05/08/2015   HGB 9.5* 05/08/2015   HCT 27.8* 05/08/2015   MCV 85.4 05/08/2015   PLT 100* 05/08/2015   Lab Results  Component Value Date   NA 139 05/08/2015   K 3.0* 05/08/2015   CL 111 04/03/2015   CO2 23 05/08/2015   Lab Results  Component Value Date   ALT 16 05/08/2015   AST 19 05/08/2015   ALKPHOS 71 05/08/2015   BILITOT 0.41 05/08/2015     NARRATIVE: Benjamin Lawson was seen today for weekly treatment management. The chart was checked and the patient's films were  reviewed.  Weekly rad txs abdomen completed, 21/25 completed, gaggy every now and then stated, drinks mostly boost, ate sting beans,mashed potatoes, meat,  Water, does have abdominal gas, and 2 loose stool today,wears depends, in w/c, wearing yellow fall risk  Bracelet,  No c/o pain, very weak ,fatigued,good spirits 4:48 PM BP 114/73 mmHg  Pulse 100  Temp(Src) 97.6 F (36.4 C) (Oral)  Resp 20  Wt 169 lb 12.8 oz (77.021 kg)  Wt Readings from Last 3 Encounters:  05/09/15 169 lb 12.8 oz (77.021 kg)  05/02/15 168 lb 6.4 oz (76.386 kg)  04/25/15 176 lb 11.2 oz (80.151 kg)    PHYSICAL EXAMINATION: weight is 169 lb 12.8 oz (77.021 kg). His oral temperature is 97.6 F (36.4 C). His blood pressure is 114/73 and his pulse is 100. His respiration is 20.        ASSESSMENT: The patient is doing satisfactorily with treatment. He is pleased with how he is doing and he says that he is able to eat anything he wants at this time Overall he states that he feels quite good.  PLAN: We will continue with the patient's radiation treatment as planned.

## 2015-05-11 ENCOUNTER — Encounter: Payer: Self-pay | Admitting: Nurse Practitioner

## 2015-05-11 DIAGNOSIS — R609 Edema, unspecified: Secondary | ICD-10-CM | POA: Insufficient documentation

## 2015-05-11 DIAGNOSIS — R110 Nausea: Secondary | ICD-10-CM | POA: Insufficient documentation

## 2015-05-11 DIAGNOSIS — R197 Diarrhea, unspecified: Secondary | ICD-10-CM | POA: Insufficient documentation

## 2015-05-11 DIAGNOSIS — E876 Hypokalemia: Secondary | ICD-10-CM | POA: Insufficient documentation

## 2015-05-11 DIAGNOSIS — E8809 Other disorders of plasma-protein metabolism, not elsewhere classified: Secondary | ICD-10-CM | POA: Insufficient documentation

## 2015-05-11 DIAGNOSIS — E860 Dehydration: Secondary | ICD-10-CM | POA: Insufficient documentation

## 2015-05-11 NOTE — Assessment & Plan Note (Addendum)
Pt with WBC 1.6, ANC 1.3, hgb 9.5, and Plt 100. Pt will proceed with cycle 4 of his carbo/taxol chemo with a 25 % reduction in both due to mild neutropenia and thrombocytopenia. Reviewed all neutropenia guidelines with pt today.   Pt also continues with daily radiation txs as well.   Pt noted to have mild congested cough; but lungs remain clear; and all congestion clears with cough. Pt denies any recent fever or chills.   CXR obtained today with no acute findings.   He will be scheduled for labs, visit, and his next chemo on 05/15/15.

## 2015-05-11 NOTE — Assessment & Plan Note (Signed)
Albumin 2.1.  Pt noted to have peripheral edema to bilat ankles. No SOB noted. Advised pt to increase protein intake.

## 2015-05-11 NOTE — Assessment & Plan Note (Signed)
Pt has +2 chronic ankle edema; but denies any shortness of breath whatsoever.  Albumen is low at 2.1; and her foot edema could be secondary to hypoalbuminemia.  Patient was encouraged to push protein in his diet; and to elevate his legs above the level of his heart whenever possible.  We'll continue to monitor closely.

## 2015-05-11 NOTE — Assessment & Plan Note (Signed)
Pt c/o nausea and mild dehydration. BP low at 91/72.  Pt has also had some diarrhea as well. Pt states that he is having maximum 2 diarrhea stools per day.  Pt given 500 mls IV fluid rehydration; and BP improved.   Advised pt to try OTC imodium as directed.

## 2015-05-11 NOTE — Assessment & Plan Note (Signed)
Pt c/o nausea and mild dehydration. BP low at 91/72.  Pt has also had some diarrhea as well. Pt given 500 mls IV fluid rehydration; and BP improved.

## 2015-05-11 NOTE — Assessment & Plan Note (Signed)
K+ 3.0 today; despite taking KCL 20 meq per day.  Pt given KCL 40 meq while at the cancer center; and will increase K+ tabs to 40 meq per day.

## 2015-05-11 NOTE — Progress Notes (Signed)
SYMPTOM MANAGEMENT CLINIC   HPI: Benjamin Lawson 81 y.o. male diagnosed with gastric cancer.  Currently undergoing carboplatin/Taxol chemotherapy and radiation treatments.  Patient presents to the [facility] today to receive cycle 4 of his chemotherapy.  He reports having some chronic nausea; but no vomiting.  He reports having approximate 2 episodes of diarrhea per day; but has not taken Imodium on a regular basis.  He is also complaining of some congested cough on occasion; but denies any chest pain, chest pressure, shortness of breath, or pain with inspiration.  He also denies any recent fevers or chills.   HPI  ROS  Past Medical History  Diagnosis Date  . Hypertension   . Hypercholesterolemia   . Chronic kidney disease (CKD), stage II (mild)   . Deafness in left ear   . Peripheral vascular disease   . Prostate cancer     Past Surgical History  Procedure Laterality Date  . Multiple tooth extractions  01/2015    "all my lower teeth"  . Transurethral resection of prostate  1990's  . Esophagogastroduodenoscopy N/A 03/28/2015    Procedure: ESOPHAGOGASTRODUODENOSCOPY (EGD);  Surgeon: Richmond Campbell, MD;  Location: [facility] ENDOSCOPY;  Service: Endoscopy;  Laterality: N/A;    has Hypotension; Acute renal failure; Lactic acidosis; Anemia; Alcohol abuse; Diabetic peripheral vascular disorder; Obesity; Chronic kidney disease, stage II (mild); Hyperlipemia; Failure to thrive in adult; Malnutrition of moderate degree; Gastric cancer; Diarrhea; Nausea without vomiting; Hypokalemia; Peripheral edema; Hypoalbuminemia; and Dehydration on his problem list.    has No Known Allergies.    Medication List       This list is accurate as of: 05/08/15 11:59 PM.  Always use your most recent med list.               atorvastatin 20 MG tablet  Commonly known as:  LIPITOR  Take 20 mg by mouth daily.     ENSURE PLUS Liqd  Take 237 mLs by mouth 2 (two) times daily between meals.     Lidocaine-Prilocaine (Bulk) 2.5-2.5 % Crea  Apply 5 mLs topically as needed. To port-a-cath 1.5 hours before chemotherapy     lidocaine-prilocaine cream  Commonly known as:  EMLA  APPLY 5 ML TOPICALLY TO PORT-A-CATH 1 1/2 HOURS BEFORE CHEMOTHERAPY, AS NEEDED     metoprolol succinate 50 MG 24 hr tablet  Commonly known as:  TOPROL-XL  Take 50 mg by mouth daily. Take with or immediately following a meal.     MICARDIS HCT 80-12.5 MG per tablet  Generic drug:  telmisartan-hydrochlorothiazide  Take 1 capsule by mouth daily.     pantoprazole 40 MG tablet  Commonly known as:  PROTONIX  Take 1 tablet (40 mg total) by mouth 2 (two) times daily before a meal.     sennosides-docusate sodium 8.6-50 MG tablet  Commonly known as:  SENOKOT-S  Take 1 tablet by mouth daily.     tolterodine 4 MG 24 hr capsule  Commonly known as:  DETROL LA  Take 4 mg by mouth daily.         PHYSICAL EXAMINATION  Oncology Vitals 05/09/2015 05/08/2015 05/08/2015 05/02/2015 05/01/2015 04/25/2015 04/24/2015  Height - - - - - - -  Weight 77.021 kg - - 76.386 kg - 80.151 kg -  Weight (lbs) 169 lbs 13 oz - - 168 lbs 6 oz - 176 lbs 11 oz -  BMI (kg/m2) - - - - - - -  Temp 97.6 - 98.1 97.8 97.7 98.6  98  Pulse 100 107 126 107 93 100 111  Resp 20 - 20 20 20 20 20   SpO2 - 94 100 98 98 - 98  BSA (m2) - - - - - - -   BP Readings from Last 3 Encounters:  05/09/15 114/73  05/08/15 108/70  05/02/15 103/62    Physical Exam  Constitutional: He is oriented to person, place, and time.  Patient appears fatigued, weak, frail, and chronically ill.  HENT:  Head: Normocephalic and atraumatic.  Mouth/Throat: Oropharynx is clear and moist.  Eyes: Conjunctivae and EOM are normal. Pupils are equal, round, and reactive to light. Right eye exhibits no discharge. Left eye exhibits no discharge. No scleral icterus.  Neck: Normal range of motion. Neck supple. No JVD present. No tracheal deviation present. No thyromegaly present.    Cardiovascular: Normal rate, regular rhythm, normal heart sounds and intact distal pulses.   Pulmonary/Chest: Effort normal and breath sounds normal. No stridor. No respiratory distress. He has no wheezes. He has no rales. He exhibits no tenderness.  Congested cough that appears to mainly be in upper airway; and clears with cough.   Abdominal: Soft. Bowel sounds are normal. He exhibits no distension and no mass. There is no tenderness. There is no rebound and no guarding.  Musculoskeletal: Normal range of motion. He exhibits edema. He exhibits no tenderness.  +2 ankle edema.  Lymphadenopathy:    He has no cervical adenopathy.  Neurological: He is alert and oriented to person, place, and time.  Skin: Skin is warm and dry. No rash noted. No erythema. No pallor.  Psychiatric: Affect normal.  Nursing note and vitals reviewed.   LABORATORY DATA:. Appointment on 05/08/2015  Component Date Value Ref Range Status  . WBC 05/08/2015 1.6* 4.0 - 10.3 10e3/uL Final  . NEUT# 05/08/2015 1.3* 1.5 - 6.5 10e3/uL Final  . HGB 05/08/2015 9.5* 13.0 - 17.1 g/dL Final  . HCT 05/08/2015 27.8* 38.4 - 49.9 % Final  . Platelets 05/08/2015 100* 140 - 400 10e3/uL Final  . MCV 05/08/2015 85.4  79.3 - 98.0 fL Final  . MCH 05/08/2015 29.1  27.2 - 33.4 pg Final  . MCHC 05/08/2015 34.1  32.0 - 36.0 g/dL Final  . RBC 05/08/2015 3.25* 4.20 - 5.82 10e6/uL Final  . RDW 05/08/2015 14.8* 11.0 - 14.6 % Final  . lymph# 05/08/2015 0.1* 0.9 - 3.3 10e3/uL Final  . MONO# 05/08/2015 0.1  0.1 - 0.9 10e3/uL Final  . Eosinophils Absolute 05/08/2015 0.0  0.0 - 0.5 10e3/uL Final  . Basophils Absolute 05/08/2015 0.0  0.0 - 0.1 10e3/uL Final  . NEUT% 05/08/2015 83.7* 39.0 - 75.0 % Final  . LYMPH% 05/08/2015 7.3* 14.0 - 49.0 % Final  . MONO% 05/08/2015 8.3  0.0 - 14.0 % Final  . EOS% 05/08/2015 0.2  0.0 - 7.0 % Final  . BASO% 05/08/2015 0.5  0.0 - 2.0 % Final  . Sodium 05/08/2015 139  136 - 145 mEq/L Final  . Potassium 05/08/2015  3.0* 3.5 - 5.1 mEq/L Final  . Chloride 05/08/2015 107  98 - 109 mEq/L Final  . CO2 05/08/2015 23  22 - 29 mEq/L Final  . Glucose 05/08/2015 103  70 - 140 mg/dl Final  . BUN 05/08/2015 14.2  7.0 - 26.0 mg/dL Final  . Creatinine 05/08/2015 1.3  0.7 - 1.3 mg/dL Final  . Total Bilirubin 05/08/2015 0.41  0.20 - 1.20 mg/dL Final  . Alkaline Phosphatase 05/08/2015 71  40 - 150 U/L Final  .  AST 05/08/2015 19  5 - 34 U/L Final  . ALT 05/08/2015 16  0 - 55 U/L Final  . Total Protein 05/08/2015 5.0* 6.4 - 8.3 g/dL Final  . Albumin 05/08/2015 2.1* 3.5 - 5.0 g/dL Final  . Calcium 05/08/2015 7.7* 8.4 - 10.4 mg/dL Final  . Anion Gap 05/08/2015 10  3 - 11 mEq/L Final  . EGFR 05/08/2015 61* >90 ml/min/1.73 m2 Final   eGFR is calculated using the CKD-EPI Creatinine Equation (2009)     RADIOGRAPHIC STUDIES: Dg Chest 2 View  05/08/2015   CLINICAL DATA:  Shortness of breath. Cough and chest congestion. Gastric cancer.  EXAM: CHEST  2 VIEW  COMPARISON:  Chest x-rays dated 04/02/2015 and 03/27/2015  FINDINGS: Heart size and pulmonary vascularity are normal. Power port in good position. Chronic elevation the left hemidiaphragm. Gaseous distention of the colon. No osseous abnormality.  IMPRESSION: No acute disease in the chest. Chronic elevation of the left hemidiaphragm with gaseous distention of the colon, slightly more prominent than on prior exams.   Electronically Signed   By: Lorriane Shire M.D.   On: 05/08/2015 14:34    ASSESSMENT/PLAN:    Dehydration Pt c/o nausea and mild dehydration. BP low at 91/72.  Pt has also had some diarrhea as well. Pt given 500 mls IV fluid rehydration; and BP improved.   Diarrhea Pt c/o nausea and mild dehydration. BP low at 91/72.  Pt has also had some diarrhea as well. Pt states that he is having maximum 2 diarrhea stools per day.  Pt given 500 mls IV fluid rehydration; and BP improved.   Advised pt to try OTC imodium as directed.   Gastric cancer Pt with WBC 1.6, ANC  1.3, hgb 9.5, and Plt 100. Pt will proceed with cycle 4 of his carbo/taxol chemo with a 25 % reduction in both due to mild neutropenia and thrombocytopenia. Reviewed all neutropenia guidelines with pt today.   Pt also continues with daily radiation txs as well.   He will be scheduled for labs, visit, and his next chemo on 05/15/15.   Hypoalbuminemia Albumin 2.1.  Pt noted to have peripheral edema to bilat ankles. No SOB noted. Advised pt to increase protein intake.   Hypokalemia K+ 3.0 today; despite taking KCL 20 meq per day.  Pt given KCL 40 meq while at the cancer center; and will increase K+ tabs to 40 meq per day.   Nausea without vomiting C/o nausea; but no vomiting.  Already has antiemetics at home to use as needed. Refused nausea meds while at cancer center.  Peripheral edema Pt has +2 chronic ankle edema; but denies any shortness of breath whatsoever.  Albumen is low at 2.1; and her foot edema could be secondary to hypoalbuminemia.  Patient was encouraged to push protein in his diet; and to elevate his legs above the level of his heart whenever possible.  We'll continue to monitor closely.  Patient stated understanding of all instructions; and was in agreement with this plan of care. The patient knows to call the clinic with any problems, questions or concerns.   Review/collaboration with Dr. Benay Spice  regarding all aspects of patient's visit today.   Total time spent with patient was 25 minutes;  with greater than 60 percent of that time spent in face to face counseling regarding patient's symptoms,  and coordination of care and follow up.  Disclaimer:This dictation was prepared with Dragon/digital dictation along with [employer]. Any transcriptional errors that result from  this process are unintentional.  Drue Second, NP 05/11/2015

## 2015-05-11 NOTE — Assessment & Plan Note (Signed)
C/o nausea; but no vomiting.  Already has antiemetics at home to use as needed. Refused nausea meds while at cancer center.

## 2015-05-12 ENCOUNTER — Telehealth: Payer: Self-pay | Admitting: Nurse Practitioner

## 2015-05-12 ENCOUNTER — Ambulatory Visit
Admit: 2015-05-12 | Discharge: 2015-05-12 | Disposition: A | Discharge status: Home / Self Care | Payer: Medicare Other | Attending: Radiation Oncology | Admitting: Radiation Oncology

## 2015-05-12 DIAGNOSIS — Z510 Encounter for antineoplastic radiation therapy: Secondary | ICD-10-CM | POA: Diagnosis not present

## 2015-05-12 NOTE — Telephone Encounter (Signed)
Confirmed appointment for 09/15.

## 2015-05-13 ENCOUNTER — Ambulatory Visit: Payer: Medicare Other | Admitting: Radiation Oncology

## 2015-05-13 ENCOUNTER — Ambulatory Visit
Admit: 2015-05-13 | Discharge: 2015-05-13 | Disposition: A | Discharge status: Home / Self Care | Payer: Medicare Other | Attending: Radiation Oncology | Admitting: Radiation Oncology

## 2015-05-13 DIAGNOSIS — Z510 Encounter for antineoplastic radiation therapy: Secondary | ICD-10-CM | POA: Diagnosis not present

## 2015-05-14 ENCOUNTER — Encounter: Payer: Self-pay | Admitting: Radiation Oncology

## 2015-05-14 ENCOUNTER — Other Ambulatory Visit: Payer: Self-pay | Admitting: Oncology

## 2015-05-14 ENCOUNTER — Ambulatory Visit
Admission: RE | Admit: 2015-05-14 | Discharge: 2015-05-14 | Disposition: A | Discharge status: Home / Self Care | Payer: Medicare Other | Source: Ambulatory Visit | Attending: Radiation Oncology | Admitting: Radiation Oncology

## 2015-05-14 DIAGNOSIS — Z510 Encounter for antineoplastic radiation therapy: Secondary | ICD-10-CM | POA: Diagnosis not present

## 2015-05-15 ENCOUNTER — Other Ambulatory Visit (HOSPITAL_BASED_OUTPATIENT_CLINIC_OR_DEPARTMENT_OTHER): Payer: Medicare Other

## 2015-05-15 ENCOUNTER — Other Ambulatory Visit: Payer: Self-pay | Admitting: *Deleted

## 2015-05-15 ENCOUNTER — Ambulatory Visit (HOSPITAL_BASED_OUTPATIENT_CLINIC_OR_DEPARTMENT_OTHER): Payer: Medicare Other | Admitting: Nurse Practitioner

## 2015-05-15 ENCOUNTER — Encounter: Payer: Self-pay | Admitting: Nurse Practitioner

## 2015-05-15 ENCOUNTER — Ambulatory Visit (HOSPITAL_BASED_OUTPATIENT_CLINIC_OR_DEPARTMENT_OTHER): Payer: Medicare Other

## 2015-05-15 ENCOUNTER — Ambulatory Visit
Admission: RE | Admit: 2015-05-15 | Discharge: 2015-05-15 | Disposition: A | Discharge status: Home / Self Care | Payer: Medicare Other | Source: Ambulatory Visit | Attending: Radiation Oncology | Admitting: Radiation Oncology

## 2015-05-15 ENCOUNTER — Other Ambulatory Visit: Payer: Self-pay | Admitting: Nurse Practitioner

## 2015-05-15 ENCOUNTER — Ambulatory Visit: Payer: Medicare Other

## 2015-05-15 VITALS — BP 96/67 | HR 121

## 2015-05-15 VITALS — BP 96/70 | HR 136 | Temp 97.6°F | Resp 17 | Ht 71.0 in | Wt 165.5 lb

## 2015-05-15 DIAGNOSIS — C160 Malignant neoplasm of cardia: Secondary | ICD-10-CM

## 2015-05-15 DIAGNOSIS — E876 Hypokalemia: Secondary | ICD-10-CM | POA: Diagnosis present

## 2015-05-15 DIAGNOSIS — E860 Dehydration: Secondary | ICD-10-CM

## 2015-05-15 DIAGNOSIS — C169 Malignant neoplasm of stomach, unspecified: Secondary | ICD-10-CM

## 2015-05-15 DIAGNOSIS — R197 Diarrhea, unspecified: Secondary | ICD-10-CM | POA: Diagnosis not present

## 2015-05-15 DIAGNOSIS — Z95828 Presence of other vascular implants and grafts: Secondary | ICD-10-CM

## 2015-05-15 DIAGNOSIS — Z510 Encounter for antineoplastic radiation therapy: Secondary | ICD-10-CM | POA: Diagnosis not present

## 2015-05-15 LAB — CBC WITH DIFFERENTIAL/PLATELET
BASO%: 1.4 % (ref 0.0–2.0)
Basophils Absolute: 0 10*3/uL (ref 0.0–0.1)
EOS%: 0 % (ref 0.0–7.0)
Eosinophils Absolute: 0 10*3/uL (ref 0.0–0.5)
HEMATOCRIT: 26.3 % — AB (ref 38.4–49.9)
HGB: 8.6 g/dL — ABNORMAL LOW (ref 13.0–17.1)
LYMPH#: 0.1 10*3/uL — AB (ref 0.9–3.3)
LYMPH%: 11.1 % — AB (ref 14.0–49.0)
MCH: 28 pg (ref 27.2–33.4)
MCHC: 32.7 g/dL (ref 32.0–36.0)
MCV: 85.7 fL (ref 79.3–98.0)
MONO#: 0.1 10*3/uL (ref 0.1–0.9)
MONO%: 9.7 % (ref 0.0–14.0)
NEUT%: 77.8 % — AB (ref 39.0–75.0)
NEUTROS ABS: 0.6 10*3/uL — AB (ref 1.5–6.5)
Platelets: 119 10*3/uL — ABNORMAL LOW (ref 140–400)
RBC: 3.07 10*6/uL — ABNORMAL LOW (ref 4.20–5.82)
RDW: 16.1 % — ABNORMAL HIGH (ref 11.0–14.6)
WBC: 0.7 10*3/uL — AB (ref 4.0–10.3)
nRBC: 0 % (ref 0–0)

## 2015-05-15 LAB — COMPREHENSIVE METABOLIC PANEL (CC13)
ALT: 14 U/L (ref 0–55)
AST: 21 U/L (ref 5–34)
Albumin: 2.3 g/dL — ABNORMAL LOW (ref 3.5–5.0)
Alkaline Phosphatase: 65 U/L (ref 40–150)
Anion Gap: 6 mEq/L (ref 3–11)
BUN: 17.7 mg/dL (ref 7.0–26.0)
CHLORIDE: 112 meq/L — AB (ref 98–109)
CO2: 23 meq/L (ref 22–29)
CREATININE: 1.2 mg/dL (ref 0.7–1.3)
Calcium: 8.4 mg/dL (ref 8.4–10.4)
EGFR: 64 mL/min/{1.73_m2} — ABNORMAL LOW (ref >90)
GLUCOSE: 171 mg/dL — AB (ref 70–140)
POTASSIUM: 4.4 meq/L (ref 3.5–5.1)
SODIUM: 142 meq/L (ref 136–145)
Total Bilirubin: 0.23 mg/dL (ref 0.20–1.20)
Total Protein: 5.4 g/dL — ABNORMAL LOW (ref 6.4–8.3)

## 2015-05-15 LAB — TECHNOLOGIST REVIEW

## 2015-05-15 MED ORDER — SODIUM CHLORIDE 0.9 % IV SOLN
INTRAVENOUS | Status: AC
  Administered 2015-05-15: 12:00:00 via INTRAVENOUS

## 2015-05-15 MED ORDER — SODIUM CHLORIDE 0.9 % IJ SOLN
10.0000 mL | INTRAMUSCULAR | Status: DC | PRN
  Administered 2015-05-15: 10 mL via INTRAVENOUS
  Filled 2015-05-15: qty 10

## 2015-05-15 MED ORDER — HEPARIN SOD (PORK) LOCK FLUSH 100 UNIT/ML IV SOLN
500.0000 [IU] | Freq: Once | INTRAVENOUS | Status: AC
  Administered 2015-05-15: 500 [IU] via INTRAVENOUS
  Filled 2015-05-15: qty 5

## 2015-05-15 NOTE — Assessment & Plan Note (Signed)
Patient reports having 2-3 diarrhea episodes per day; and is unclear if he is taking Imodium-since he is at a assisted living facility.  He feels slightly dehydrated today.  Blood pressure is 96/70 with a heart rate of 121-136.  Chemotherapy will be held today; and patient will instead receive IV fluid rehydration.  Also advised both patient and his daughter to confirm that he is receiving Imodium on an as-needed basis while at the assisted living facility.  Also advised both patient and his daughter to confirm the patient is not receiving either stool softeners or laxity of solid assisted living facility.

## 2015-05-15 NOTE — Progress Notes (Signed)
[id number], BP-96/67-C. Berniece Salines NP notified and OK for pt to be discharged to home at this time.

## 2015-05-15 NOTE — Patient Instructions (Signed)
Dehydration, Adult Dehydration is when you lose more fluids from the body than you take in. Vital organs like the kidneys, brain, and heart cannot function without a proper amount of fluids and salt. Any loss of fluids from the body can cause dehydration.  CAUSES   Vomiting.  Diarrhea.  Excessive sweating.  Excessive urine output.  Fever. SYMPTOMS  Mild dehydration  Thirst.  Dry lips.  Slightly dry mouth. Moderate dehydration  Very dry mouth.  Sunken eyes.  Skin does not bounce back quickly when lightly pinched and released.  Dark urine and decreased urine production.  Decreased tear production.  Headache. Severe dehydration  Very dry mouth.  Extreme thirst.  Rapid, weak pulse (more than 100 beats per minute at rest).  Cold hands and feet.  Not able to sweat in spite of heat and temperature.  Rapid breathing.  Blue lips.  Confusion and lethargy.  Difficulty being awakened.  Minimal urine production.  No tears. DIAGNOSIS  Your caregiver will diagnose dehydration based on your symptoms and your exam. Blood and urine tests will help confirm the diagnosis. The diagnostic evaluation should also identify the cause of dehydration. TREATMENT  Treatment of mild or moderate dehydration can often be done at home by increasing the amount of fluids that you drink. It is best to drink small amounts of fluid more often. Drinking too much at one time can make vomiting worse. Refer to the home care instructions below. Severe dehydration needs to be treated at the hospital where you will probably be given intravenous (IV) fluids that contain water and electrolytes. HOME CARE INSTRUCTIONS   Ask your caregiver about specific rehydration instructions.  Drink enough fluids to keep your urine clear or pale yellow.  Drink small amounts frequently if you have nausea and vomiting.  Eat as you normally do.  Avoid:  Foods or drinks high in sugar.  Carbonated  drinks.  Juice.  Extremely hot or cold fluids.  Drinks with caffeine.  Fatty, greasy foods.  Alcohol.  Tobacco.  Overeating.  Gelatin desserts.  Wash your hands well to avoid spreading bacteria and viruses.  Only take over-the-counter or prescription medicines for pain, discomfort, or fever as directed by your caregiver.  Ask your caregiver if you should continue all prescribed and over-the-counter medicines.  Keep all follow-up appointments with your caregiver. SEEK MEDICAL CARE IF:  You have abdominal pain and it increases or stays in one area (localizes).  You have a rash, stiff neck, or severe headache.  You are irritable, sleepy, or difficult to awaken.  You are weak, dizzy, or extremely thirsty. SEEK IMMEDIATE MEDICAL CARE IF:   You are unable to keep fluids down or you get worse despite treatment.  You have frequent episodes of vomiting or diarrhea.  You have blood or green matter (bile) in your vomit.  You have blood in your stool or your stool looks black and tarry.  You have not urinated in 6 to 8 hours, or you have only urinated a small amount of very dark urine.  You have a fever.  You faint. MAKE SURE YOU:   Understand these instructions.  Will watch your condition.  Will get help right away if you are not doing well or get worse. Document Released: 08/16/2005 Document Revised: 11/08/2011 Document Reviewed: 04/05/2011 Exit[employer] Patient Information 2015 [employer]. This information is not intended to replace advice given to you by your health care provider. Make sure you discuss any questions you have with your health care   provider.  

## 2015-05-15 NOTE — Assessment & Plan Note (Signed)
Patient presented to the [facility] today to receive his next cycle of carboplatin/Taxol chemotherapy.  He also continues to receive daily radiation treatments.  Patient reports continued episodes of diarrhea; and mild dehydration symptoms.  Blood pressure low at 96/70; but this is patient's baseline since initiating chemotherapy.  Patient denies any other specific symptoms.  He denies any recent fevers or chills.  Blood counts obtained today reveal a to B BC 0.7, ANC 0.6, hemoglobin 8.6, and platelet count 119.  Reviewed all findings with Dr. Irene Limbo (on call); and decision was made to hold chemotherapy today.  Patient received IV fluid rehydration while at the cancer Center today.  Patient will complete his final radiation treatment on 05/20/2015.  Patient will return for labs, follow up visit, and his next cycle of chemotherapy on 05/22/2015.

## 2015-05-15 NOTE — Progress Notes (Signed)
SYMPTOM MANAGEMENT CLINIC   HPI: Benjamin Lawson 81 y.o. male diagnosed with gastric cancer.  Currently undergoing carboplatin/Taxol chemotherapy and radiation treatments.  Patient reports having 2-3 diarrhea episodes per day; and is unclear if he is taking Imodium-since he is at a assisted living facility.  He feels slightly dehydrated today.  Blood pressure is 96/70 with a heart rate of 121-136.  Diarrhea     Review of Systems  Gastrointestinal: Positive for diarrhea.    Past Medical History  Diagnosis Date  . Hypertension   . Hypercholesterolemia   . Chronic kidney disease (CKD), stage II (mild)   . Deafness in left ear   . Peripheral vascular disease   . Prostate cancer     Past Surgical History  Procedure Laterality Date  . Multiple tooth extractions  01/2015    "all my lower teeth"  . Transurethral resection of prostate  1990's  . Esophagogastroduodenoscopy N/A 03/28/2015    Procedure: ESOPHAGOGASTRODUODENOSCOPY (EGD);  Surgeon: Richmond Campbell, MD;  Location: [facility] ENDOSCOPY;  Service: Endoscopy;  Laterality: N/A;    has Hypotension; Acute renal failure; Lactic acidosis; Anemia; Alcohol abuse; Diabetic peripheral vascular disorder; Obesity; Chronic kidney disease, stage II (mild); Hyperlipemia; Failure to thrive in adult; Malnutrition of moderate degree; Gastric cancer; Diarrhea; Nausea without vomiting; Hypokalemia; Peripheral edema; Hypoalbuminemia; and Dehydration on his problem list.    has No Known Allergies.    Medication List       This list is accurate as of: 05/15/15  4:56 PM.  Always use your most recent med list.               atorvastatin 20 MG tablet  Commonly known as:  LIPITOR  Take 20 mg by mouth daily.     ENSURE PLUS Liqd  Take 237 mLs by mouth 2 (two) times daily between meals.     Lidocaine-Prilocaine (Bulk) 2.5-2.5 % Crea  Apply 5 mLs topically as needed. To port-a-cath 1.5 hours before chemotherapy     lidocaine-prilocaine cream    Commonly known as:  EMLA  APPLY 5 ML TOPICALLY TO PORT-A-CATH 1 1/2 HOURS BEFORE CHEMOTHERAPY, AS NEEDED     metoprolol succinate 50 MG 24 hr tablet  Commonly known as:  TOPROL-XL  Take 50 mg by mouth daily. Take with or immediately following a meal.     MICARDIS HCT 80-12.5 MG per tablet  Generic drug:  telmisartan-hydrochlorothiazide  Take 1 capsule by mouth daily.     pantoprazole 40 MG tablet  Commonly known as:  PROTONIX  Take 1 tablet (40 mg total) by mouth 2 (two) times daily before a meal.     sennosides-docusate sodium 8.6-50 MG tablet  Commonly known as:  SENOKOT-S  Take 1 tablet by mouth daily.     tolterodine 4 MG 24 hr capsule  Commonly known as:  DETROL LA  Take 4 mg by mouth daily.         PHYSICAL EXAMINATION  Oncology Vitals 05/15/2015 05/15/2015 05/15/2015 05/09/2015 05/08/2015 05/08/2015 05/02/2015  Height - - 180 cm - - - -  Weight - - 75.07 kg 77.021 kg - - 76.386 kg  Weight (lbs) - - 165 lbs 8 oz 169 lbs 13 oz - - 168 lbs 6 oz  BMI (kg/m2) - - 23.08 kg/m2 - - - -  Temp - - 97.6 97.6 - 98.1 97.8  Pulse 121 118 136 100 107 126 107  Resp - - 17 20 - 20 20  SpO2 -  100 - - 94 100 98  BSA (m2) - - 1.94 m2 - - - -   BP Readings from Last 3 Encounters:  05/15/15 96/67  05/15/15 96/70  05/09/15 114/73    Physical Exam  Constitutional: He is oriented to person, place, and time.  Patient appears fatigued, weak, frail, and chronically ill.  HENT:  Head: Normocephalic and atraumatic.  Mouth/Throat: Oropharynx is clear and moist.  Eyes: Conjunctivae and EOM are normal. Pupils are equal, round, and reactive to light. Right eye exhibits no discharge. Left eye exhibits no discharge. No scleral icterus.  Neck: Normal range of motion. Neck supple. No JVD present. No tracheal deviation present. No thyromegaly present.  Cardiovascular: Normal rate, regular rhythm, normal heart sounds and intact distal pulses.   Pulmonary/Chest: Effort normal and breath sounds normal. No  stridor. No respiratory distress. He has no wheezes. He has no rales. He exhibits no tenderness.  Abdominal: Soft. Bowel sounds are normal. He exhibits no distension and no mass. There is no tenderness. There is no rebound and no guarding.  Musculoskeletal: Normal range of motion. He exhibits no edema or tenderness.  Lymphadenopathy:    He has no cervical adenopathy.  Neurological: He is alert and oriented to person, place, and time.  Skin: Skin is warm and dry. No rash noted. No erythema. No pallor.  Psychiatric: Affect normal.  Nursing note and vitals reviewed.   LABORATORY DATA:. Appointment on 05/15/2015  Component Date Value Ref Range Status  . WBC 05/15/2015 0.7* 4.0 - 10.3 10e3/uL Final  . NEUT# 05/15/2015 0.6* 1.5 - 6.5 10e3/uL Final  . HGB 05/15/2015 8.6* 13.0 - 17.1 g/dL Final  . HCT 05/15/2015 26.3* 38.4 - 49.9 % Final  . Platelets 05/15/2015 119* 140 - 400 10e3/uL Final  . MCV 05/15/2015 85.7  79.3 - 98.0 fL Final  . MCH 05/15/2015 28.0  27.2 - 33.4 pg Final  . MCHC 05/15/2015 32.7  32.0 - 36.0 g/dL Final  . RBC 05/15/2015 3.07* 4.20 - 5.82 10e6/uL Final  . RDW 05/15/2015 16.1* 11.0 - 14.6 % Final  . lymph# 05/15/2015 0.1* 0.9 - 3.3 10e3/uL Final  . MONO# 05/15/2015 0.1  0.1 - 0.9 10e3/uL Final  . Eosinophils Absolute 05/15/2015 0.0  0.0 - 0.5 10e3/uL Final  . Basophils Absolute 05/15/2015 0.0  0.0 - 0.1 10e3/uL Final  . NEUT% 05/15/2015 77.8* 39.0 - 75.0 % Final  . LYMPH% 05/15/2015 11.1* 14.0 - 49.0 % Final  . MONO% 05/15/2015 9.7  0.0 - 14.0 % Final  . EOS% 05/15/2015 0.0  0.0 - 7.0 % Final  . BASO% 05/15/2015 1.4  0.0 - 2.0 % Final  . nRBC 05/15/2015 0  0 - 0 % Final  . Sodium 05/15/2015 142  136 - 145 mEq/L Final  . Potassium 05/15/2015 4.4  3.5 - 5.1 mEq/L Final  . Chloride 05/15/2015 112* 98 - 109 mEq/L Final  . CO2 05/15/2015 23  22 - 29 mEq/L Final  . Glucose 05/15/2015 171* 70 - 140 mg/dl Final   Glucose reference range is for nonfasting patients. Fasting  glucose reference range is 70- 100.  Marland Kitchen BUN 05/15/2015 17.7  7.0 - 26.0 mg/dL Final  . Creatinine 05/15/2015 1.2  0.7 - 1.3 mg/dL Final  . Total Bilirubin 05/15/2015 0.23  0.20 - 1.20 mg/dL Final  . Alkaline Phosphatase 05/15/2015 65  40 - 150 U/L Final  . AST 05/15/2015 21  5 - 34 U/L Final  . ALT 05/15/2015 14  0 - 55 U/L Final  . Total Protein 05/15/2015 5.4* 6.4 - 8.3 g/dL Final  . Albumin 05/15/2015 2.3* 3.5 - 5.0 g/dL Final  . Calcium 05/15/2015 8.4  8.4 - 10.4 mg/dL Final  . Anion Gap 05/15/2015 6  3 - 11 mEq/L Final  . EGFR 05/15/2015 64* >90 ml/min/1.73 m2 Final   eGFR is calculated using the CKD-EPI Creatinine Equation (2009)  . Technologist Review 05/15/2015 rare nrbc, few schistocytes, mod helmet cells, many acanthocytes   Final     RADIOGRAPHIC STUDIES: No results found.  ASSESSMENT/PLAN:    Hypokalemia Potassium has improved to 4.4 today.  Patient states that he continues to take the potassium twice daily as directed.  Gastric cancer Patient presented to the [facility] today to receive his next cycle of carboplatin/Taxol chemotherapy.  He also continues to receive daily radiation treatments.  Patient reports continued episodes of diarrhea; and mild dehydration symptoms.  Blood pressure low at 96/70; but this is patient's baseline since initiating chemotherapy.  Patient denies any other specific symptoms.  He denies any recent fevers or chills.  Blood counts obtained today reveal a to B BC 0.7, ANC 0.6, hemoglobin 8.6, and platelet count 119.  Reviewed all findings with Dr. Irene Limbo (on call); and decision was made to hold chemotherapy today.  Patient received IV fluid rehydration while at the cancer Center today.  Patient will complete his final radiation treatment on 05/20/2015.  Patient will return for labs, follow up visit, and his next cycle of chemotherapy on 05/22/2015.  Diarrhea Patient reports having 2-3 diarrhea episodes per day; and is unclear if he is  taking Imodium-since he is at a assisted living facility.  He feels slightly dehydrated today.  Blood pressure is 96/70 with a heart rate of 121-136.  Chemotherapy will be held today; and patient will instead receive IV fluid rehydration.  Also advised both patient and his daughter to confirm that he is receiving Imodium on an as-needed basis while at the assisted living facility.  Also advised both patient and his daughter to confirm the patient is not receiving either stool softeners or laxity of solid assisted living facility.  Dehydration Patient reports having 2-3 diarrhea episodes per day; and is unclear if he is taking Imodium-since he is at a assisted living facility.  He feels slightly dehydrated today.  Blood pressure is 96/70 with a heart rate of 121-136.  Chemotherapy will be held today; and patient will instead receive IV fluid rehydration.  Patient was also advised to push fluids at home is much as possible.  Also advised both patient and his daughter to confirm that he is receiving Imodium on an as-needed basis while at the assisted living facility.  Also advised both patient and his daughter to confirm the patient is not receiving either stool softeners or laxity of solid assisted living facility.  Heart rate did improve to 120 prior to discharge.   Patient stated understanding of all instructions; and was in agreement with this plan of care. The patient knows to call the clinic with any problems, questions or concerns.   Review/collaboration with Dr. Irene Limbo (on call) regarding all aspects of patient's visit today.   Total time spent with patient was 25 minutes;  with greater than 60 percent of that time spent in face to face counseling regarding patient's symptoms,  and coordination of care and follow up.  Disclaimer:This dictation was prepared with Dragon/digital dictation along with [employer]. Any transcriptional errors that result from this  process are  unintentional.  Drue Second, NP 05/15/2015

## 2015-05-15 NOTE — Progress Notes (Signed)
Pt taken to infusion rm. Report given to [facility]; VS to be taken after 1 hr IVFs at 526mL/hr  1653- Report of consultation faxed to [facility] at [phone]

## 2015-05-15 NOTE — Patient Instructions (Signed)

## 2015-05-15 NOTE — Assessment & Plan Note (Signed)
Potassium has improved to 4.4 today.  Patient states that he continues to take the potassium twice daily as directed.

## 2015-05-15 NOTE — Assessment & Plan Note (Signed)
Patient reports having 2-3 diarrhea episodes per day; and is unclear if he is taking Imodium-since he is at a assisted living facility.  He feels slightly dehydrated today.  Blood pressure is 96/70 with a heart rate of 121-136.  Chemotherapy will be held today; and patient will instead receive IV fluid rehydration.  Patient was also advised to push fluids at home is much as possible.  Also advised both patient and his daughter to confirm that he is receiving Imodium on an as-needed basis while at the assisted living facility.  Also advised both patient and his daughter to confirm the patient is not receiving either stool softeners or laxity of solid assisted living facility.  Heart rate did improve to 120 prior to discharge.

## 2015-05-16 ENCOUNTER — Telehealth: Payer: Self-pay | Admitting: Oncology

## 2015-05-16 ENCOUNTER — Ambulatory Visit
Admission: RE | Admit: 2015-05-16 | Discharge: 2015-05-16 | Disposition: A | Discharge status: Home / Self Care | Payer: Medicare Other | Source: Ambulatory Visit | Attending: Radiation Oncology | Admitting: Radiation Oncology

## 2015-05-16 DIAGNOSIS — Z510 Encounter for antineoplastic radiation therapy: Secondary | ICD-10-CM | POA: Diagnosis not present

## 2015-05-16 NOTE — Telephone Encounter (Signed)
s.w. pt and advised on sept appt.....pt ok and aware °

## 2015-05-19 ENCOUNTER — Ambulatory Visit
Admission: RE | Admit: 2015-05-19 | Discharge: 2015-05-19 | Disposition: A | Discharge status: Home / Self Care | Payer: Medicare Other | Source: Ambulatory Visit | Attending: Radiation Oncology | Admitting: Radiation Oncology

## 2015-05-19 ENCOUNTER — Encounter: Payer: Self-pay | Admitting: Radiation Oncology

## 2015-05-19 ENCOUNTER — Inpatient Hospital Stay: Admission: RE | Admit: 2015-05-19 | Payer: Self-pay | Source: Ambulatory Visit | Admitting: Radiation Oncology

## 2015-05-19 DIAGNOSIS — C169 Malignant neoplasm of stomach, unspecified: Secondary | ICD-10-CM

## 2015-05-19 DIAGNOSIS — Z510 Encounter for antineoplastic radiation therapy: Secondary | ICD-10-CM | POA: Diagnosis not present

## 2015-05-19 NOTE — Progress Notes (Signed)
   Department of Radiation Oncology  Phone:  [phone] Fax:        [phone]  Weekly Treatment Note    Name: Benjamin Lawson Date: 05/19/2015 MRN: [id number] DOB: August 25, 1933   Current dose: 48.6 Gy  Current fraction: 27   MEDICATIONS: Current Outpatient Prescriptions  Medication Sig Dispense Refill  . atorvastatin (LIPITOR) 20 MG tablet Take 20 mg by mouth daily.    Marland Kitchen ENSURE PLUS (ENSURE PLUS) LIQD Take 237 mLs by mouth 2 (two) times daily between meals.    . lidocaine-prilocaine (EMLA) cream APPLY 5 ML TOPICALLY TO PORT-A-CATH 1 1/2 HOURS BEFORE CHEMOTHERAPY, AS NEEDED  0  . Lidocaine-Prilocaine, Bulk, 2.5-2.5 % CREA Apply 5 mLs topically as needed. To port-a-cath 1.5 hours before chemotherapy 30 g prn  . metoprolol succinate (TOPROL-XL) 50 MG 24 hr tablet Take 50 mg by mouth daily. Take with or immediately following a meal.    . MICARDIS HCT 80-12.5 MG per tablet Take 1 capsule by mouth daily.    . pantoprazole (PROTONIX) 40 MG tablet Take 1 tablet (40 mg total) by mouth 2 (two) times daily before a meal. 60 tablet 2  . sennosides-docusate sodium (SENOKOT-S) 8.6-50 MG tablet Take 1 tablet by mouth daily.    Marland Kitchen tolterodine (DETROL LA) 4 MG 24 hr capsule Take 4 mg by mouth daily.     No current facility-administered medications for this encounter.     ALLERGIES: Review of patient's allergies indicates no known allergies.   LABORATORY DATA:  Lab Results  Component Value Date   WBC 0.7* 05/15/2015   HGB 8.6* 05/15/2015   HCT 26.3* 05/15/2015   MCV 85.7 05/15/2015   PLT 119* 05/15/2015   Lab Results  Component Value Date   NA 142 05/15/2015   K 4.4 05/15/2015   CL 111 04/03/2015   CO2 23 05/15/2015   Lab Results  Component Value Date   ALT 14 05/15/2015   AST 21 05/15/2015   ALKPHOS 65 05/15/2015   BILITOT 0.23 05/15/2015     NARRATIVE: Burnett Harry was seen today for weekly treatment management. The chart was checked and the patient's films were  reviewed.  Denies nausea. Wearing mask and gloves. No new complaints - states swallowing well with good appetite.  PHYSICAL EXAMINATION: vitals were not taken for this visit. alert, no distress  ASSESSMENT: The patient is doing satisfactorily with treatment.   PLAN: We will continue with the patient's radiation treatment as planned. Last treatment tomorrow. Follow up in one month.   ------------------------------------------------  Jodelle Gross, MD, PhD  This document serves as a record of services personally performed by Kyung Rudd, MD. It was created on his behalf by Derek Mound, a trained medical scribe. The creation of this record is based on the scribe's personal observations and the Ramaya Guile's statements to them. This document has been checked and approved by the attending Saranne Crislip.

## 2015-05-19 NOTE — Progress Notes (Signed)
Weekly rad txs gastric 1 more treatment, pateint is  wearing g loves and mask, in w/c, no more diarrhea stated patient, no nausea, , male friend doesn't know why, patient having some confusion today, no pain or nausea 2:53 PM BP 104/78 mmHg  Pulse 140  Temp(Src) 97.6 F (36.4 C) (Oral)  Resp 20  Wt 164 lb 8 oz (74.617 kg)  Wt Readings from Last 3 Encounters:  05/19/15 164 lb 8 oz (74.617 kg)  05/15/15 165 lb 8 oz (75.07 kg)  05/09/15 169 lb 12.8 oz (77.021 kg)

## 2015-05-20 ENCOUNTER — Ambulatory Visit
Admission: RE | Admit: 2015-05-20 | Discharge: 2015-05-20 | Disposition: A | Discharge status: Home / Self Care | Payer: Medicare Other | Source: Ambulatory Visit | Attending: Radiation Oncology | Admitting: Radiation Oncology

## 2015-05-20 ENCOUNTER — Ambulatory Visit: Payer: Medicare Other

## 2015-05-20 ENCOUNTER — Encounter: Payer: Self-pay | Admitting: Radiation Oncology

## 2015-05-20 DIAGNOSIS — Z510 Encounter for antineoplastic radiation therapy: Secondary | ICD-10-CM | POA: Diagnosis not present

## 2015-05-21 ENCOUNTER — Other Ambulatory Visit (HOSPITAL_BASED_OUTPATIENT_CLINIC_OR_DEPARTMENT_OTHER): Payer: Medicare Other

## 2015-05-21 ENCOUNTER — Ambulatory Visit (HOSPITAL_BASED_OUTPATIENT_CLINIC_OR_DEPARTMENT_OTHER): Payer: Medicare Other | Admitting: Nurse Practitioner

## 2015-05-21 ENCOUNTER — Ambulatory Visit (HOSPITAL_BASED_OUTPATIENT_CLINIC_OR_DEPARTMENT_OTHER): Payer: Medicare Other

## 2015-05-21 ENCOUNTER — Ambulatory Visit: Payer: Medicare Other

## 2015-05-21 ENCOUNTER — Other Ambulatory Visit: Payer: Self-pay | Admitting: Nurse Practitioner

## 2015-05-21 ENCOUNTER — Telehealth: Payer: Self-pay | Admitting: Oncology

## 2015-05-21 VITALS — BP 123/71 | HR 119 | Resp 18

## 2015-05-21 VITALS — BP 136/89 | HR 122 | Temp 97.7°F | Resp 17 | Ht 71.0 in | Wt 161.2 lb

## 2015-05-21 DIAGNOSIS — Z95828 Presence of other vascular implants and grafts: Secondary | ICD-10-CM

## 2015-05-21 DIAGNOSIS — C160 Malignant neoplasm of cardia: Secondary | ICD-10-CM | POA: Diagnosis present

## 2015-05-21 DIAGNOSIS — E860 Dehydration: Secondary | ICD-10-CM | POA: Diagnosis present

## 2015-05-21 DIAGNOSIS — C169 Malignant neoplasm of stomach, unspecified: Secondary | ICD-10-CM

## 2015-05-21 LAB — CBC WITH DIFFERENTIAL/PLATELET
BASO%: 0.4 % (ref 0.0–2.0)
BASOS ABS: 0 10*3/uL (ref 0.0–0.1)
EOS%: 0 % (ref 0.0–7.0)
Eosinophils Absolute: 0 10*3/uL (ref 0.0–0.5)
HCT: 26.5 % — ABNORMAL LOW (ref 38.4–49.9)
HEMOGLOBIN: 8.8 g/dL — AB (ref 13.0–17.1)
LYMPH%: 10.5 % — ABNORMAL LOW (ref 14.0–49.0)
MCH: 28.2 pg (ref 27.2–33.4)
MCHC: 33.2 g/dL (ref 32.0–36.0)
MCV: 84.9 fL (ref 79.3–98.0)
MONO#: 0.4 10*3/uL (ref 0.1–0.9)
MONO%: 18.4 % — AB (ref 0.0–14.0)
NEUT#: 1.6 10*3/uL (ref 1.5–6.5)
NEUT%: 70.7 % (ref 39.0–75.0)
Platelets: 341 10*3/uL (ref 140–400)
RBC: 3.12 10*6/uL — ABNORMAL LOW (ref 4.20–5.82)
RDW: 17.1 % — AB (ref 11.0–14.6)
WBC: 2.3 10*3/uL — ABNORMAL LOW (ref 4.0–10.3)
lymph#: 0.2 10*3/uL — ABNORMAL LOW (ref 0.9–3.3)

## 2015-05-21 LAB — COMPREHENSIVE METABOLIC PANEL (CC13)
ALT: 15 U/L (ref 0–55)
AST: 23 U/L (ref 5–34)
Albumin: 2.5 g/dL — ABNORMAL LOW (ref 3.5–5.0)
Alkaline Phosphatase: 75 U/L (ref 40–150)
Anion Gap: 8 mEq/L (ref 3–11)
BUN: 19.1 mg/dL (ref 7.0–26.0)
CHLORIDE: 113 meq/L — AB (ref 98–109)
CO2: 23 mEq/L (ref 22–29)
CREATININE: 1.3 mg/dL (ref 0.7–1.3)
Calcium: 8.8 mg/dL (ref 8.4–10.4)
EGFR: 61 mL/min/{1.73_m2} — ABNORMAL LOW (ref >90)
GLUCOSE: 104 mg/dL (ref 70–140)
POTASSIUM: 4.9 meq/L (ref 3.5–5.1)
SODIUM: 143 meq/L (ref 136–145)
Total Bilirubin: <0.3 mg/dL (ref 0.20–1.20)
Total Protein: 5.7 g/dL — ABNORMAL LOW (ref 6.4–8.3)

## 2015-05-21 LAB — TECHNOLOGIST REVIEW

## 2015-05-21 MED ORDER — SODIUM CHLORIDE 0.9 % IV SOLN
INTRAVENOUS | Status: DC
  Administered 2015-05-21: 15:00:00 via INTRAVENOUS

## 2015-05-21 MED ORDER — SODIUM CHLORIDE 0.9 % IJ SOLN
10.0000 mL | INTRAMUSCULAR | Status: DC | PRN
  Administered 2015-05-21: 10 mL via INTRAVENOUS
  Filled 2015-05-21: qty 10

## 2015-05-21 MED ORDER — HEPARIN SOD (PORK) LOCK FLUSH 100 UNIT/ML IV SOLN
500.0000 [IU] | Freq: Once | INTRAVENOUS | Status: AC
  Administered 2015-05-21: 500 [IU] via INTRAVENOUS
  Filled 2015-05-21: qty 5

## 2015-05-21 NOTE — Patient Instructions (Signed)
Implanted Port Home Guide  An implanted port is a type of central line that is placed under the skin. Central lines are used to provide IV access when treatment or nutrition needs to be given through a person's veins. Implanted ports are used for long-term IV access. An implanted port may be placed because:    You need IV medicine that would be irritating to the small veins in your hands or arms.    You need long-term IV medicines, such as antibiotics.    You need IV nutrition for a long period.    You need frequent blood draws for lab tests.    You need dialysis.   Implanted ports are usually placed in the chest area, but they can also be placed in the upper arm, the abdomen, or the leg. An implanted port has two main parts:    Reservoir. The reservoir is round and will appear as a small, raised area under your skin. The reservoir is the part where a needle is inserted to give medicines or draw blood.    Catheter. The catheter is a thin, flexible tube that extends from the reservoir. The catheter is placed into a large vein. Medicine that is inserted into the reservoir goes into the catheter and then into the vein.   HOW WILL I CARE FOR MY INCISION SITE?  Do not get the incision site wet. Bathe or shower as directed by your health care Benjamin Lawson.   HOW IS MY PORT ACCESSED?  Special steps must be taken to access the port:    Before the port is accessed, a numbing cream can be placed on the skin. This helps numb the skin over the port site.    Your health care Benjamin Lawson uses a sterile technique to access the port.   Your health care Benjamin Lawson must put on a mask and sterile gloves.   The skin over your port is cleaned carefully with an antiseptic and allowed to dry.   The port is gently pinched between sterile gloves, and a needle is inserted into the port.   Only "non-coring" port needles should be used to access the port. Once the port is accessed, a blood return should be checked. This helps  ensure that the port is in the vein and is not clogged.    If your port needs to remain accessed for a constant infusion, a clear (transparent) bandage will be placed over the needle site. The bandage and needle will need to be changed every week, or as directed by your health care Benjamin Lawson.    Keep the bandage covering the needle clean and dry. Do not get it wet. Follow your health care Benjamin Lawson's instructions on how to take a shower or bath while the port is accessed.    If your port does not need to stay accessed, no bandage is needed over the port.   WHAT IS FLUSHING?  Flushing helps keep the port from getting clogged. Follow your health care Benjamin Lawson's instructions on how and when to flush the port. Ports are usually flushed with saline solution or a medicine called heparin. The need for flushing will depend on how the port is used.    If the port is used for intermittent medicines or blood draws, the port will need to be flushed:    After medicines have been given.    After blood has been drawn.    As part of routine maintenance.    If a constant infusion is   running, the port may not need to be flushed.   HOW LONG WILL MY PORT STAY IMPLANTED?  The port can stay in for as long as your health care Benjamin Lawson thinks it is needed. When it is time for the port to come out, surgery will be done to remove it. The procedure is similar to the one performed when the port was put in.   WHEN SHOULD I SEEK IMMEDIATE MEDICAL CARE?  When you have an implanted port, you should seek immediate medical care if:    You notice a bad smell coming from the incision site.    You have swelling, redness, or drainage at the incision site.    You have more swelling or pain at the port site or the surrounding area.    You have a fever that is not controlled with medicine.  Document Released: 08/16/2005 Document Revised: 06/06/2013 Document Reviewed: 04/23/2013  Exit[employer] Patient Information 2015 [employer]. This  information is not intended to replace advice given to you by your health care Benjamin Lawson. Make sure you discuss any questions you have with your health care Benjamin Lawson.

## 2015-05-21 NOTE — Telephone Encounter (Signed)
Pt will get appt in tx

## 2015-05-21 NOTE — Progress Notes (Addendum)
  [facility] OFFICE PROGRESS NOTE   Diagnosis:  Gastric cancer  INTERVAL HISTORY:   Benjamin Lawson returns as scheduled. He did not receive week 5 Taxol/carboplatin last week due to neutropenia. He denies nausea/vomiting. No mouth sores. He began having diarrhea last week. He was started on Questran and has noted improvement. Appetite continues to be marginal.  Objective:  Vital signs in last 24 hours:  Blood pressure 136/89, pulse 122, temperature 97.7 F (36.5 C), temperature source Oral, resp. rate 17, height 5\' 11"  (1.803 m), weight 161 lb 3.2 oz (73.12 kg), SpO2 94 %. repeat heart rate 116    HEENT: No thrush or ulcers. Mucous membranes appear mildly dry. Resp: Lungs clear bilaterally. Cardio: Regular, tachycardic. GI: Abdomen soft and nontender. No organomegaly. Vascular: Trace lower leg edema bilaterally. Skin: Mild decrease in skin turgor. Port-A-Cath without erythema.    Lab Results:  Lab Results  Component Value Date   WBC 2.3* 05/21/2015   HGB 8.8* 05/21/2015   HCT 26.5* 05/21/2015   MCV 84.9 05/21/2015   PLT 341 05/21/2015   NEUTROABS 1.6 05/21/2015    Imaging:  No results found.  Medications: I have reviewed the patient's current medications.  Assessment/Plan: 1. Gastric cancer; EGD on 03/28/2015 which showed a near circumferential mass in the cardia. Biopsy showed adenocarcinoma. Staging CT scans showed diffuse wall thickening of the stomach; small nodular areas of soft tissue seen adjacent to the stomach; small hypoattenuating liver lesions.   Initiation of radiation 04/09/2015.   Initiation of weekly Taxol/carboplatin 04/17/2015.   Week 2 Taxol/carboplatin 04/24/2015.   Week 3 Taxol/carboplatin held due to neutropenia.   Week 4 Taxol/carboplatin 05/08/2015.   Week 5 held on 05/15/2015 due to neutropenia.   Radiation completed 05/20/2015. 2. GI bleeding secondary to #1 3. Anemia secondary to GI bleeding 4. Renal  insufficiency   Disposition: Benjamin Lawson completed the course of radiation on 05/20/2015. Dr. Benay Spice does not recommend further chemotherapy. His performance status is borderline. He appears mildly dehydrated. We will give a liter of IV fluids in the office today. He will continue working with physical therapy and trying to improve nutritional status. He will return for a follow-up visit in 2 weeks.  Patient seen with Dr. Benay Spice.  Ned Card ANP/GNP-BC   05/21/2015  2:09 PM  This was a shared visit with Ned Card. Benjamin Lawson was interviewed and examined. He completed the course of palliative radiation. His performance status is not adequate to receive further chemotherapy. We discussed the situation with his daughter. He will work with physical therapy and on improving his diet. We will see him for a follow-up visit in approximately 2 weeks.  Julieanne Manson, M.D.

## 2015-05-21 NOTE — Patient Instructions (Signed)
Dehydration, Adult Dehydration is when you lose more fluids from the body than you take in. Vital organs like the kidneys, brain, and heart cannot function without a proper amount of fluids and salt. Any loss of fluids from the body can cause dehydration.  CAUSES   Vomiting.  Diarrhea.  Excessive sweating.  Excessive urine output.  Fever. SYMPTOMS  Mild dehydration  Thirst.  Dry lips.  Slightly dry mouth. Moderate dehydration  Very dry mouth.  Sunken eyes.  Skin does not bounce back quickly when lightly pinched and released.  Dark urine and decreased urine production.  Decreased tear production.  Headache. Severe dehydration  Very dry mouth.  Extreme thirst.  Rapid, weak pulse (more than 100 beats per minute at rest).  Cold hands and feet.  Not able to sweat in spite of heat and temperature.  Rapid breathing.  Blue lips.  Confusion and lethargy.  Difficulty being awakened.  Minimal urine production.  No tears. DIAGNOSIS  Your caregiver will diagnose dehydration based on your symptoms and your exam. Blood and urine tests will help confirm the diagnosis. The diagnostic evaluation should also identify the cause of dehydration. TREATMENT  Treatment of mild or moderate dehydration can often be done at home by increasing the amount of fluids that you drink. It is best to drink small amounts of fluid more often. Drinking too much at one time can make vomiting worse. Refer to the home care instructions below. Severe dehydration needs to be treated at the hospital where you will probably be given intravenous (IV) fluids that contain water and electrolytes. HOME CARE INSTRUCTIONS   Ask your caregiver about specific rehydration instructions.  Drink enough fluids to keep your urine clear or pale yellow.  Drink small amounts frequently if you have nausea and vomiting.  Eat as you normally do.  Avoid:  Foods or drinks high in sugar.  Carbonated  drinks.  Juice.  Extremely hot or cold fluids.  Drinks with caffeine.  Fatty, greasy foods.  Alcohol.  Tobacco.  Overeating.  Gelatin desserts.  Wash your hands well to avoid spreading bacteria and viruses.  Only take over-the-counter or prescription medicines for pain, discomfort, or fever as directed by your caregiver.  Ask your caregiver if you should continue all prescribed and over-the-counter medicines.  Keep all follow-up appointments with your caregiver. SEEK MEDICAL CARE IF:  You have abdominal pain and it increases or stays in one area (localizes).  You have a rash, stiff neck, or severe headache.  You are irritable, sleepy, or difficult to awaken.  You are weak, dizzy, or extremely thirsty. SEEK IMMEDIATE MEDICAL CARE IF:   You are unable to keep fluids down or you get worse despite treatment.  You have frequent episodes of vomiting or diarrhea.  You have blood or green matter (bile) in your vomit.  You have blood in your stool or your stool looks black and tarry.  You have not urinated in 6 to 8 hours, or you have only urinated a small amount of very dark urine.  You have a fever.  You faint. MAKE SURE YOU:   Understand these instructions.  Will watch your condition.  Will get help right away if you are not doing well or get worse. Document Released: 08/16/2005 Document Revised: 11/08/2011 Document Reviewed: 04/05/2011 Exit[employer] Patient Information 2015 [employer]. This information is not intended to replace advice given to you by your health care provider. Make sure you discuss any questions you have with your health care   provider.  

## 2015-05-23 ENCOUNTER — Emergency Department (HOSPITAL_COMMUNITY): Payer: Medicare Other

## 2015-05-23 ENCOUNTER — Encounter (HOSPITAL_COMMUNITY): Payer: Self-pay | Admitting: Emergency Medicine

## 2015-05-23 ENCOUNTER — Other Ambulatory Visit: Payer: Self-pay

## 2015-05-23 ENCOUNTER — Inpatient Hospital Stay (HOSPITAL_COMMUNITY)
Admission: EM | Admit: 2015-05-23 | Discharge: 2015-05-29 | DRG: 167 | Disposition: A | Discharge status: Skilled Nursing Facility | Payer: Medicare Other | Attending: Internal Medicine | Admitting: Internal Medicine

## 2015-05-23 DIAGNOSIS — R0602 Shortness of breath: Secondary | ICD-10-CM | POA: Diagnosis not present

## 2015-05-23 DIAGNOSIS — I2699 Other pulmonary embolism without acute cor pulmonale: Principal | ICD-10-CM | POA: Diagnosis present

## 2015-05-23 DIAGNOSIS — H9192 Unspecified hearing loss, left ear: Secondary | ICD-10-CM | POA: Diagnosis present

## 2015-05-23 DIAGNOSIS — R778 Other specified abnormalities of plasma proteins: Secondary | ICD-10-CM

## 2015-05-23 DIAGNOSIS — L8990 Pressure ulcer of unspecified site, unspecified stage: Secondary | ICD-10-CM | POA: Diagnosis present

## 2015-05-23 DIAGNOSIS — M7989 Other specified soft tissue disorders: Secondary | ICD-10-CM | POA: Diagnosis present

## 2015-05-23 DIAGNOSIS — N182 Chronic kidney disease, stage 2 (mild): Secondary | ICD-10-CM | POA: Diagnosis present

## 2015-05-23 DIAGNOSIS — I82492 Acute embolism and thrombosis of other specified deep vein of left lower extremity: Secondary | ICD-10-CM | POA: Diagnosis present

## 2015-05-23 DIAGNOSIS — I739 Peripheral vascular disease, unspecified: Secondary | ICD-10-CM | POA: Diagnosis present

## 2015-05-23 DIAGNOSIS — Z515 Encounter for palliative care: Secondary | ICD-10-CM

## 2015-05-23 DIAGNOSIS — Z79899 Other long term (current) drug therapy: Secondary | ICD-10-CM

## 2015-05-23 DIAGNOSIS — Z8546 Personal history of malignant neoplasm of prostate: Secondary | ICD-10-CM

## 2015-05-23 DIAGNOSIS — N390 Urinary tract infection, site not specified: Secondary | ICD-10-CM | POA: Diagnosis present

## 2015-05-23 DIAGNOSIS — Z87891 Personal history of nicotine dependence: Secondary | ICD-10-CM

## 2015-05-23 DIAGNOSIS — Z9221 Personal history of antineoplastic chemotherapy: Secondary | ICD-10-CM

## 2015-05-23 DIAGNOSIS — R7989 Other specified abnormal findings of blood chemistry: Secondary | ICD-10-CM

## 2015-05-23 DIAGNOSIS — R0902 Hypoxemia: Secondary | ICD-10-CM | POA: Diagnosis present

## 2015-05-23 DIAGNOSIS — Z7189 Other specified counseling: Secondary | ICD-10-CM

## 2015-05-23 DIAGNOSIS — I5189 Other ill-defined heart diseases: Secondary | ICD-10-CM | POA: Diagnosis present

## 2015-05-23 DIAGNOSIS — E876 Hypokalemia: Secondary | ICD-10-CM | POA: Diagnosis not present

## 2015-05-23 DIAGNOSIS — B9620 Unspecified Escherichia coli [E. coli] as the cause of diseases classified elsewhere: Secondary | ICD-10-CM | POA: Diagnosis present

## 2015-05-23 DIAGNOSIS — Z66 Do not resuscitate: Secondary | ICD-10-CM | POA: Diagnosis not present

## 2015-05-23 DIAGNOSIS — C160 Malignant neoplasm of cardia: Secondary | ICD-10-CM | POA: Diagnosis present

## 2015-05-23 DIAGNOSIS — R000 Tachycardia, unspecified: Secondary | ICD-10-CM | POA: Diagnosis present

## 2015-05-23 DIAGNOSIS — E780 Pure hypercholesterolemia: Secondary | ICD-10-CM | POA: Diagnosis present

## 2015-05-23 DIAGNOSIS — I272 Other secondary pulmonary hypertension: Secondary | ICD-10-CM | POA: Diagnosis present

## 2015-05-23 DIAGNOSIS — R748 Abnormal levels of other serum enzymes: Secondary | ICD-10-CM | POA: Diagnosis present

## 2015-05-23 DIAGNOSIS — R531 Weakness: Secondary | ICD-10-CM | POA: Insufficient documentation

## 2015-05-23 DIAGNOSIS — Z923 Personal history of irradiation: Secondary | ICD-10-CM

## 2015-05-23 DIAGNOSIS — I129 Hypertensive chronic kidney disease with stage 1 through stage 4 chronic kidney disease, or unspecified chronic kidney disease: Secondary | ICD-10-CM | POA: Diagnosis present

## 2015-05-23 LAB — URINE MICROSCOPIC-ADD ON

## 2015-05-23 LAB — URINALYSIS, ROUTINE W REFLEX MICROSCOPIC
Glucose, UA: NEGATIVE mg/dL
Ketones, ur: 15 mg/dL — AB
NITRITE: POSITIVE — AB
Protein, ur: 30 mg/dL — AB
SPECIFIC GRAVITY, URINE: 1.019 (ref 1.005–1.030)
UROBILINOGEN UA: 0.2 mg/dL (ref 0.0–1.0)
pH: 5 (ref 5.0–8.0)

## 2015-05-23 LAB — BASIC METABOLIC PANEL
ANION GAP: 11 (ref 5–15)
BUN: 20 mg/dL (ref 6–20)
CHLORIDE: 110 mmol/L (ref 101–111)
CO2: 17 mmol/L — AB (ref 22–32)
CREATININE: 1.58 mg/dL — AB (ref 0.61–1.24)
Calcium: 8.5 mg/dL — ABNORMAL LOW (ref 8.9–10.3)
GFR calc non Af Amer: 40 mL/min — ABNORMAL LOW (ref >60)
GFR, EST AFRICAN AMERICAN: 46 mL/min — AB (ref >60)
Glucose, Bld: 109 mg/dL — ABNORMAL HIGH (ref 65–99)
Potassium: 4.9 mmol/L (ref 3.5–5.1)
Sodium: 138 mmol/L (ref 135–145)

## 2015-05-23 LAB — CBC WITH DIFFERENTIAL/PLATELET
BASOS PCT: 1 %
Basophils Absolute: 0 10*3/uL (ref 0.0–0.1)
Eosinophils Absolute: 0 10*3/uL (ref 0.0–0.7)
Eosinophils Relative: 0 %
HEMATOCRIT: 27.2 % — AB (ref 39.0–52.0)
HEMOGLOBIN: 8.6 g/dL — AB (ref 13.0–17.0)
LYMPHS ABS: 0.3 10*3/uL — AB (ref 0.7–4.0)
LYMPHS PCT: 10 %
MCH: 27.2 pg (ref 26.0–34.0)
MCHC: 31.6 g/dL (ref 30.0–36.0)
MCV: 86.1 fL (ref 78.0–100.0)
MONOS PCT: 18 %
Monocytes Absolute: 0.6 10*3/uL (ref 0.1–1.0)
NEUTROS ABS: 2.5 10*3/uL (ref 1.7–7.7)
NEUTROS PCT: 71 %
Platelets: 307 10*3/uL (ref 150–400)
RBC: 3.16 MIL/uL — ABNORMAL LOW (ref 4.22–5.81)
RDW: 17.4 % — ABNORMAL HIGH (ref 11.5–15.5)
WBC: 3.5 10*3/uL — ABNORMAL LOW (ref 4.0–10.5)

## 2015-05-23 LAB — BRAIN NATRIURETIC PEPTIDE: B Natriuretic Peptide: 120.4 pg/mL — ABNORMAL HIGH (ref 0.0–100.0)

## 2015-05-23 LAB — I-STAT TROPONIN, ED: TROPONIN I, POC: 0.23 ng/mL — AB (ref 0.00–0.08)

## 2015-05-23 LAB — LACTIC ACID, PLASMA: LACTIC ACID, VENOUS: 2.6 mmol/L — AB (ref 0.5–2.0)

## 2015-05-23 LAB — TROPONIN I: TROPONIN I: 0.24 ng/mL — AB (ref <0.031)

## 2015-05-23 MED ORDER — SODIUM CHLORIDE 0.9 % IV BOLUS (SEPSIS)
1000.0000 mL | Freq: Once | INTRAVENOUS | Status: AC
  Administered 2015-05-23: 1000 mL via INTRAVENOUS

## 2015-05-23 MED ORDER — IOHEXOL 350 MG/ML SOLN
80.0000 mL | Freq: Once | INTRAVENOUS | Status: AC | PRN
  Administered 2015-05-23: 80 mL via INTRAVENOUS

## 2015-05-23 NOTE — ED Notes (Signed)
Patient transported to CT scan . 

## 2015-05-23 NOTE — ED Notes (Signed)
EDP notified on elevated lactic acid result.  

## 2015-05-23 NOTE — ED Notes (Signed)
Pt. arrived with EMS from [facility] reports worsening SOB , generalized weakness for several days . Denies chest pain /fever or chills. History of gastric cancer has under gone chemotherapy and currently receiving physical therapy at the nursing home .

## 2015-05-23 NOTE — ED Provider Notes (Signed)
CSN: [id number]     Arrival date & time 05/23/15  2014 History   First MD Initiated Contact with Patient 05/23/15 2019     Chief Complaint  Patient presents with  . Shortness of Breath    (Consider location/radiation/quality/duration/timing/severity/associated sxs/prior Treatment) Patient is a 81 y.o. male presenting with shortness of breath. The history is provided by the patient and medical records. No language interpreter was used.  Shortness of Breath Severity:  Severe Onset quality:  Gradual Duration:  3 days Timing:  Constant Progression:  Waxing and waning Chronicity:  New Context comment:  Cancer Relieved by:  Oxygen Worsened by:  Nothing tried Ineffective treatments:  None tried Associated symptoms: no abdominal pain, no chest pain, no cough, no diaphoresis, no fever, no headaches, no hemoptysis, no neck pain, no rash, no sore throat, no sputum production, no syncope, no vomiting and no wheezing   Risk factors: hx of cancer   Risk factors: no hx of PE/DVT     Past Medical History  Diagnosis Date  . Hypertension   . Hypercholesterolemia   . Chronic kidney disease (CKD), stage II (mild)   . Deafness in left ear   . Peripheral vascular disease   . Prostate cancer    Past Surgical History  Procedure Laterality Date  . Multiple tooth extractions  01/2015    "all my lower teeth"  . Transurethral resection of prostate  1990's  . Esophagogastroduodenoscopy N/A 03/28/2015    Procedure: ESOPHAGOGASTRODUODENOSCOPY (EGD);  Surgeon: Richmond Campbell, MD;  [facility];  Service: Endoscopy;  Laterality: N/A;   No family history on file. Social History  Substance Use Topics  . Smoking status: Former Smoker -- 0.50 packs/day for 5 years    Types: Cigarettes  . Smokeless tobacco: Never Used     Comment: "stopped smoking in the 1960's  . Alcohol Use: 1.2 oz/week    2 Shots of liquor per week     Comment: 03/27/2015 "6 pack of crown royals/month"    Review of Systems   Constitutional: Negative for fever, chills, diaphoresis and appetite change.  HENT: Negative for congestion and sore throat.   Respiratory: Positive for shortness of breath. Negative for apnea, cough, hemoptysis, sputum production, chest tightness, wheezing and stridor.   Cardiovascular: Negative for chest pain, palpitations, leg swelling and syncope.  Gastrointestinal: Negative for vomiting and abdominal pain.  Genitourinary: Negative for dysuria.  Musculoskeletal: Negative for back pain, neck pain and neck stiffness.  Skin: Negative for rash and wound.  Neurological: Negative for headaches.  Psychiatric/Behavioral: Negative for agitation.  All other systems reviewed and are negative.     Allergies  Review of patient's allergies indicates no known allergies.  Home Medications   Prior to Admission medications   Medication Sig Start Date End Date Taking? Authorizing Provider  atorvastatin (LIPITOR) 20 MG tablet Take 20 mg by mouth daily.    Historical Provider, MD  ENSURE PLUS (ENSURE PLUS) LIQD Take 237 mLs by mouth 2 (two) times daily between meals.    Historical Provider, MD  lidocaine-prilocaine (EMLA) cream APPLY 5 ML TOPICALLY TO PORT-A-CATH 1 1/2 HOURS BEFORE CHEMOTHERAPY, AS NEEDED 04/30/15   Historical Provider, MD  Lidocaine-Prilocaine, Bulk, 2.5-2.5 % CREA Apply 5 mLs topically as needed. To port-a-cath 1.5 hours before chemotherapy 04/30/15   Ladell Pier, MD  metoprolol succinate (TOPROL-XL) 50 MG 24 hr tablet Take 50 mg by mouth daily. Take with or immediately following a meal.    Historical Provider, MD  MICARDIS HCT 80-12.5 MG per tablet Take 1 capsule by mouth daily. 04/23/15   Historical Provider, MD  pantoprazole (PROTONIX) 40 MG tablet Take 1 tablet (40 mg total) by mouth 2 (two) times daily before a meal. 04/03/15   Reyne Dumas, MD  sennosides-docusate sodium (SENOKOT-S) 8.6-50 MG tablet Take 1 tablet by mouth daily.    Historical Provider, MD  tolterodine (DETROL  LA) 4 MG 24 hr capsule Take 4 mg by mouth daily.    Historical Provider, MD   There were no vitals taken for this visit. Physical Exam  Constitutional: He is oriented to person, place, and time. He appears well-developed and well-nourished. No distress.  HENT:  Head: Normocephalic and atraumatic.  Mouth/Throat: No oropharyngeal exudate.  Eyes: Conjunctivae and EOM are normal. Pupils are equal, round, and reactive to light.  Neck: Normal range of motion.  Cardiovascular: Regular rhythm, normal heart sounds and intact distal pulses.  Tachycardia present.   No murmur heard. Pulmonary/Chest: No stridor. He has no wheezes. He has no rales. He exhibits no tenderness.  Abdominal: Soft. There is no tenderness.  Musculoskeletal: He exhibits no edema.  Neurological: He is alert and oriented to person, place, and time. He exhibits normal muscle tone.  Skin: Skin is warm. He is not diaphoretic. No erythema. No pallor.  Psychiatric: He has a normal mood and affect.  Nursing note and vitals reviewed.   ED Course  Procedures (including critical care time) Labs Review Labs Reviewed  CBC WITH DIFFERENTIAL/PLATELET - Abnormal; Notable for the following:    WBC 3.5 (*)    RBC 3.16 (*)    Hemoglobin 8.6 (*)    HCT 27.2 (*)    RDW 17.4 (*)    Lymphs Abs 0.3 (*)    All other components within normal limits  BASIC METABOLIC PANEL - Abnormal; Notable for the following:    CO2 17 (*)    Glucose, Bld 109 (*)    Creatinine, Ser 1.58 (*)    Calcium 8.5 (*)    GFR calc non Af Amer 40 (*)    GFR calc Af Amer 46 (*)    All other components within normal limits  URINALYSIS, ROUTINE W REFLEX MICROSCOPIC (NOT AT [facility]) - Abnormal; Notable for the following:    APPearance CLOUDY (*)    Hgb urine dipstick MODERATE (*)    Bilirubin Urine SMALL (*)    Ketones, ur 15 (*)    Protein, ur 30 (*)    Nitrite POSITIVE (*)    Leukocytes, UA MODERATE (*)    All other components within normal limits  LACTIC ACID,  PLASMA - Abnormal; Notable for the following:    Lactic Acid, Venous 2.6 (*)    All other components within normal limits  TROPONIN I - Abnormal; Notable for the following:    Troponin I 0.24 (*)    All other components within normal limits  BRAIN NATRIURETIC PEPTIDE - Abnormal; Notable for the following:    B Natriuretic Peptide 120.4 (*)    All other components within normal limits  URINE MICROSCOPIC-ADD ON - Abnormal; Notable for the following:    Squamous Epithelial / LPF FEW (*)    Bacteria, UA MANY (*)    Casts HYALINE CASTS (*)    All other components within normal limits  I-STAT TROPOININ, ED - Abnormal; Notable for the following:    Troponin i, poc 0.23 (*)    All other components within normal limits  I-STAT TROPOININ, ED -  Abnormal; Notable for the following:    Troponin i, poc 0.45 (*)    All other components within normal limits  URINE CULTURE  LACTIC ACID, PLASMA  HEPARIN LEVEL (UNFRACTIONATED)  CBC    Imaging Review Dg Chest 2 View  05/23/2015   CLINICAL DATA:  Worsening shortness of breath and generalize weakness for several days. History of gastric cancer post chemotherapy.  EXAM: CHEST  2 VIEW  COMPARISON:  05/08/2015  FINDINGS: Normal heart size and pulmonary vascularity. Chronic elevation of the left hemidiaphragm with prominent gas-filled dilated colon. Gaseous distention of the colon is similar to prior study and suggests colonic ileus. Atelectasis in the left lung base. Right lung appears clear and expanded. Power port type central venous catheter with tip over the low SVC region. Calcified and tortuous aorta. Degenerative changes in the spine and shoulders.  IMPRESSION: Chronic elevation of the left hemidiaphragm with atelectasis in the left lung base. No evidence of active pulmonary disease. Again demonstrated is prominent gaseous distention of the colon suggesting colonic ileus.   Electronically Signed   By: Lucienne Capers M.D.   On: 05/23/2015 22:13   Ct  Angio Chest Pe W/cm &/or Wo Cm  05/23/2015   CLINICAL DATA:  Extreme shortness of breath in a patient who is had recent pulmonary embolus. Hypoxia, tachycardia, shortness of breath, history of gastric cancer and prostate cancer.  EXAM: CT ANGIOGRAPHY CHEST WITH CONTRAST  TECHNIQUE: Multidetector CT imaging of the chest was performed using the standard protocol during bolus administration of intravenous contrast. Multiplanar CT image reconstructions and MIPs were obtained to evaluate the vascular anatomy.  CONTRAST:  57mL OMNIPAQUE IOHEXOL 350 MG/ML SOLN  COMPARISON:  None.  FINDINGS: Technically adequate study with good opacification of the central and segmental pulmonary arteries. Multiple filling defects are demonstrated in the distal right and left main pulmonary arteries with extension into bilateral upper and lower lobe segmental branches. RV to LV ratio is 1.4 suggesting possible right heart strain.  Normal heart size. Normal caliber thoracic aorta. No evidence of aortic dissection. Calcification in the aorta and coronary arteries. Great vessel origins are patent. Esophagus is decompressed. Prominent lymph nodes in the right paratracheal and pretracheal region without pathologic enlargement.  Elevation of the left hemidiaphragm. Small left pleural effusion with atelectasis or consolidation in the left lung base. Emphysematous changes in the lungs. Scarring in the lung apices. No pneumothorax.  Included portions of the upper lungs demonstrate gastric wall thickening in a decompressed stomach. This may correlate with patient's known history of gastric carcinoma. Spleen is atrophic. Colon is distended. Degenerative changes in the spine. No destructive bone lesions.  Review of the MIP images confirms the above findings.  IMPRESSION: Examination is positive for multiple bilateral pulmonary emboli. Increased RV to LV ratio suggest possibility of right heart strain. Elevation of left hemidiaphragm with small left  pleural effusion and atelectasis or consolidation in the left lung base. Gaseous distention of the colon. Gastric wall thickening may correspond to history of gastric carcinoma.  These results were called by telephone at the time of interpretation on 05/23/2015 at 11:50 pm to Dr. Antony Blackbird , who verbally acknowledged these results.   Electronically Signed   By: Lucienne Capers M.D.   On: 05/23/2015 23:53   I have personally reviewed and evaluated these images and lab results as part of my medical decision-making.   EKG Interpretation None      MDM   STRIDER VALLANCE is a 81 y.o.  male with a past medical history significant for hypertension, chronic kidney disease, gastric cancer status post radiation therapy and chemotherapy who presents with shortness of breath. The patient reports that for the last several days, he has had progressively worsening shortness of breath. The patient was sent from his rehabilitation facility for hypertension. It is unknown how low his blood pressure was measured today however, the patient was not hypotensive during his arrival in stating the emergency department. The patient reports that he has needed oxygen which helped his shortness of breath. The patient denies any chest pain, palpitations, hemoptysis or leg swelling. The patient denies fevers chills nausea, vomiting, constipation, diarrhea. The patient does report some dysuria for the last week.  On exam, the patient's lungs were clear to auscultation bilaterally however the patient was tachycardic on arrival. The patient's legs did not have significant edema.  Given the patient's diagnosis of cancer as well as his oxygen requirement and tachycardia, there is clinical concern for pulmonary embolism. The patient had a PE study which revealed   large bilateral pulmonary emboli. The patient's initial troponin was positive. The patient's EKG revealed a sinus tachycardia. The patient's urinalysis did reveal a UTI and  the patient was given a dose of Rocephin.  The critical care team was called and they recommended heparinizing the patient.  Of note, the patient was admitted in August for GI hemorrhage and hypotension. A discussion was held with the patient on the risks of anticoagulation given his history of GI bleed however, the patient and his daughter agreed that worsening pulmonary embolism required treatment.   The will be admitted after critical care evaluation to determine his level of care needed for admission.  This patient was seen with Dr. Kathrynn Humble, emergency medicine attending.  Final diagnoses:  UTI (lower urinary tract infection)  Acute massive pulmonary embolism  Elevated troponin          Antony Blackbird, MD 05/24/15 [id number]  Varney Biles, MD 06/02/15 4401  Varney Biles, MD 06/02/15 0272

## 2015-05-23 NOTE — ED Notes (Signed)
Patient transported to X-ray 

## 2015-05-24 ENCOUNTER — Inpatient Hospital Stay (HOSPITAL_COMMUNITY): Payer: Medicare Other

## 2015-05-24 DIAGNOSIS — H9192 Unspecified hearing loss, left ear: Secondary | ICD-10-CM | POA: Diagnosis present

## 2015-05-24 DIAGNOSIS — D500 Iron deficiency anemia secondary to blood loss (chronic): Secondary | ICD-10-CM | POA: Diagnosis not present

## 2015-05-24 DIAGNOSIS — I739 Peripheral vascular disease, unspecified: Secondary | ICD-10-CM | POA: Diagnosis present

## 2015-05-24 DIAGNOSIS — M7989 Other specified soft tissue disorders: Secondary | ICD-10-CM | POA: Diagnosis not present

## 2015-05-24 DIAGNOSIS — Z515 Encounter for palliative care: Secondary | ICD-10-CM | POA: Diagnosis not present

## 2015-05-24 DIAGNOSIS — R531 Weakness: Secondary | ICD-10-CM | POA: Diagnosis not present

## 2015-05-24 DIAGNOSIS — I272 Other secondary pulmonary hypertension: Secondary | ICD-10-CM | POA: Diagnosis present

## 2015-05-24 DIAGNOSIS — Z79899 Other long term (current) drug therapy: Secondary | ICD-10-CM | POA: Diagnosis not present

## 2015-05-24 DIAGNOSIS — I129 Hypertensive chronic kidney disease with stage 1 through stage 4 chronic kidney disease, or unspecified chronic kidney disease: Secondary | ICD-10-CM | POA: Diagnosis present

## 2015-05-24 DIAGNOSIS — R000 Tachycardia, unspecified: Secondary | ICD-10-CM | POA: Diagnosis present

## 2015-05-24 DIAGNOSIS — I82492 Acute embolism and thrombosis of other specified deep vein of left lower extremity: Secondary | ICD-10-CM | POA: Diagnosis present

## 2015-05-24 DIAGNOSIS — D6481 Anemia due to antineoplastic chemotherapy: Secondary | ICD-10-CM | POA: Diagnosis not present

## 2015-05-24 DIAGNOSIS — Z66 Do not resuscitate: Secondary | ICD-10-CM | POA: Diagnosis not present

## 2015-05-24 DIAGNOSIS — Z9221 Personal history of antineoplastic chemotherapy: Secondary | ICD-10-CM | POA: Diagnosis not present

## 2015-05-24 DIAGNOSIS — Z7189 Other specified counseling: Secondary | ICD-10-CM | POA: Diagnosis not present

## 2015-05-24 DIAGNOSIS — I2699 Other pulmonary embolism without acute cor pulmonale: Secondary | ICD-10-CM | POA: Diagnosis present

## 2015-05-24 DIAGNOSIS — R0602 Shortness of breath: Secondary | ICD-10-CM | POA: Diagnosis present

## 2015-05-24 DIAGNOSIS — I959 Hypotension, unspecified: Secondary | ICD-10-CM | POA: Diagnosis not present

## 2015-05-24 DIAGNOSIS — C160 Malignant neoplasm of cardia: Secondary | ICD-10-CM | POA: Diagnosis present

## 2015-05-24 DIAGNOSIS — I82402 Acute embolism and thrombosis of unspecified deep veins of left lower extremity: Secondary | ICD-10-CM | POA: Diagnosis not present

## 2015-05-24 DIAGNOSIS — R0902 Hypoxemia: Secondary | ICD-10-CM | POA: Diagnosis present

## 2015-05-24 DIAGNOSIS — Z8546 Personal history of malignant neoplasm of prostate: Secondary | ICD-10-CM | POA: Diagnosis not present

## 2015-05-24 DIAGNOSIS — E876 Hypokalemia: Secondary | ICD-10-CM | POA: Diagnosis not present

## 2015-05-24 DIAGNOSIS — R748 Abnormal levels of other serum enzymes: Secondary | ICD-10-CM | POA: Diagnosis present

## 2015-05-24 DIAGNOSIS — I519 Heart disease, unspecified: Secondary | ICD-10-CM | POA: Diagnosis not present

## 2015-05-24 DIAGNOSIS — L8990 Pressure ulcer of unspecified site, unspecified stage: Secondary | ICD-10-CM | POA: Diagnosis present

## 2015-05-24 DIAGNOSIS — N390 Urinary tract infection, site not specified: Secondary | ICD-10-CM | POA: Diagnosis present

## 2015-05-24 DIAGNOSIS — B9620 Unspecified Escherichia coli [E. coli] as the cause of diseases classified elsewhere: Secondary | ICD-10-CM | POA: Diagnosis not present

## 2015-05-24 DIAGNOSIS — E780 Pure hypercholesterolemia: Secondary | ICD-10-CM | POA: Diagnosis present

## 2015-05-24 DIAGNOSIS — Z923 Personal history of irradiation: Secondary | ICD-10-CM | POA: Diagnosis not present

## 2015-05-24 DIAGNOSIS — N182 Chronic kidney disease, stage 2 (mild): Secondary | ICD-10-CM | POA: Diagnosis present

## 2015-05-24 DIAGNOSIS — Z87891 Personal history of nicotine dependence: Secondary | ICD-10-CM | POA: Diagnosis not present

## 2015-05-24 DIAGNOSIS — N289 Disorder of kidney and ureter, unspecified: Secondary | ICD-10-CM | POA: Diagnosis not present

## 2015-05-24 LAB — CBC
HCT: 24.2 % — ABNORMAL LOW (ref 39.0–52.0)
HEMATOCRIT: 24.7 % — AB (ref 39.0–52.0)
Hemoglobin: 8.2 g/dL — ABNORMAL LOW (ref 13.0–17.0)
Hemoglobin: 7.8 g/dL — ABNORMAL LOW (ref 13.0–17.0)
MCH: 28.6 pg (ref 26.0–34.0)
MCH: 27.6 pg (ref 26.0–34.0)
MCHC: 33.2 g/dL (ref 30.0–36.0)
MCHC: 32.2 g/dL (ref 30.0–36.0)
MCV: 86.1 fL (ref 78.0–100.0)
MCV: 85.5 fL (ref 78.0–100.0)
PLATELETS: 265 10*3/uL (ref 150–400)
PLATELETS: 252 10*3/uL (ref 150–400)
RBC: 2.87 MIL/uL — AB (ref 4.22–5.81)
RBC: 2.83 MIL/uL — AB (ref 4.22–5.81)
RDW: 17.7 % — ABNORMAL HIGH (ref 11.5–15.5)
RDW: 17.7 % — ABNORMAL HIGH (ref 11.5–15.5)
WBC: 4 10*3/uL (ref 4.0–10.5)
WBC: 3.5 10*3/uL — AB (ref 4.0–10.5)

## 2015-05-24 LAB — I-STAT TROPONIN, ED: Troponin i, poc: 0.45 ng/mL (ref 0.00–0.08)

## 2015-05-24 LAB — BASIC METABOLIC PANEL
ANION GAP: 9 (ref 5–15)
BUN: 18 mg/dL (ref 6–20)
CO2: 18 mmol/L — ABNORMAL LOW (ref 22–32)
Calcium: 8.1 mg/dL — ABNORMAL LOW (ref 8.9–10.3)
Chloride: 114 mmol/L — ABNORMAL HIGH (ref 101–111)
Creatinine, Ser: 1.41 mg/dL — ABNORMAL HIGH (ref 0.61–1.24)
GFR calc Af Amer: 53 mL/min — ABNORMAL LOW (ref >60)
GFR, EST NON AFRICAN AMERICAN: 45 mL/min — AB (ref >60)
GLUCOSE: 91 mg/dL (ref 65–99)
POTASSIUM: 4.5 mmol/L (ref 3.5–5.1)
Sodium: 141 mmol/L (ref 135–145)

## 2015-05-24 LAB — MRSA PCR SCREENING: MRSA by PCR: NEGATIVE

## 2015-05-24 LAB — HEPARIN LEVEL (UNFRACTIONATED)
HEPARIN UNFRACTIONATED: 0.59 [IU]/mL (ref 0.30–0.70)
HEPARIN UNFRACTIONATED: 0.77 [IU]/mL — AB (ref 0.30–0.70)

## 2015-05-24 LAB — LACTIC ACID, PLASMA: LACTIC ACID, VENOUS: 2.3 mmol/L — AB (ref 0.5–2.0)

## 2015-05-24 LAB — TROPONIN I: Troponin I: 0.42 ng/mL — ABNORMAL HIGH (ref <0.031)

## 2015-05-24 MED ORDER — SODIUM CHLORIDE 0.9 % IV SOLN: 250.0000 mL | INTRAVENOUS | Status: DC | PRN

## 2015-05-24 MED ORDER — SODIUM CHLORIDE 0.9 % IV SOLN
INTRAVENOUS | Status: DC
Start: 2015-05-24 — End: 2015-05-29
  Administered 2015-05-24 – 2015-05-26 (×3): via INTRAVENOUS

## 2015-05-24 MED ORDER — BOOST / RESOURCE BREEZE PO LIQD
1.0000 | Freq: Two times a day (BID) | ORAL | Status: DC
Start: 2015-05-24 — End: 2015-05-29
  Administered 2015-05-24 – 2015-05-29 (×6): 1 via ORAL

## 2015-05-24 MED ORDER — HEPARIN (PORCINE) IN NACL 100-0.45 UNIT/ML-% IJ SOLN
1000.0000 [IU]/h | INTRAMUSCULAR | Status: DC
  Administered 2015-05-24: 1000 [IU]/h via INTRAVENOUS
  Filled 2015-05-24: qty 250

## 2015-05-24 MED ORDER — INFLUENZA VAC SPLIT QUAD 0.5 ML IM SUSY
0.5000 mL | PREFILLED_SYRINGE | INTRAMUSCULAR | Status: AC
  Administered 2015-05-25: 0.5 mL via INTRAMUSCULAR
  Filled 2015-05-24: qty 0.5

## 2015-05-24 MED ORDER — CETYLPYRIDINIUM CHLORIDE 0.05 % MT LIQD
7.0000 mL | Freq: Two times a day (BID) | OROMUCOSAL | Status: DC
  Administered 2015-05-24 – 2015-05-29 (×10): 7 mL via OROMUCOSAL

## 2015-05-24 MED ORDER — HEPARIN (PORCINE) IN NACL 100-0.45 UNIT/ML-% IJ SOLN
800.0000 [IU]/h | INTRAMUSCULAR | Status: DC
  Administered 2015-05-25: 700 [IU]/h via INTRAVENOUS
  Filled 2015-05-24 (×2): qty 250

## 2015-05-24 MED ORDER — PANTOPRAZOLE SODIUM 40 MG IV SOLR
40.0000 mg | Freq: Every day | INTRAVENOUS | Status: DC
  Administered 2015-05-24 – 2015-05-27 (×4): 40 mg via INTRAVENOUS
  Filled 2015-05-24 (×4): qty 40

## 2015-05-24 MED ORDER — BOOST / RESOURCE BREEZE PO LIQD
1.0000 | Freq: Three times a day (TID) | ORAL | Status: DC
  Administered 2015-05-24: 1 via ORAL

## 2015-05-24 MED ORDER — HEPARIN BOLUS VIA INFUSION
2000.0000 [IU] | Freq: Once | INTRAVENOUS | Status: AC
  Administered 2015-05-24: 2000 [IU] via INTRAVENOUS
  Filled 2015-05-24: qty 2000

## 2015-05-24 MED ORDER — PRO-STAT SUGAR FREE PO LIQD: 30.0000 mL | Freq: Every day | ORAL | Status: DC

## 2015-05-24 MED ORDER — HEPARIN SODIUM (PORCINE) 5000 UNIT/ML IJ SOLN
4000.0000 [IU] | Freq: Once | INTRAMUSCULAR | Status: DC
  Filled 2015-05-24: qty 1

## 2015-05-24 MED ORDER — SODIUM CHLORIDE 0.9 % IV BOLUS (SEPSIS)
1000.0000 mL | Freq: Once | INTRAVENOUS | Status: AC
  Administered 2015-05-24: 1000 mL via INTRAVENOUS

## 2015-05-24 MED ORDER — PRO-STAT SUGAR FREE PO LIQD
30.0000 mL | Freq: Two times a day (BID) | ORAL | Status: DC
  Administered 2015-05-24 – 2015-05-28 (×5): 30 mL via ORAL
  Filled 2015-05-24 (×5): qty 30

## 2015-05-24 MED ORDER — DEXTROSE 5 % IV SOLN
1.0000 g | Freq: Once | INTRAVENOUS | Status: AC
  Administered 2015-05-24: 1 g via INTRAVENOUS
  Filled 2015-05-24: qty 10

## 2015-05-24 NOTE — Progress Notes (Signed)
  Echocardiogram 2D Echocardiogram has been performed.  Benjamin Lawson 05/24/2015, 10:29 AM

## 2015-05-24 NOTE — ED Notes (Signed)
EDP updated on pt's Lactic Acid result.

## 2015-05-24 NOTE — Progress Notes (Signed)
ANTICOAGULATION CONSULT NOTE - Follow up Pharmacy Consult for heparin Indication: pulmonary embolus  No Known Allergies  Patient Measurements: Height: 5\' 11"  (180.3 cm) Weight: 169 lb 8.5 oz (76.9 kg) IBW/kg (Calculated) : 75.3  Heparin dosing weight: 76.9 kg  Vital Signs: Temp: 97.4 F (36.3 C) (09/24 1549) Temp Source: Oral (09/24 1549) BP: 136/90 mmHg (09/24 1800) Pulse Rate: 111 (09/24 1800)  Labs:  Recent Labs  05/23/15 2016  05/23/15 2106 05/24/15 0532 05/24/15 0602 05/24/15 0955 05/24/15 1928  HGB  --   < > 8.6* 8.2*  --  7.8*  --   HCT  --   --  27.2* 24.7*  --  24.2*  --   PLT  --   --  307 265  --  252  --   HEPARINUNFRC  --   --   --   --   --  0.59 0.77*  CREATININE  --   --  1.58* 1.41*  --   --   --   TROPONINI 0.24*  --   --   --  0.42*  --   --   < > = values in this interval not displayed.  Estimated Creatinine Clearance: 44.5 mL/min (by C-G formula based on Cr of 1.41).   Medical History: Past Medical History  Diagnosis Date  . Hypertension   . Hypercholesterolemia   . Chronic kidney disease (CKD), stage II (mild)   . Deafness in left ear   . Peripheral vascular disease   . Prostate cancer      [name] c/o worsening SOB and generalized weakness, CT shows multiple bilateral PE w/ possible right heart strain, Began IV heparin infusion on 9/24 AM. Of note, pt had recent GIB in July 2016, admit Hgb low at 8.6. EDP spoke w/ GI and determined that benefit of heparin outweighs risk. We will aim for lower goal at least at initiation of therapy.    Anticoag:  multiple bilateral PE, +DVT. Lower goal due to recent GIB 02/2015. Hgb has dropped to 7.8, PLTC wnl at 252K. No bleeding noted.  Dr. Lincoln Maxin notes hypercoagulable likely secondary to malignancy (recent diagnosis of gastric cancer).   PM: Heparin level 0.77 increased.   Goal of Therapy:  Heparin level 0.3-0.5 units/ml Monitor platelets by anticoagulation protocol: Yes   Plan:   Decrease IV heparin to 700 units/hr Recheck HL in 8 hrs.   Crystal S. Alford Highland, PharmD, [facility] Clinical Staff Pharmacist Pager [phone]   05/24/2015,8:00 PM

## 2015-05-24 NOTE — Progress Notes (Signed)
UR COMPLETED  

## 2015-05-24 NOTE — Progress Notes (Addendum)
ANTICOAGULATION CONSULT NOTE - Follow up Pharmacy Consult for heparin Indication: pulmonary embolus  No Known Allergies  Patient Measurements: Height: 5\' 11"  (180.3 cm) Weight: 169 lb 8.5 oz (76.9 kg) IBW/kg (Calculated) : 75.3  Vital Signs: Temp: 98.5 F (36.9 C) (09/24 1154) Temp Source: Oral (09/24 1154) BP: 99/67 mmHg (09/24 0900) Pulse Rate: 110 (09/24 0900)  Labs:  Recent Labs  05/21/15 1255 05/23/15 2016  05/23/15 2106 05/24/15 0532 05/24/15 0602 05/24/15 0955  HGB 8.8*  --   < > 8.6* 8.2*  --  7.8*  HCT 26.5*  --   --  27.2* 24.7*  --  24.2*  PLT 341  --   --  307 265  --  252  HEPARINUNFRC  --   --   --   --   --   --  0.59  CREATININE 1.3  --   --  1.58* 1.41*  --   --   TROPONINI  --  0.24*  --   --   --  0.42*  --   < > = values in this interval not displayed.  Estimated Creatinine Clearance: 44.5 mL/min (by C-G formula based on Cr of 1.41).   Medical History: Past Medical History  Diagnosis Date  . Hypertension   . Hypercholesterolemia   . Chronic kidney disease (CKD), stage II (mild)   . Deafness in left ear   . Peripheral vascular disease   . Prostate cancer      [name] c/o worsening SOB and generalized weakness, CT shows multiple bilateral PE w/ possible right heart strain, Began IV heparin infusion on 9/24 AM. Of note, pt had recent GIB in July 2016, admit Hgb low at 8.6. EDP spoke w/ GI and determined that benefit of heparin outweighs risk. We will aim for lower goal at least at initiation of therapy.    Initial heparin level is 0.59 on 1000 units/hr for  multiple bilateral PE, slightly above therapeutic goal 0.3-0.5. Lower goal due to recent GIB 02/2015. Hgb has dropped to 7.8, PLTC wnl at 252K. No bleeding noted.  Dr. Lincoln Maxin notes hypercoagulable likely secondary to malignancy (recent diagnosis of gastric cancer).  Goal of Therapy:  Heparin level 0.3-0.5 units/ml Monitor platelets by anticoagulation protocol: Yes   Plan:   Decrease IV heparin gtt to 900 units/hr  Check 6hour heparin level Daily heparin level and CBC.  Nicole Cella, RPh Clinical Pharmacist Pager: [phone] 05/24/2015,12:02 PM

## 2015-05-24 NOTE — Progress Notes (Signed)
ANTICOAGULATION CONSULT NOTE - Initial Consult  Pharmacy Consult for heparin Indication: pulmonary embolus  No Known Allergies  Patient Measurements: Height: 5\' 11"  (180.3 cm) Weight: 161 lb 2.5 oz (73.1 kg) IBW/kg (Calculated) : 75.3  Vital Signs: Temp: 98.2 F (36.8 C) (09/23 2026) Temp Source: Oral (09/23 2026) BP: 103/74 mmHg (09/23 2130) Pulse Rate: 136 (09/23 2130)  Labs:  Recent Labs  05/21/15 1255 05/23/15 2016 05/23/15 2106  HGB 8.8*  --  8.6*  HCT 26.5*  --  27.2*  PLT 341  --  307  CREATININE 1.3  --  1.58*  TROPONINI  --  0.24*  --     Estimated Creatinine Clearance: 38.6 mL/min (by C-G formula based on Cr of 1.58).   Medical History: Past Medical History  Diagnosis Date  . Hypertension   . Hypercholesterolemia   . Chronic kidney disease (CKD), stage II (mild)   . Deafness in left ear   . Peripheral vascular disease   . Prostate cancer      [name] c/o worsening SOB and generalized weakness, CT shows multiple bilateral PE w/ possible right heart strain, to begin heparin; of note, pt had recent GIB in July 2016, current Hgb low, EDP spoke w/ GI and determined that benefit of heparin outweighs risk, will aim for lower goal at least at initiation of therapy.  Goal of Therapy:  Heparin level 0.3-0.5 units/ml Monitor platelets by anticoagulation protocol: Yes   Plan:  Will give small heparin bolus of 2000 units and begin heparin gtt at 1000 units/hr and monitor heparin levels and CBC.  Wynona Neat, PharmD, BCPS  05/24/2015,12:20 AM

## 2015-05-24 NOTE — Progress Notes (Signed)
*  PRELIMINARY RESULTS* Vascular Ultrasound Lower extremity venous duplex has been completed.  Preliminary findings: DVT noted in the left soleal vein. No DVT RLE.   Landry Mellow, RDMS, RVT  05/24/2015, 4:01 PM

## 2015-05-24 NOTE — H&P (Signed)
PDenies chest painULMONARY / CRITICAL CARE MEDICINE   Name: Benjamin Lawson MRN: [id number] DOB: February 25, 1933    ADMISSION DATE:  05/23/2015 CONSULTATION DATE:  05/24/15  REFERRING MD :  ED physician  CHIEF COMPLAINT:  PE  INITIAL PRESENTATION: shortness of breath  STUDIES:  CTA 05/23/15 - showed bilateral PE  SIGNIFICANT EVENTS: Started on heparin gtt 9/23   HISTORY OF PRESENT ILLNESS:  Benjamin Lawson is a lovely 81 y/o man with pmh of HTN, CKD and prostate cancer.  He was discharged from a rehab facility the afternoon of presentation where he was recovering from a recent hospitalization for GI bleed, during which he was diagnosed with gastric cancer.  His daughter, who is a Marine scientist, picked him up from his rehab facility and as they were walking from the car to her home, Benjamin Lawson became acutely short of breath.  She measured his vital signs and found that he was hypoxemic and tachycardic at which time she called 911.  A CTA done on arrival showed bilateral PEs. Benjamin Lawson has no past history of VTE.  He hs completed radiation therapy for gastric cancer as well as 3 of 5 chemotherapy treatments (taxol and carboplatinin).  Due to a decline in his functional status his oncologist is not planning any further chemotherapy.  Of note, on presentation last month he was found to have frank melena (this is what prompted the EGD that revealed his gastric cancer).    SUBJECTIVE:  Blood pressure remained stable On 4 L oxygen    VITAL SIGNS: Temp:  [97.6 F (36.4 C)-98.5 F (36.9 C)] 98.5 F (36.9 C) (09/24 1154) Pulse Rate:  [57-149] 110 (09/24 0900) Resp:  [16-33] 21 (09/24 0900) BP: (96-128)/(60-87) 99/67 mmHg (09/24 0900) SpO2:  [88 %-98 %] 98 % (09/24 0900) Weight:  [73.1 kg (161 lb 2.5 oz)-76.9 kg (169 lb 8.5 oz)] 76.9 kg (169 lb 8.5 oz) (09/24 0358) HEMODYNAMICS:   VENTILATOR SETTINGS:   INTAKE / OUTPUT:  Intake/Output Summary (Last 24 hours) at 05/24/15 1349 Last data filed at  05/24/15 0900  Gross per 24 hour  Intake 1684.84 ml  Output     75 ml  Net 1609.84 ml    PHYSICAL EXAMINATION: Physical Exam  Constitutional: He is oriented to person, place, and time and well-developed, well-nourished, and in no distress.  HENT:  Head: Normocephalic and atraumatic.  Nose: Nose normal.  Mouth/Throat: Oropharynx is clear and moist.  Eyes: Conjunctivae and EOM are normal. Pupils are equal, round, and reactive to light.  Neck: Normal range of motion. Neck supple. No JVD present.  Cardiovascular: Regular rhythm, normal heart sounds and intact distal pulses.  Exam reveals no gallop and no friction rub.   No murmur heard. Tachycardic  Pulmonary/Chest: Effort normal and breath sounds normal. No respiratory distress. He has no wheezes. He has no rales. He exhibits no tenderness.  Abdominal: Soft. Bowel sounds are normal. He exhibits no distension and no mass. There is no tenderness. There is no rebound and no guarding.  Musculoskeletal: Normal range of motion. He exhibits no edema.  Lymphadenopathy:    He has no cervical adenopathy.  Neurological: He is alert and oriented to person, place, and time.  Skin: Skin is warm and dry. No rash noted. No erythema. There is pallor.    LABS:  CBC  Recent Labs Lab 05/23/15 2106 05/24/15 0532 05/24/15 0955  WBC 3.5* 4.0 3.5*  HGB 8.6* 8.2* 7.8*  HCT 27.2* 24.7* 24.2*  PLT 307  265 252   Coag's No results for input(s): APTT, INR in the last 168 hours. BMET  Recent Labs Lab 05/21/15 1255 05/23/15 2106 05/24/15 0532  NA 143 138 141  K 4.9 4.9 4.5  CL  --  110 114*  CO2 23 17* 18*  BUN 19.1 20 18   CREATININE 1.3 1.58* 1.41*  GLUCOSE 104 109* 91   Electrolytes  Recent Labs Lab 05/21/15 1255 05/23/15 2106 05/24/15 0532  CALCIUM 8.8 8.5* 8.1*   Sepsis Markers  Recent Labs Lab 05/23/15 2050 05/24/15 0046  LATICACIDVEN 2.6* 2.3*   ABG No results for input(s): PHART, PCO2ART, PO2ART in the last 168  hours. Liver Enzymes  Recent Labs Lab 05/21/15 1255  AST 23  ALT 15  ALKPHOS 75  BILITOT <0.30  ALBUMIN 2.5*   Cardiac Enzymes  Recent Labs Lab 05/23/15 2016 05/24/15 0602  TROPONINI 0.24* 0.42*   Glucose No results for input(s): GLUCAP in the last 168 hours.  Imaging Dg Chest 2 View  05/23/2015   CLINICAL DATA:  Worsening shortness of breath and generalize weakness for several days. History of gastric cancer post chemotherapy.  EXAM: CHEST  2 VIEW  COMPARISON:  05/08/2015  FINDINGS: Normal heart size and pulmonary vascularity. Chronic elevation of the left hemidiaphragm with prominent gas-filled dilated colon. Gaseous distention of the colon is similar to prior study and suggests colonic ileus. Atelectasis in the left lung base. Right lung appears clear and expanded. Power port type central venous catheter with tip over the low SVC region. Calcified and tortuous aorta. Degenerative changes in the spine and shoulders.  IMPRESSION: Chronic elevation of the left hemidiaphragm with atelectasis in the left lung base. No evidence of active pulmonary disease. Again demonstrated is prominent gaseous distention of the colon suggesting colonic ileus.   Electronically Signed   By: Lucienne Capers M.D.   On: 05/23/2015 22:13   Ct Angio Chest Pe W/cm &/or Wo Cm  05/23/2015   CLINICAL DATA:  Extreme shortness of breath in a patient who is had recent pulmonary embolus. Hypoxia, tachycardia, shortness of breath, history of gastric cancer and prostate cancer.  EXAM: CT ANGIOGRAPHY CHEST WITH CONTRAST  TECHNIQUE: Multidetector CT imaging of the chest was performed using the standard protocol during bolus administration of intravenous contrast. Multiplanar CT image reconstructions and MIPs were obtained to evaluate the vascular anatomy.  CONTRAST:  74mL OMNIPAQUE IOHEXOL 350 MG/ML SOLN  COMPARISON:  None.  FINDINGS: Technically adequate study with good opacification of the central and segmental pulmonary  arteries. Multiple filling defects are demonstrated in the distal right and left main pulmonary arteries with extension into bilateral upper and lower lobe segmental branches. RV to LV ratio is 1.4 suggesting possible right heart strain.  Normal heart size. Normal caliber thoracic aorta. No evidence of aortic dissection. Calcification in the aorta and coronary arteries. Great vessel origins are patent. Esophagus is decompressed. Prominent lymph nodes in the right paratracheal and pretracheal region without pathologic enlargement.  Elevation of the left hemidiaphragm. Small left pleural effusion with atelectasis or consolidation in the left lung base. Emphysematous changes in the lungs. Scarring in the lung apices. No pneumothorax.  Included portions of the upper lungs demonstrate gastric wall thickening in a decompressed stomach. This may correlate with patient's known history of gastric carcinoma. Spleen is atrophic. Colon is distended. Degenerative changes in the spine. No destructive bone lesions.  Review of the MIP images confirms the above findings.  IMPRESSION: Examination is positive for multiple bilateral pulmonary  emboli. Increased RV to LV ratio suggest possibility of right heart strain. Elevation of left hemidiaphragm with small left pleural effusion and atelectasis or consolidation in the left lung base. Gaseous distention of the colon. Gastric wall thickening may correspond to history of gastric carcinoma.  These results were called by telephone at the time of interpretation on 05/23/2015 at 11:50 pm to Dr. Antony Blackbird , who verbally acknowledged these results.   Electronically Signed   By: Lucienne Capers M.D.   On: 05/23/2015 23:53     ASSESSMENT / PLAN:  PULMONARY  A:Bilateral PE P:   On heparin gtt Not a candidate for lytic therapy due to recent GI bleed  Obtain duplex - may need IVC filter if rebleed, doubt he is a candidate for long term anticoagulation  CARDIOVASCULAR A: RVSP 53  on echo  P:   follow BP  RENAL A:  CKD P:   Gentle hydration as he received contrast for CTA  GASTROINTESTINAL A:  Gastric cancer - recent GI bleed P:   Monitor closely for melena  HEMATOLOGIC A:  PE - hypercoagulable likely secondary to malignancy P:  Heparin gtt    FAMILY  - Updates:  Daughter at bedside 9/23.  Per daughter patient previously had a MOST form that stated he wished to be DNR/DNI.  Upon my discussion pt stated he would want CPR and intubation if we thought it would "bring him back".  After further discussion he decided that he would want to decide at the time and that if he was not able to make decisions for himself stated his daughter knew his wishes and said that she could make end of life decisions on his behalf.   - Inter-disciplinary family meet or Palliative Care meeting due by:  day 7    TODAY'S SUMMARY: PE on heparin gtt  Transfer to SDU & to triad 9/25  Kara Mead MD. [facility]. [facility] Pager [phone] If no response call [phone]    05/24/2015, 1:49 PM

## 2015-05-24 NOTE — Progress Notes (Addendum)
Initial Nutrition Assessment  DOCUMENTATION CODES:  Not applicable  INTERVENTION:  Boost Breeze po TID, each supplement provides 250 kcal and 9 grams of protein  Prostat, 60 ml daily, each 30 mls provides 100 kcal and 15 grams of protein   NUTRITION DIAGNOSIS:  Increased protein needs related to wound healing, cancer and cancer related treatments as evidenced by estimated requirements for the conditions   GOAL:  Patient will meet greater than or equal to 90% of their needs  MONITOR:  PO intake, Supplement acceptance, Diet advancement, Skin, Labs, I & O's  REASON FOR ASSESSMENT:  Malnutrition Screening Tool    ASSESSMENT:  81 y/o man with pmh of HTN, CKD and prostate/Gastric cancer. He has complete 3/5 cycles chemo, but d/t decline in status therapy has stopped. Presents after becoming acutely SOB and was found to have bilateral PE.  Pt's daughter was not present on RD arrival. Tried to talk with pt, but many of his answers were nonsensical.   He reports that his appetite really hasn't changed much recently even though he has been on chemo. 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   He thought he was about at his normal weight.   Noted untouched CL tray in front of pt. He was agreeable to Ensure Clear.   NFPE: Mild temporal wasting, rest WDL  Diet Order:  Diet clear liquid Room service appropriate?: Yes; Fluid consistency:: Thin  Skin:  PU stage 1 sacrum, abrasion, dryness  Last BM:  9/23  Height:  Ht Readings from Last 1 Encounters:  05/24/15 5\' 11"  (1.803 m)   Weight:  Wt Readings from Last 1 Encounters:  05/24/15 169 lb 8.5 oz (76.9 kg)   Wt Readings from Last 10 Encounters:  05/24/15 169 lb 8.5 oz (76.9 kg)  05/21/15 161 lb 3.2 oz (73.12 kg)  05/19/15 164 lb 8 oz (74.617 kg)  05/15/15 165 lb 8 oz (75.07 kg)  05/09/15 169 lb 12.8 oz (77.021 kg)  05/02/15 168 lb 6.4 oz (76.386 kg)  04/25/15 176 lb 11.2 oz (80.151 kg)  04/21/15 174 lb 12.8 oz (79.289 kg)   04/18/15 178 lb 9.6 oz (81.012 kg)  04/10/15 176 lb 3.2 oz (79.924 kg)  Admit weight 161 lbs (73.2 kg)  Ideal Body Weight:  78.2 kg  BMI:  Body mass index is 23.66 kg/(m^2).  Estimated Nutritional Needs:  Kcal:  1700-1900 (23-26 kcal/kg) Protein:  88-102 g (1.2-1.4 g/kg bw) Fluid:  1.7-1.9 liters fluid  EDUCATION NEEDS:  No education needs identified at this time  Burtis Junes RD, LDN Nutrition Pager: [phone] 05/24/2015 9:58 AM

## 2015-05-24 NOTE — H&P (Signed)
PULMONARY / CRITICAL CARE MEDICINE   Name: NETTIE CROMWELL MRN: [id number] DOB: 1933/02/01    ADMISSION DATE:  05/23/2015 CONSULTATION DATE:  05/24/15  REFERRING MD :  ED physician  CHIEF COMPLAINT:  PE  INITIAL PRESENTATION: shortness of breath  STUDIES:  CTA 05/23/15 - showed bilateral PE  SIGNIFICANT EVENTS: Started on heparin gtt 9/23   HISTORY OF PRESENT ILLNESS:  Mr. Santellan is a lovely 81 y/o man with pmh of HTN, CKD and prostate cancer.  He was discharged from a rehab facility the afternoon of presentation where he was recovering from a recent hospitalization for GI bleed, during which he was diagnosed with gastric cancer.  His daughter, who is a Marine scientist, picked him up from his rehab facility and as they were walking from the car to her home, Mr. Wages became acutely short of breath.  She measured his vital signs and found that he was hypoxemic and tachycardic at which time she called 911.  A CTA done on arrival showed bilateral PEs. Mr. Haymer has no past history of VTE.  He hs completed radiation therapy for gastric cancer as well as 3 of 5 chemotherapy treatments (taxol and carboplatinin).  Due to a decline in his functional status his oncologist is not planning any further chemotherapy.  Of note, on presentation last month he was found to have frank melena (this is what prompted the EGD that revealed his gastric cancer).   PAST MEDICAL HISTORY :   has a past medical history of Hypertension; Hypercholesterolemia; Chronic kidney disease (CKD), stage II (mild); Deafness in left ear; Peripheral vascular disease; and Prostate cancer.  has past surgical history that includes Multiple tooth extractions (01/2015); Transurethral resection of prostate (1990's); and Esophagogastroduodenoscopy (N/A, 03/28/2015). Prior to Admission medications   Medication Sig Start Date End Date Taking? Authorizing Provider  atorvastatin (LIPITOR) 20 MG tablet Take 20 mg by mouth daily.    Historical  Provider, MD  ENSURE PLUS (ENSURE PLUS) LIQD Take 237 mLs by mouth 2 (two) times daily between meals.    Historical Provider, MD  lidocaine-prilocaine (EMLA) cream APPLY 5 ML TOPICALLY TO PORT-A-CATH 1 1/2 HOURS BEFORE CHEMOTHERAPY, AS NEEDED 04/30/15   Historical Provider, MD  Lidocaine-Prilocaine, Bulk, 2.5-2.5 % CREA Apply 5 mLs topically as needed. To port-a-cath 1.5 hours before chemotherapy 04/30/15   Ladell Pier, MD  metoprolol succinate (TOPROL-XL) 50 MG 24 hr tablet Take 50 mg by mouth daily. Take with or immediately following a meal.    Historical Provider, MD  MICARDIS HCT 80-12.5 MG per tablet Take 1 capsule by mouth daily. 04/23/15   Historical Provider, MD  pantoprazole (PROTONIX) 40 MG tablet Take 1 tablet (40 mg total) by mouth 2 (two) times daily before a meal. 04/03/15   Reyne Dumas, MD  sennosides-docusate sodium (SENOKOT-S) 8.6-50 MG tablet Take 1 tablet by mouth daily.    Historical Provider, MD  tolterodine (DETROL LA) 4 MG 24 hr capsule Take 4 mg by mouth daily.    Historical Provider, MD   No Known Allergies  FAMILY HISTORY:  has no family status information on file.  SOCIAL HISTORY:  reports that he has quit smoking. His smoking use included Cigarettes. He smoked 0.00 packs per day for 0 years. He has never used smokeless tobacco. He reports that he drinks alcohol. He reports that he does not use illicit drugs.  REVIEW OF SYSTEMS:  A review of 14 systems was negative except as stated in the HPI.   SUBJECTIVE:  VITAL SIGNS: Temp:  [98.2 F (36.8 C)] 98.2 F (36.8 C) (09/23 2026) Pulse Rate:  [57-149] 136 (09/23 2130) Resp:  [16-33] 21 (09/24 0100) BP: (103-128)/(72-85) 112/80 mmHg (09/24 0100) SpO2:  [88 %-94 %] 89 % (09/23 2130) Weight:  [73.1 kg (161 lb 2.5 oz)] 73.1 kg (161 lb 2.5 oz) (09/24 0000) HEMODYNAMICS:   VENTILATOR SETTINGS:   INTAKE / OUTPUT:  Intake/Output Summary (Last 24 hours) at 05/24/15 0219 Last data filed at 05/23/15 2244  Gross per  24 hour  Intake   1000 ml  Output     75 ml  Net    925 ml    PHYSICAL EXAMINATION: Physical Exam  Constitutional: He is oriented to person, place, and time and well-developed, well-nourished, and in no distress.  HENT:  Head: Normocephalic and atraumatic.  Nose: Nose normal.  Mouth/Throat: Oropharynx is clear and moist.  Eyes: Conjunctivae and EOM are normal. Pupils are equal, round, and reactive to light.  Neck: Normal range of motion. Neck supple. No JVD present.  Cardiovascular: Regular rhythm, normal heart sounds and intact distal pulses.  Exam reveals no gallop and no friction rub.   No murmur heard. Tachycardic  Pulmonary/Chest: Effort normal and breath sounds normal. No respiratory distress. He has no wheezes. He has no rales. He exhibits no tenderness.  Abdominal: Soft. Bowel sounds are normal. He exhibits no distension and no mass. There is no tenderness. There is no rebound and no guarding.  Musculoskeletal: Normal range of motion. He exhibits no edema.  Lymphadenopathy:    He has no cervical adenopathy.  Neurological: He is alert and oriented to person, place, and time.  Skin: Skin is warm and dry. No rash noted. No erythema. There is pallor.    LABS:  CBC  Recent Labs Lab 05/21/15 1255 05/23/15 2106  WBC 2.3* 3.5*  HGB 8.8* 8.6*  HCT 26.5* 27.2*  PLT 341 307   Coag's No results for input(s): APTT, INR in the last 168 hours. BMET  Recent Labs Lab 05/21/15 1255 05/23/15 2106  NA 143 138  K 4.9 4.9  CL  --  110  CO2 23 17*  BUN 19.1 20  CREATININE 1.3 1.58*  GLUCOSE 104 109*   Electrolytes  Recent Labs Lab 05/21/15 1255 05/23/15 2106  CALCIUM 8.8 8.5*   Sepsis Markers  Recent Labs Lab 05/23/15 2050 05/24/15 0046  LATICACIDVEN 2.6* 2.3*   ABG No results for input(s): PHART, PCO2ART, PO2ART in the last 168 hours. Liver Enzymes  Recent Labs Lab 05/21/15 1255  AST 23  ALT 15  ALKPHOS 75  BILITOT <0.30  ALBUMIN 2.5*   Cardiac  Enzymes  Recent Labs Lab 05/23/15 2016  TROPONINI 0.24*   Glucose No results for input(s): GLUCAP in the last 168 hours.  Imaging Dg Chest 2 View  05/23/2015   CLINICAL DATA:  Worsening shortness of breath and generalize weakness for several days. History of gastric cancer post chemotherapy.  EXAM: CHEST  2 VIEW  COMPARISON:  05/08/2015  FINDINGS: Normal heart size and pulmonary vascularity. Chronic elevation of the left hemidiaphragm with prominent gas-filled dilated colon. Gaseous distention of the colon is similar to prior study and suggests colonic ileus. Atelectasis in the left lung base. Right lung appears clear and expanded. Power port type central venous catheter with tip over the low SVC region. Calcified and tortuous aorta. Degenerative changes in the spine and shoulders.  IMPRESSION: Chronic elevation of the left hemidiaphragm with atelectasis in the left lung  base. No evidence of active pulmonary disease. Again demonstrated is prominent gaseous distention of the colon suggesting colonic ileus.   Electronically Signed   By: Lucienne Capers M.D.   On: 05/23/2015 22:13   Ct Angio Chest Pe W/cm &/or Wo Cm  05/23/2015   CLINICAL DATA:  Extreme shortness of breath in a patient who is had recent pulmonary embolus. Hypoxia, tachycardia, shortness of breath, history of gastric cancer and prostate cancer.  EXAM: CT ANGIOGRAPHY CHEST WITH CONTRAST  TECHNIQUE: Multidetector CT imaging of the chest was performed using the standard protocol during bolus administration of intravenous contrast. Multiplanar CT image reconstructions and MIPs were obtained to evaluate the vascular anatomy.  CONTRAST:  66mL OMNIPAQUE IOHEXOL 350 MG/ML SOLN  COMPARISON:  None.  FINDINGS: Technically adequate study with good opacification of the central and segmental pulmonary arteries. Multiple filling defects are demonstrated in the distal right and left main pulmonary arteries with extension into bilateral upper and lower  lobe segmental branches. RV to LV ratio is 1.4 suggesting possible right heart strain.  Normal heart size. Normal caliber thoracic aorta. No evidence of aortic dissection. Calcification in the aorta and coronary arteries. Great vessel origins are patent. Esophagus is decompressed. Prominent lymph nodes in the right paratracheal and pretracheal region without pathologic enlargement.  Elevation of the left hemidiaphragm. Small left pleural effusion with atelectasis or consolidation in the left lung base. Emphysematous changes in the lungs. Scarring in the lung apices. No pneumothorax.  Included portions of the upper lungs demonstrate gastric wall thickening in a decompressed stomach. This may correlate with patient's known history of gastric carcinoma. Spleen is atrophic. Colon is distended. Degenerative changes in the spine. No destructive bone lesions.  Review of the MIP images confirms the above findings.  IMPRESSION: Examination is positive for multiple bilateral pulmonary emboli. Increased RV to LV ratio suggest possibility of right heart strain. Elevation of left hemidiaphragm with small left pleural effusion and atelectasis or consolidation in the left lung base. Gaseous distention of the colon. Gastric wall thickening may correspond to history of gastric carcinoma.  These results were called by telephone at the time of interpretation on 05/23/2015 at 11:50 pm to Dr. Antony Blackbird , who verbally acknowledged these results.   Electronically Signed   By: Lucienne Capers M.D.   On: 05/23/2015 23:53     ASSESSMENT / PLAN:  PULMONARY  A:Bilateral PE P:   Satting in low 90s on 5L O2 On heparin gtt Not a candidate for lytic therapy due to recent GI bleed   CARDIOVASCULAR A: Tachycardic P:  Likely secondary to PE  Bolus 1L NS  RENAL A:  CKD P:   Gentle hydration as he received contrast for CTA  GASTROINTESTINAL A:  Gastric cancer - recent GI bleed P:   Monitor closely for  melena  HEMATOLOGIC A:  PE - hypercoagulable likely secondary to malignancy P:  Heparin gtt - if does not have recurrence of GI bleed will transition to lovenox Will likely need lifelong anticoagulation with lovenox    FAMILY  - Updates:  Daughter at bedside.  Per daughter patient previously had a MOST form that stated he wished to be DNR/DNI.  Upon my discussion pt stated he would want CPR and intubation if we thought it would "bring him back".  After further discussion he decided that he would want to decide at the time and that if he was not able to make decisions for himself stated his daughter knew his wishes  and said that she could make end of life decisions on his behalf.   - Inter-disciplinary family meet or Palliative Care meeting due by:  day 7    TODAY'S SUMMARY: PE on heparin gtt  I spent 35 minutes of critical care time in the care of this patient seperate from procedures which are documented elsewhere  Reginia Forts MD, PhD Pulmonary and [place] Pager: [phone]  05/24/2015, 2:19 AM

## 2015-05-24 NOTE — Progress Notes (Signed)
There are currently no advanced directives. Patient's daughter said that she would consider making her father a DNR.

## 2015-05-25 ENCOUNTER — Encounter (HOSPITAL_COMMUNITY): Payer: Self-pay | Admitting: Radiology

## 2015-05-25 DIAGNOSIS — N390 Urinary tract infection, site not specified: Secondary | ICD-10-CM

## 2015-05-25 DIAGNOSIS — M7989 Other specified soft tissue disorders: Secondary | ICD-10-CM

## 2015-05-25 DIAGNOSIS — E876 Hypokalemia: Secondary | ICD-10-CM

## 2015-05-25 DIAGNOSIS — I519 Heart disease, unspecified: Secondary | ICD-10-CM

## 2015-05-25 DIAGNOSIS — I2699 Other pulmonary embolism without acute cor pulmonale: Secondary | ICD-10-CM | POA: Diagnosis not present

## 2015-05-25 LAB — CBC
HEMATOCRIT: 22.9 % — AB (ref 39.0–52.0)
HEMOGLOBIN: 7.7 g/dL — AB (ref 13.0–17.0)
MCH: 28.8 pg (ref 26.0–34.0)
MCHC: 33.6 g/dL (ref 30.0–36.0)
MCV: 85.8 fL (ref 78.0–100.0)
Platelets: 227 10*3/uL (ref 150–400)
RBC: 2.67 MIL/uL — ABNORMAL LOW (ref 4.22–5.81)
RDW: 18 % — ABNORMAL HIGH (ref 11.5–15.5)
WBC: 4 10*3/uL (ref 4.0–10.5)

## 2015-05-25 LAB — HEPARIN LEVEL (UNFRACTIONATED)
HEPARIN UNFRACTIONATED: 0.4 [IU]/mL (ref 0.30–0.70)
Heparin Unfractionated: 0.38 IU/mL (ref 0.30–0.70)

## 2015-05-25 MED ORDER — PROCHLORPERAZINE EDISYLATE 5 MG/ML IJ SOLN
10.0000 mg | Freq: Four times a day (QID) | INTRAMUSCULAR | Status: DC | PRN
  Administered 2015-05-25: 10 mg via INTRAVENOUS
  Filled 2015-05-25 (×3): qty 2

## 2015-05-25 NOTE — H&P (Signed)
PDenies chest painULMONARY / CRITICAL CARE MEDICINE   Name: Benjamin Lawson MRN: [id number] DOB: 1932-10-16    ADMISSION DATE:  05/23/2015 CONSULTATION DATE:  05/24/15  REFERRING MD :  ED physician  CHIEF COMPLAINT:  PE  INITIAL PRESENTATION: shortness of breath  STUDIES:  CTA 05/23/15 - showed bilateral PE Duplex 9/24 >> left soleal vein  SIGNIFICANT EVENTS: Started on heparin gtt 9/23   HISTORY OF PRESENT ILLNESS:  Benjamin Lawson is a lovely 81 y/o man with pmh of HTN, CKD and prostate cancer.  He was discharged from a rehab facility the afternoon of presentation where he was recovering from a recent hospitalization for GI bleed, during which he was diagnosed with gastric cancer.  His daughter, who is a Marine scientist, picked him up from his rehab facility and as they were walking from the car to her home, Benjamin Lawson became acutely short of breath.  She measured his vital signs and found that he was hypoxemic and tachycardic at which time she called 911.  A CTA done on arrival showed bilateral PEs. Benjamin Lawson has no past history of VTE.  He hs completed radiation therapy for gastric cancer as well as 3 of 5 chemotherapy treatments (taxol and carboplatinin).  Due to a decline in his functional status his oncologist is not planning any further chemotherapy.  Of note, on presentation last month he was found to have frank melena (this is what prompted the EGD that revealed his gastric cancer).    SUBJECTIVE:  Blood pressure remained stable On 2-3 L oxygen    VITAL SIGNS: Temp:  [97.4 F (36.3 C)-98.6 F (37 C)] 98.6 F (37 C) (09/25 0718) Pulse Rate:  [81-165] 86 (09/25 0600) Resp:  [11-25] 14 (09/25 0718) BP: (94-136)/(62-90) 105/72 mmHg (09/25 0718) SpO2:  [75 %-100 %] 100 % (09/25 0718) Weight:  [79.5 kg (175 lb 4.3 oz)] 79.5 kg (175 lb 4.3 oz) (09/25 0427) HEMODYNAMICS:   VENTILATOR SETTINGS:   INTAKE / OUTPUT:  Intake/Output Summary (Last 24 hours) at 05/25/15 0743 Last data  filed at 05/25/15 0700  Gross per 24 hour  Intake 4065.76 ml  Output    300 ml  Net 3765.76 ml    PHYSICAL EXAMINATION: Physical Exam  Constitutional: He is oriented to person, place, and time and well-developed, well-nourished, and in no distress.  HENT:  Head: Normocephalic and atraumatic.  Nose: Nose normal.  Mouth/Throat: Oropharynx is clear and moist.  Eyes: Conjunctivae and EOM are normal. Pupils are equal, round, and reactive to light.  Neck: Normal range of motion. Neck supple. No JVD present.  Cardiovascular: Regular rhythm, normal heart sounds and intact distal pulses.  Exam reveals no gallop and no friction rub.   No murmur heard. Tachycardic  Pulmonary/Chest: Effort normal and breath sounds normal. No respiratory distress. He has no wheezes. He has no rales. He exhibits no tenderness.  Abdominal: Soft. Bowel sounds are normal. He exhibits no distension and no mass. There is no tenderness. There is no rebound and no guarding.  Musculoskeletal: Normal range of motion. He exhibits no edema.  Lymphadenopathy:    He has no cervical adenopathy.  Neurological: He is alert and oriented to person, place, and time.  Skin: Skin is warm and dry. No rash noted. No erythema. There is pallor.    LABS:  CBC  Recent Labs Lab 05/24/15 0532 05/24/15 0955 05/25/15 0500  WBC 4.0 3.5* 4.0  HGB 8.2* 7.8* 7.7*  HCT 24.7* 24.2* 22.9*  PLT 265  252 227   Coag's No results for input(s): APTT, INR in the last 168 hours. BMET  Recent Labs Lab 05/21/15 1255 05/23/15 2106 05/24/15 0532  NA 143 138 141  K 4.9 4.9 4.5  CL  --  110 114*  CO2 23 17* 18*  BUN 19.1 20 18   CREATININE 1.3 1.58* 1.41*  GLUCOSE 104 109* 91   Electrolytes  Recent Labs Lab 05/21/15 1255 05/23/15 2106 05/24/15 0532  CALCIUM 8.8 8.5* 8.1*   Sepsis Markers  Recent Labs Lab 05/23/15 2050 05/24/15 0046  LATICACIDVEN 2.6* 2.3*   ABG No results for input(s): PHART, PCO2ART, PO2ART in the last  168 hours. Liver Enzymes  Recent Labs Lab 05/21/15 1255  AST 23  ALT 15  ALKPHOS 75  BILITOT <0.30  ALBUMIN 2.5*   Cardiac Enzymes  Recent Labs Lab 05/23/15 2016 05/24/15 0602  TROPONINI 0.24* 0.42*   Glucose No results for input(s): GLUCAP in the last 168 hours.  Imaging No results found.   ASSESSMENT / PLAN:  PULMONARY  A: Sub massive Bilateral PE P:   On heparin gtt Not a candidate for lytic therapy due to recent GI bleed   Likely need IVC filter - hb trending down , doubt he is a candidate for long term anticoagulation  CARDIOVASCULAR A: Pulmonary hypertension RVSP 53 on echo  P:    RENAL A:  CKD P:   Gentle hydration as he received contrast for CTA  GASTROINTESTINAL A:  Gastric cancer - recent GI bleed P:   Monitor closely for melena  HEMATOLOGIC A:  PE - hypercoagulable likely secondary to malignancy P:  Heparin gtt -low threshold to stop once filter placed doubt he is a candidate for long term anticoagulation    FAMILY  - Updates:  Daughter  Carlyon Shadow 9/25- discussed IVC filter  Per daughter patient previously had a MOST form that stated he wished to be DNR/DNI.  -reinstated  - Inter-disciplinary family meet or Palliative Care meeting due by:  day 7    TODAY'S SUMMARY: PE on heparin gtt, plan for IVC filter  Transfer to tele & to [facility] 9/25  Kara Mead MD. [facility]. [place] [facility] Pager [phone] If no response call [phone]    05/25/2015, 7:43 AM

## 2015-05-25 NOTE — Progress Notes (Signed)
ANTICOAGULATION CONSULT NOTE - Follow Up Consult  Pharmacy Consult for heparin Indication: pulmonary embolus  Labs:  Recent Labs  05/23/15 2016  05/23/15 2106 05/24/15 0532 05/24/15 0602 05/24/15 0955 05/24/15 1928 05/25/15 0500  HGB  --   < > 8.6* 8.2*  --  7.8*  --  7.7*  HCT  --   < > 27.2* 24.7*  --  24.2*  --  22.9*  PLT  --   < > 307 265  --  252  --  227  HEPARINUNFRC  --   --   --   --   --  0.59 0.77* 0.40  CREATININE  --   --  1.58* 1.41*  --   --   --   --   TROPONINI 0.24*  --   --   --  0.42*  --   --   --   < > = values in this interval not displayed.   Assessment/Plan:  81yo male therapeutic on heparin after rate change. Will continue gtt at current rate and confirm stable with additional level.   Wynona Neat, PharmD, BCPS  05/25/2015,5:49 AM

## 2015-05-25 NOTE — Progress Notes (Signed)
[facility] TEAM 1 - Stepdown/ICU TEAM Progress Note  MALIN CERVINI FXO:[id number] DOB: [birth year] DOA: 05/23/2015 PCP: Donnajean Lopes, MD  Admit HPI / Brief Narrative: 81 y/o man with PMHx HTN, CKD, Prostate Cancer, gastric cancer S/P XRT + 3 of 5 chemotherapy treatments (taxol and carboplatinin).   Discharged from a rehab facility the afternoon of presentation where he was recovering from a recent hospitalization for GI bleed, during which he was diagnosed with gastric cancer. His daughter, who is a [employer] scientist, picked him up from his rehab facility and as they were walking from the car to her home, Mr. Blough became acutely short of breath. She measured his vital signs and found that he was hypoxemic and tachycardic at which time she called 911. A CTA done on arrival showed bilateral PEs. Mr. Kaeser has no past history of VTE. Due to a decline in his functional status his oncologist is not planning any further chemotherapy. Of note, on presentation last month he was found to have frank melena (this is what prompted the EGD that revealed his gastric cancer).   HPI/Subjective: 9/25 A/O 2 (does not know where, why), follows all commands. Patient was admitted~0800 by PCCM  Assessment/Plan: NOTE;Patient was admitted~0800 by PCCM today. Will follow PCCM plan as below   Bilateral PE P:  Satting in low 90s on 5L O2 On heparin gtt Not a candidate for lytic therapy due to recent GI bleed   Tachycardic P:  Likely secondary to PE  Bolus 1L NS  Pulmonary hypertension  Diastolic dysfunction  RENAL A: CKD P:  Gentle hydration as he received contrast for CTA  GASTROINTESTINAL A: Gastric cancer - recent GI bleed P:  Monitor closely for melena  HEMATOLOGIC A: PE - hypercoagulable likely secondary to malignancy P:  Heparin gtt - if does not have recurrence of GI bleed will transition to lovenox Will likely need lifelong anticoagulation with lovenox   Code Status:  FULL Family Communication: no family present at time of exam Disposition Plan: SNF vs palliative care vs hospice    Consultants:   Procedure/Significant Events: 9/24 echocardiogram;- Left ventricle: mild LVH. -LVEF= 65%-70%.  -(grade 1 diastolic dysfunction). - Pulmonary arteries: PA peak pressure: 53 mm Hg (S).    Culture   Antibiotics:   DVT prophylaxis:    Devices    LINES / TUBES:      Continuous Infusions: . sodium chloride 50 mL/hr at 05/25/15 0800  . heparin 700 Units/hr (05/25/15 0249)    Objective: VITAL SIGNS: Temp: 97.8 F (36.6 C) (09/25 1125) Temp Source: Other (Comment) (09/25 1125) BP: 129/101 mmHg (09/25 1125) Pulse Rate: 86 (09/25 0600) SPO2; FIO2:   Intake/Output Summary (Last 24 hours) at 05/25/15 1426 Last data filed at 05/25/15 0700  Gross per 24 hour  Intake 3217.76 ml  Output    300 ml  Net 2917.76 ml     Exam: General:A/O 2 (does not know where, why), follows all commands., No acute respiratory distress Eyes: Negative headache, negative scleral hemorrhage ENT: Negative Runny nose, negative ear pain, negative gingival bleeding, Neck:  Negative scars, masses, torticollis, lymphadenopathy, JVD Lungs: Clear to auscultation bilaterally without wheezes or crackles Cardiovascular: Irregular regular rhythm and rate, without murmur gallop or rub normal S1 and S2, port present right upper chest wall negative signs of infection Abdomen:negative abdominal pain, nondistended, positive soft, bowel sounds, no rebound, no ascites, no appreciable mass Extremities: No significant cyanosis, clubbing, or edema bilateral lower extremities Psychiatric:  Negative depression, negative anxiety,  negative fatigue, negative mania  Neurologic:  Cranial nerves II through XII intact, tongue/uvula midline, all extremities muscle strength 5/5, sensation intact negative dysarthria, negative expressive aphasia, negative receptive aphasia.   Data  Reviewed: Basic Metabolic Panel:  Recent Labs Lab 05/21/15 1255 05/23/15 2106 05/24/15 0532  NA 143 138 141  K 4.9 4.9 4.5  CL  --  110 114*  CO2 23 17* 18*  GLUCOSE 104 109* 91  BUN 19.1 20 18   CREATININE 1.3 1.58* 1.41*  CALCIUM 8.8 8.5* 8.1*   Liver Function Tests:  Recent Labs Lab 05/21/15 1255  AST 23  ALT 15  ALKPHOS 75  BILITOT <0.30  PROT 5.7*  ALBUMIN 2.5*   No results for input(s): LIPASE, AMYLASE in the last 168 hours. No results for input(s): AMMONIA in the last 168 hours. CBC:  Recent Labs Lab 05/21/15 1255 05/23/15 2106 05/24/15 0532 05/24/15 0955 05/25/15 0500  WBC 2.3* 3.5* 4.0 3.5* 4.0  NEUTROABS 1.6 2.5  --   --   --   HGB 8.8* 8.6* 8.2* 7.8* 7.7*  HCT 26.5* 27.2* 24.7* 24.2* 22.9*  MCV 84.9 86.1 86.1 85.5 85.8  PLT 341 307 265 252 227   Cardiac Enzymes:  Recent Labs Lab 05/23/15 2016 05/24/15 0602  TROPONINI 0.24* 0.42*   BNP (last 3 results)  Recent Labs  05/23/15 2106  BNP 120.4*    ProBNP (last 3 results) No results for input(s): PROBNP in the last 8760 hours.  CBG: No results for input(s): GLUCAP in the last 168 hours.  Recent Results (from the past 240 hour(s))  TECHNOLOGIST REVIEW     Status: None   Collection Time: 05/21/15 12:55 PM  Result Value Ref Range Status   Technologist Review   Final    Mk Poiks with Burrs, Acanthos, Fragments, and Helmets  Urine culture     Status: None (Preliminary result)   Collection Time: 05/23/15  9:31 PM  Result Value Ref Range Status   Specimen Description URINE, CATHETERIZED  Final   Special Requests NONE  Final   Culture >=100,000 COLONIES/mL GRAM NEGATIVE RODS  Final   Report Status PENDING  Incomplete  MRSA PCR Screening     Status: None   Collection Time: 05/24/15  3:01 AM  Result Value Ref Range Status   MRSA by PCR NEGATIVE NEGATIVE Final    Comment:        The GeneXpert MRSA Assay (FDA approved for NASAL specimens only), is one component of a comprehensive MRSA  colonization surveillance program. It is not intended to diagnose MRSA infection nor to guide or monitor treatment for MRSA infections.      Studies:  Recent x-ray studies have been reviewed in detail by the Attending Physician  Scheduled Meds:  Scheduled Meds: . antiseptic oral rinse  7 mL Mouth Rinse BID  . feeding supplement  1 Container Oral BID BM  . feeding supplement (PRO-STAT SUGAR FREE 64)  30 mL Oral BID BM  . pantoprazole (PROTONIX) IV  40 mg Intravenous QHS    Time spent on care of this patient: 40 mins   WOODS, Geraldo Docker , MD  [facility] Office  [phone] Pager - [phone]  On-Call/Text Page:      [email or web address]      password [email or web address]  If 7PM-7AM, please contact night-coverage [email or web address] Password [email or web address] 05/25/2015, 2:26 PM   LOS: 1 day   Care during the described time interval was provided by me .  I have reviewed this patient's  available data, including medical history, events of note, physical examination, and all test results as part of my evaluation. I have personally reviewed and interpreted all radiology studies.   Dia Crawford, MD [phone] Pager

## 2015-05-25 NOTE — Progress Notes (Signed)
Chief Complaint: Patient was seen in consultation today for placement of IVC filter at the request of Dr. Elsworth Soho  Referring Physician(s): Dr. Elsworth Soho  History of Present Illness: Benjamin Lawson is a 81 y.o. male with gastric cancer and recent GI bleed. He developed SOB and has been admitted with bilateral PE. Has been started on heparin gtt. Not a lysis candidate due to recent GI bleed. LE duplex shows left soleal DVT, otherwise negative. Pt with high risk for recurrent GI bleed on heparin and certainly not a great candidate for long term anticoagulation. IR is asked to consider IVC filter placement. Chart, PMHx, meds, labs, imaging reviewed. Pt without c/o at present, no family in room  Past Medical History  Diagnosis Date  . Hypertension   . Hypercholesterolemia   . Chronic kidney disease (CKD), stage II (mild)   . Deafness in left ear   . Peripheral vascular disease   . Prostate cancer     Past Surgical History  Procedure Laterality Date  . Multiple tooth extractions  01/2015    "all my lower teeth"  . Transurethral resection of prostate  1990's  . Esophagogastroduodenoscopy N/A 03/28/2015    Procedure: ESOPHAGOGASTRODUODENOSCOPY (EGD);  Surgeon: Richmond Campbell, MD;  Location: [facility] ENDOSCOPY;  Service: Endoscopy;  Laterality: N/A;    Allergies: Review of patient's allergies indicates no known allergies.  Medications:  Current facility-administered medications:  .  0.9 %  sodium chloride infusion, 250 mL, Intravenous, PRN, Guy Begin, MD .  0.9 %  sodium chloride infusion, , Intravenous, Continuous, Kara Mead V, MD, Last Rate: 100 mL/hr at 05/25/15 0249 .  antiseptic oral rinse (CPC / CETYLPYRIDINIUM CHLORIDE 0.05%) solution 7 mL, 7 mL, Mouth Rinse, BID, Raylene Miyamoto, MD, 7 mL at 05/24/15 2300 .  feeding supplement (BOOST / RESOURCE BREEZE) liquid 1 Container, 1 Container, Oral, BID BM, Oswaldo Milian, RD, 1 Container at 05/24/15 1441 .  feeding supplement  (PRO-STAT SUGAR FREE 64) liquid 30 mL, 30 mL, Oral, BID BM, Oswaldo Milian, RD, 30 mL at 05/24/15 1441 .  heparin ADULT infusion 100 units/mL (25000 units/250 mL), 700 Units/hr, Intravenous, Continuous, Crystal Trellis Moment, [facility], Last Rate: 7 mL/hr at 05/25/15 0249, 700 Units/hr at 05/25/15 0249 .  Influenza vac split quadrivalent PF (FLUARIX) injection 0.5 mL, 0.5 mL, Intramuscular, Tomorrow-1000, Md Pccm, MD .  pantoprazole (PROTONIX) injection 40 mg, 40 mg, Intravenous, QHS, Guy Begin, MD, 40 mg at 05/24/15 2300    History reviewed. No pertinent family history.  Social History   Social History  . Marital Status: Widowed    Spouse Name: N/A  . Number of Children: N/A  . Years of Education: N/A   Social History Main Topics  . Smoking status: Former Smoker -- 0.00 packs/day for 0 years    Types: Cigarettes  . Smokeless tobacco: Never Used     Comment: "stopped smoking in the 1960's  . Alcohol Use: Yes  . Drug Use: No  . Sexual Activity: Not Asked   Other Topics Concern  . None   Social History Narrative    Review of Systems: A 12 point ROS discussed and pertinent positives are indicated in the HPI above.  All other systems are negative.  Review of Systems  Vital Signs: BP 105/72 mmHg  Pulse 86  Temp(Src) 98.6 F (37 C) (Oral)  Resp 14  Ht 5\' 11"  (1.803 m)  Wt 175 lb 4.3 oz (79.5 kg)  BMI 24.46 kg/m2  SpO2 100%  Physical Exam  Constitutional: He appears well-developed. No distress.  HENT:  Head: Normocephalic.  Mouth/Throat: Oropharynx is clear and moist.  Neck: Normal range of motion. No JVD present. No tracheal deviation present.  Cardiovascular:  Tachy regular  Pulmonary/Chest: Effort normal. No respiratory distress. He has no wheezes.  Abdominal: Soft. He exhibits no distension and no mass. There is no tenderness.  Neurological: He is alert.    Mallampati Score:  MD Evaluation Airway: WNL Heart: WNL Abdomen: WNL Chest/ Lungs: WNL ASA   Classification: 3 Mallampati/Airway Score: Two  Imaging: Dg Chest 2 View  05/23/2015   CLINICAL DATA:  Worsening shortness of breath and generalize weakness for several days. History of gastric cancer post chemotherapy.  EXAM: CHEST  2 VIEW  COMPARISON:  05/08/2015  FINDINGS: Normal heart size and pulmonary vascularity. Chronic elevation of the left hemidiaphragm with prominent gas-filled dilated colon. Gaseous distention of the colon is similar to prior study and suggests colonic ileus. Atelectasis in the left lung base. Right lung appears clear and expanded. Power port type central venous catheter with tip over the low SVC region. Calcified and tortuous aorta. Degenerative changes in the spine and shoulders.  IMPRESSION: Chronic elevation of the left hemidiaphragm with atelectasis in the left lung base. No evidence of active pulmonary disease. Again demonstrated is prominent gaseous distention of the colon suggesting colonic ileus.   Electronically Signed   By: Lucienne Capers M.D.   On: 05/23/2015 22:13   Dg Chest 2 View  05/08/2015   CLINICAL DATA:  Shortness of breath. Cough and chest congestion. Gastric cancer.  EXAM: CHEST  2 VIEW  COMPARISON:  Chest x-rays dated 04/02/2015 and 03/27/2015  FINDINGS: Heart size and pulmonary vascularity are normal. Power port in good position. Chronic elevation the left hemidiaphragm. Gaseous distention of the colon. No osseous abnormality.  IMPRESSION: No acute disease in the chest. Chronic elevation of the left hemidiaphragm with gaseous distention of the colon, slightly more prominent than on prior exams.   Electronically Signed   By: Lorriane Shire M.D.   On: 05/08/2015 14:34   Ct Angio Chest Pe W/cm &/or Wo Cm  05/23/2015   CLINICAL DATA:  Extreme shortness of breath in a patient who is had recent pulmonary embolus. Hypoxia, tachycardia, shortness of breath, history of gastric cancer and prostate cancer.  EXAM: CT ANGIOGRAPHY CHEST WITH CONTRAST  TECHNIQUE:  Multidetector CT imaging of the chest was performed using the standard protocol during bolus administration of intravenous contrast. Multiplanar CT image reconstructions and MIPs were obtained to evaluate the vascular anatomy.  CONTRAST:  67mL OMNIPAQUE IOHEXOL 350 MG/ML SOLN  COMPARISON:  None.  FINDINGS: Technically adequate study with good opacification of the central and segmental pulmonary arteries. Multiple filling defects are demonstrated in the distal right and left main pulmonary arteries with extension into bilateral upper and lower lobe segmental branches. RV to LV ratio is 1.4 suggesting possible right heart strain.  Normal heart size. Normal caliber thoracic aorta. No evidence of aortic dissection. Calcification in the aorta and coronary arteries. Great vessel origins are patent. Esophagus is decompressed. Prominent lymph nodes in the right paratracheal and pretracheal region without pathologic enlargement.  Elevation of the left hemidiaphragm. Small left pleural effusion with atelectasis or consolidation in the left lung base. Emphysematous changes in the lungs. Scarring in the lung apices. No pneumothorax.  Included portions of the upper lungs demonstrate gastric wall thickening in a decompressed stomach. This may correlate with patient's known  history of gastric carcinoma. Spleen is atrophic. Colon is distended. Degenerative changes in the spine. No destructive bone lesions.  Review of the MIP images confirms the above findings.  IMPRESSION: Examination is positive for multiple bilateral pulmonary emboli. Increased RV to LV ratio suggest possibility of right heart strain. Elevation of left hemidiaphragm with small left pleural effusion and atelectasis or consolidation in the left lung base. Gaseous distention of the colon. Gastric wall thickening may correspond to history of gastric carcinoma.  These results were called by telephone at the time of interpretation on 05/23/2015 at 11:50 pm to Dr. Antony Blackbird , who verbally acknowledged these results.   Electronically Signed   By: Lucienne Capers M.D.   On: 05/23/2015 23:53    Labs:  CBC:  Recent Labs  05/23/15 2106 05/24/15 0532 05/24/15 0955 05/25/15 0500  WBC 3.5* 4.0 3.5* 4.0  HGB 8.6* 8.2* 7.8* 7.7*  HCT 27.2* 24.7* 24.2* 22.9*  PLT 307 265 252 227    COAGS:  Recent Labs  03/27/15 1442 03/28/15 0427  INR 1.02 1.08  APTT  --  26    BMP:  Recent Labs  04/02/15 0336 04/03/15 0311  05/15/15 1029 05/21/15 1255 05/23/15 2106 05/24/15 0532  NA 139 138  < > 142 143 138 141  K 4.4 4.8  < > 4.4 4.9 4.9 4.5  CL 113* 111  --   --   --  110 114*  CO2 21* 22  < > 23 23 17* 18*  GLUCOSE 95 106*  < > 171* 104 109* 91  BUN 20 16  < > 17.7 19.1 20 18   CALCIUM 7.9* 8.1*  < > 8.4 8.8 8.5* 8.1*  CREATININE 1.28* 1.34*  < > 1.2 1.3 1.58* 1.41*  GFRNONAA 51* 48*  --   --   --  40* 45*  GFRAA 59* 56*  --   --   --  46* 53*  < > = values in this interval not displayed.  LIVER FUNCTION TESTS:  Recent Labs  05/01/15 1032 05/08/15 1319 05/15/15 1029 05/21/15 1255  BILITOT 0.26 0.41 0.23 <0.30  AST 19 19 21 23   ALT 14 16 14 15   ALKPHOS 62 71 65 75  PROT 5.0* 5.0* 5.4* 5.7*  ALBUMIN 2.0* 2.1* 2.3* 2.5*    TUMOR MARKERS:  Recent Labs  04/01/15 1528  CEA 12.2*    Assessment and Plan: Bilateral PE and LE DVT Gastric cancer with recent GI bleed High risk for future thromboembolic events, also high risk for GI bleed with anticoagulation Good indication for IVC filter placement Baseline CKD, stage II Discussed plan for IVC filter, including procedure, risks, complications with daughter. Explained device is retrievable, but in this setting, may be permanent. Labs reviewed Daughter to sign consent, plan for procedure tomorrow Discussed with ordering MD, Dr. Elsworth Soho    Signed: Ascencion Dike 05/25/2015, 8:52 AM   I spent a total of 20 Minutes  in face to face in clinical consultation, greater than 50% of  which was counseling/coordinating care for IVC filter placement

## 2015-05-25 NOTE — Progress Notes (Addendum)
ANTICOAGULATION CONSULT NOTE - Follow up Pharmacy Consult for heparin Indication: pulmonary embolus  No Known Allergies  Patient Measurements: Height: 5\' 11"  (180.3 cm) Weight: 175 lb 4.3 oz (79.5 kg) IBW/kg (Calculated) : 75.3  Heparin dosing weight: 76.9 kg  Vital Signs: Temp: 97.8 F (36.6 C) (09/25 1125) Temp Source: Other (Comment) (09/25 1125) BP: 129/101 mmHg (09/25 1125) Pulse Rate: 86 (09/25 0600)  Labs:  Recent Labs  05/23/15 2016  05/23/15 2106 05/24/15 0532 05/24/15 0602  05/24/15 0955 05/24/15 1928 05/25/15 0500 05/25/15 1100  HGB  --   < > 8.6* 8.2*  --   --  7.8*  --  7.7*  --   HCT  --   < > 27.2* 24.7*  --   --  24.2*  --  22.9*  --   PLT  --   < > 307 265  --   --  252  --  227  --   HEPARINUNFRC  --   --   --   --   --   < > 0.59 0.77* 0.40 0.38  CREATININE  --   --  1.58* 1.41*  --   --   --   --   --   --   TROPONINI 0.24*  --   --   --  0.42*  --   --   --   --   --   < > = values in this interval not displayed.  Estimated Creatinine Clearance: 44.5 mL/min (by C-G formula based on Cr of 1.41).   Medical History: Past Medical History  Diagnosis Date  . Hypertension   . Hypercholesterolemia   . Chronic kidney disease (CKD), stage II (mild)   . Deafness in left ear   . Peripheral vascular disease   . Prostate cancer      [name] c/o worsening SOB and generalized weakness, CT shows multiple bilateral PE w/ possible right heart strain, Began IV heparin infusion on 9/24 AM. Of note, pt had recent GIB in July 2016, admit Hgb low at 8.6. EDP spoke w/ GI and determined that benefit of heparin outweighs risk. We will aim for lower goal at least at initiation of therapy.    Anticoag:  multiple bilateral PE, +DVT. Lower goal  HL 0.3-0.5  due to recent GIB 02/2015. Today HL = 0.38 on heparin drip 700 units/hr.  HL remains therapeutic. Hgb has dropped to 7.7 (8.2>7.8>7.7), PLTC wnl at 227K. No bleeding noted. MD noted patient likely   hypercoagulable secondary to malignancy (recent diagnosis of gastric cancer). Not a candidate for lytic therapy due to recent GI bleed. CCM notes Hgb trending down , doubts he is a candidate for long term anticoagulation. Radiologist consulted and plan is for IVC filter  procedure tomorrow.     Goal of Therapy:  Heparin level 0.3-0.5 units/ml Monitor platelets by anticoagulation protocol: Yes   Plan:  Continue IV heparin at 700 units/hr Daily heparin level and CBC. Plan is for IVC filter  procedure tomorrow.    Nicole Cella, RPh Clinical Pharmacist Pager: [phone]  05/25/2015,1:23 PM

## 2015-05-26 ENCOUNTER — Telehealth: Payer: Self-pay | Admitting: *Deleted

## 2015-05-26 ENCOUNTER — Inpatient Hospital Stay (HOSPITAL_COMMUNITY): Payer: Medicare Other

## 2015-05-26 HISTORY — PX: IVC FILTER PLACEMENT (ARMC HX): HXRAD1551

## 2015-05-26 LAB — CBC
HEMATOCRIT: 26.7 % — AB (ref 39.0–52.0)
Hemoglobin: 8.8 g/dL — ABNORMAL LOW (ref 13.0–17.0)
MCH: 27.9 pg (ref 26.0–34.0)
MCHC: 33 g/dL (ref 30.0–36.0)
MCV: 84.8 fL (ref 78.0–100.0)
PLATELETS: 223 10*3/uL (ref 150–400)
RBC: 3.15 MIL/uL — ABNORMAL LOW (ref 4.22–5.81)
RDW: 18 % — AB (ref 11.5–15.5)
WBC: 4.3 10*3/uL (ref 4.0–10.5)

## 2015-05-26 LAB — HEPARIN LEVEL (UNFRACTIONATED)
HEPARIN UNFRACTIONATED: 0.28 [IU]/mL — AB (ref 0.30–0.70)
Heparin Unfractionated: 0.5 IU/mL (ref 0.30–0.70)

## 2015-05-26 LAB — URINE CULTURE: Culture: >=100000

## 2015-05-26 MED ORDER — MIDAZOLAM HCL 2 MG/2ML IJ SOLN
INTRAMUSCULAR | Status: AC
  Filled 2015-05-26: qty 2

## 2015-05-26 MED ORDER — OXYBUTYNIN CHLORIDE ER 10 MG PO TB24
10.0000 mg | ORAL_TABLET | Freq: Every day | ORAL | Status: DC
  Administered 2015-05-26 – 2015-05-29 (×4): 10 mg via ORAL
  Filled 2015-05-26 (×6): qty 1

## 2015-05-26 MED ORDER — MIDAZOLAM HCL 2 MG/2ML IJ SOLN
INTRAMUSCULAR | Status: AC | PRN
  Administered 2015-05-26: 1 mg via INTRAVENOUS

## 2015-05-26 MED ORDER — FENTANYL CITRATE (PF) 100 MCG/2ML IJ SOLN
INTRAMUSCULAR | Status: AC
  Filled 2015-05-26: qty 2

## 2015-05-26 MED ORDER — IOHEXOL 300 MG/ML  SOLN: 100.0000 mL | Freq: Once | INTRAMUSCULAR | Status: DC | PRN

## 2015-05-26 MED ORDER — SODIUM CHLORIDE 0.9 % IV BOLUS (SEPSIS)
1000.0000 mL | Freq: Once | INTRAVENOUS | Status: AC
  Administered 2015-05-26: 1000 mL via INTRAVENOUS

## 2015-05-26 MED ORDER — METOPROLOL TARTRATE 1 MG/ML IV SOLN
2.5000 mg | Freq: Once | INTRAVENOUS | Status: AC
  Administered 2015-05-26: 2.5 mg via INTRAVENOUS
  Filled 2015-05-26: qty 5

## 2015-05-26 MED ORDER — METOPROLOL TARTRATE 1 MG/ML IV SOLN: 5.0000 mg | Freq: Once | INTRAVENOUS | Status: DC

## 2015-05-26 MED ORDER — LIDOCAINE HCL 1 % IJ SOLN
INTRAMUSCULAR | Status: AC
  Filled 2015-05-26: qty 20

## 2015-05-26 MED ORDER — FENTANYL CITRATE (PF) 100 MCG/2ML IJ SOLN
INTRAMUSCULAR | Status: AC | PRN
  Administered 2015-05-26: 50 ug via INTRAVENOUS

## 2015-05-26 NOTE — Telephone Encounter (Signed)
Message from pt's daughter to inform Dr. Benay Spice that he has been admitted to [facility] with bilateral PE.

## 2015-05-26 NOTE — Progress Notes (Signed)
[place] TEAM 1 - Stepdown/ICU TEAM PROGRESS NOTE  Benjamin Lawson MDY:[id number] DOB: 05/01/1933 DOA: 05/23/2015 PCP: Donnajean Lopes, MD  Admit HPI / Brief Narrative: 81 y/o man with Hx HTN, CKD, Prostate Cancer, gastric cancer S/P XRT + 3 of 5 chemotherapy treatments (taxol and carboplatinin) who was discharged from a rehab facility the afternoon of presentation where he was recovering from a recent hospitalization for GI bleed, during which he was diagnosed with gastric cancer. His daughter, who is a Marine scientist, picked him up from the rehab facility and as they were walking from the car to her home he became acutely short of breath. She found that he was hypoxemic and tachycardic at which time she called 911. A CTA done on arrival showed bilateral PEs. [name] has no past history of VTE. Due to a decline in his functional status his Oncologist is not planning any further chemotherapy. Of note, on presentation last month he was found to have frank melena (this is what prompted the EGD that revealed his gastric cancer).   HPI/Subjective: Resting comfortably in bed.  Just had IVC filter placed.  Denies chest pain shortness breath fevers chills nausea or vomiting.  Assessment/Plan:  Submassive Bilateral PE - DVT  On heparin gtt - if does not have recurrence of GI bleed will transition to lovenox - not a candidate for lytic therapy due to recent GI bleed - will likely need lifelong anticoagulation with lovenox, but this is questionable given recent history of significant bleeding and potential for this to recur - IVC filter placed  today - plan to continue IV anticoagulation for now and monitor hemoglobin closely before transitioning to Lovenox or deciding to stop anticoagulation   Pulmonary hypertension no specific treatment indicated beyond treatment of PE  at present time  Diastolic dysfunction appears well compensated at present  Acute renal insult gently hydrate - appears to be  slowly improving   Gastric cancer - recent GI bleed Monitor closely for melena  E coli UTI Urine cx >100k colonies - amp resistant - rocephin senstive   Code Status: DNR - NO CODE  Family Communication: spoke with daughter at bedside Disposition Plan: SDU   Consultants: PCCM IR  Procedures: 9/24 TTE EF 65%-70% - (grade 1 diastolic dysfunction) - Pulmonary arteries: PA peak pressure: 53 mm Hg (S).   Antibiotics: Rocephin 9/23 + 9/26 >  DVT prophylaxis: IV heparin   Objective: Blood pressure 107/64, pulse 107, temperature 98.2 F (36.8 C), temperature source Oral, resp. rate 14, height 5\' 11"  (1.803 m), weight 82.555 kg (182 lb), SpO2 98 %.  Intake/Output Summary (Last 24 hours) at 05/26/15 1411 Last data filed at 05/26/15 1300  Gross per 24 hour  Intake   2449 ml  Output    585 ml  Net   1864 ml    Exam: General: No acute respiratory distress Lungs: Clear to auscultation bilaterally without wheezes or crackles Cardiovascular: Regular rate and rhythm without murmur gallop or rub normal S1 and S2 Abdomen: Nontender, nondistended, soft, bowel sounds positive, no rebound, no ascites, no appreciable mass Extremities: No significant cyanosis, clubbing, or edema bilateral lower extremities  Data Reviewed: Basic Metabolic Panel:  Recent Labs Lab 05/21/15 1255 05/23/15 2106 05/24/15 0532  NA 143 138 141  K 4.9 4.9 4.5  CL  --  110 114*  CO2 23 17* 18*  GLUCOSE 104 109* 91  BUN 19.1 20 18   CREATININE 1.3 1.58* 1.41*  CALCIUM 8.8 8.5* 8.1*  CBC:  Recent Labs Lab 05/21/15 1255 05/23/15 2106 05/24/15 0532 05/24/15 0955 05/25/15 0500 05/26/15 0415  WBC 2.3* 3.5* 4.0 3.5* 4.0 4.3  NEUTROABS 1.6 2.5  --   --   --   --   HGB 8.8* 8.6* 8.2* 7.8* 7.7* 8.8*  HCT 26.5* 27.2* 24.7* 24.2* 22.9* 26.7*  MCV 84.9 86.1 86.1 85.5 85.8 84.8  PLT 341 307 265 252 227 223    Liver Function Tests:  Recent Labs Lab 05/21/15 1255  AST 23  ALT 15  ALKPHOS 75    BILITOT <0.30  PROT 5.7*  ALBUMIN 2.5*    Cardiac Enzymes:  Recent Labs Lab 05/23/15 2016 05/24/15 0602  TROPONINI 0.24* 0.42*    Recent Results (from the past 240 hour(s))  TECHNOLOGIST REVIEW     Status: None   Collection Time: 05/21/15 12:55 PM  Result Value Ref Range Status   Technologist Review   Final    Mk Poiks with Burrs, Acanthos, Fragments, and Helmets  Urine culture     Status: None   Collection Time: 05/23/15  9:31 PM  Result Value Ref Range Status   Specimen Description URINE, CATHETERIZED  Final   Special Requests NONE  Final   Culture >=100,000 COLONIES/mL ESCHERICHIA COLI  Final   Report Status 05/26/2015 FINAL  Final   Organism ID, Bacteria ESCHERICHIA COLI  Final      Susceptibility   Escherichia coli - MIC*    AMPICILLIN >=32 RESISTANT Resistant     CEFAZOLIN <=4 SENSITIVE Sensitive     CEFTRIAXONE <=1 SENSITIVE Sensitive     CIPROFLOXACIN <=0.25 SENSITIVE Sensitive     GENTAMICIN <=1 SENSITIVE Sensitive     IMIPENEM <=0.25 SENSITIVE Sensitive     NITROFURANTOIN <=16 SENSITIVE Sensitive     TRIMETH/SULFA >=320 RESISTANT Resistant     AMPICILLIN/SULBACTAM >=32 RESISTANT Resistant     PIP/TAZO <=4 SENSITIVE Sensitive     * >=100,000 COLONIES/mL ESCHERICHIA COLI  MRSA PCR Screening     Status: None   Collection Time: 05/24/15  3:01 AM  Result Value Ref Range Status   MRSA by PCR NEGATIVE NEGATIVE Final    Comment:        The GeneXpert MRSA Assay (FDA approved for NASAL specimens only), is one component of a comprehensive MRSA colonization surveillance program. It is not intended to diagnose MRSA infection nor to guide or monitor treatment for MRSA infections.      Studies:   Recent x-ray studies have been reviewed in detail by the Attending Physician  Scheduled Meds:  Scheduled Meds: . antiseptic oral rinse  7 mL Mouth Rinse BID  . feeding supplement  1 Container Oral BID BM  . feeding supplement (PRO-STAT SUGAR FREE 64)  30 mL  Oral BID BM  . fentaNYL      . lidocaine      . midazolam      . pantoprazole (PROTONIX) IV  40 mg Intravenous QHS    Time spent on care of this patient: 35 mins   Noele Icenhour T , MD   [facility] Office  [phone] Pager - Text Page per Shea Evans as per below:  On-Call/Text Page:      [email or web address]      password [email or web address]  If 7PM-7AM, please contact night-coverage [email or web address] Password [email or web address] 05/26/2015, 2:11 PM   LOS: 2 days

## 2015-05-26 NOTE — Progress Notes (Signed)
Patient is alert and oriented to self and place but disoriented to situation and time. He has no complaints of pain.

## 2015-05-26 NOTE — Progress Notes (Signed)
ANTICOAGULATION CONSULT NOTE - Follow up  Pharmacy Consult for heparin Indication: pulmonary embolus  No Known Allergies  Patient Measurements: Height: 5\' 11"  (180.3 cm) Weight: 182 lb (82.555 kg) IBW/kg (Calculated) : 75.3  Heparin dosing weight: 76.9 kg  Vital Signs: Temp: 98.2 F (36.8 C) (09/26 1113) Temp Source: Oral (09/26 1113) BP: 107/64 mmHg (09/26 1406) Pulse Rate: 107 (09/26 1406)  Labs:  Recent Labs  05/23/15 2016  05/23/15 2106 05/24/15 0532 05/24/15 0602 05/24/15 0955  05/25/15 0500 05/25/15 1100 05/26/15 0415  HGB  --   < > 8.6* 8.2*  --  7.8*  --  7.7*  --  8.8*  HCT  --   < > 27.2* 24.7*  --  24.2*  --  22.9*  --  26.7*  PLT  --   < > 307 265  --  252  --  227  --  223  HEPARINUNFRC  --   --   --   --   --  0.59  < > 0.40 0.38 0.28*  CREATININE  --   --  1.58* 1.41*  --   --   --   --   --   --   TROPONINI 0.24*  --   --   --  0.42*  --   --   --   --   --   < > = values in this interval not displayed.  Estimated Creatinine Clearance: 44.5 mL/min (by C-G formula based on Cr of 1.41).   [name] continuing on heparin for multiple bilateral PE w/ possible right heart strain, +DVT. Lower HL goal (0.3-0.5 with recent GIB in July 2016.   HL = 0.28 (subtherapeutic) on heparin drip 700 units/hr.  Hgb improved to 8.8, PLTC wnl. No bleeding noted. MD noted patient likely  hypercoagulable secondary to malignancy (recent diagnosis of gastric cancer). Not a candidate for lytic therapy due to recent GI bleed.  S/p IVC Filter this afternoon - Per RN - IR did not mention heparin drip being turned off at any point during procedure. Per Dr. Thereasa Solo - to continue heparin for now and f/u transition to lovenox long-term if no recurrence in GIB. Will check HL this afternoon after rate increase this AM.  Goal of Therapy:  Heparin level 0.3-0.5 units/ml Monitor platelets by anticoagulation protocol: Yes   Plan:  IV heparin at 800 units/hr 8h HL Daily  HL/CBC Monitor closely for melena (Gastric cancer - recent GI bleed) Transition to lovenox long-term if no GIB recurrence  Elicia Lamp, PharmD Clinical Pharmacist Pager [phone] 05/26/2015 3:03 PM

## 2015-05-26 NOTE — Telephone Encounter (Signed)
Voicemail from patient's daughter Carlyon Shadow.  Courtesy call in case the hospital hasn't notified Dr. Benay Spice.  Karina is currently hospitalized on [facility] room 2H09C-01 on Friday."

## 2015-05-26 NOTE — Progress Notes (Signed)
ANTICOAGULATION CONSULT NOTE - Follow up  Pharmacy Consult for heparin Indication: pulmonary embolus  No Known Allergies  Patient Measurements: Height: 5\' 11"  (180.3 cm) Weight: 175 lb 4.3 oz (79.5 kg) IBW/kg (Calculated) : 75.3  Heparin dosing weight: 76.9 kg  Vital Signs: Temp: 97.9 F (36.6 C) (09/26 0000) Temp Source: Oral (09/26 0000) BP: 132/96 mmHg (09/26 0000) Pulse Rate: 136 (09/26 0000)  Labs:  Recent Labs  05/23/15 2016  05/23/15 2106 05/24/15 0532 05/24/15 0602 05/24/15 0955  05/25/15 0500 05/25/15 1100 05/26/15 0415  HGB  --   < > 8.6* 8.2*  --  7.8*  --  7.7*  --  8.8*  HCT  --   < > 27.2* 24.7*  --  24.2*  --  22.9*  --  26.7*  PLT  --   < > 307 265  --  252  --  227  --  223  HEPARINUNFRC  --   --   --   --   --  0.59  < > 0.40 0.38 0.28*  CREATININE  --   --  1.58* 1.41*  --   --   --   --   --   --   TROPONINI 0.24*  --   --   --  0.42*  --   --   --   --   --   < > = values in this interval not displayed.  Estimated Creatinine Clearance: 44.5 mL/min (by C-G formula based on Cr of 1.41).   [name] c/o worsening SOB and generalized weakness, CT shows multiple bilateral PE w/ possible right heart strain, Began IV heparin infusion on 9/24 AM. Of note, pt had recent GIB in July 2016, admit Hgb low at 8.6. EDP spoke w/ GI and determined that benefit of heparin outweighs risk. We will aim for lower goal at least at initiation of therapy.    Anticoag:  multiple bilateral PE, +DVT. Lower goal  HL 0.3-0.5  due to recent GIB 02/2015. Today HL = 0.28 (subtherapeutic) on heparin drip 700 units/hr.  Hgb improved to 8.8., PLTC wnl. No bleeding noted. MD noted patient likely  hypercoagulable secondary to malignancy (recent diagnosis of gastric cancer). Not a candidate for lytic therapy due to recent GI bleed. Radiologist consulted and plan is for IVC filter procedure today.   Goal of Therapy:  Heparin level 0.3-0.5 units/ml Monitor platelets by  anticoagulation protocol: Yes   Plan:  Increase IV heparin to 800 units/hr Plan is for IVC filter  procedure today  Sherlon Handing, PharmD, BCPS Clinical pharmacist, pager [phone] 05/26/2015,4:44 AM

## 2015-05-26 NOTE — Progress Notes (Signed)
Report called by radiology RN. Patient did well for procedure. Bp 107/64. Received 50 mcg of fentanyl and 2 mg of versed. Radiology RN will help transport patient to unit.

## 2015-05-26 NOTE — Sedation Documentation (Signed)
NS 100cc bolus given

## 2015-05-26 NOTE — Progress Notes (Signed)
ANTICOAGULATION CONSULT NOTE - Follow Up Consult  Pharmacy Consult for heparin Indication: pulmonary embolus  No Known Allergies  Patient Measurements: Height: 5\' 11"  (180.3 cm) Weight: 182 lb (82.555 kg) IBW/kg (Calculated) : 75.3 Heparin Dosing Weight: 76.9 kg  Vital Signs: Temp: 97 F (36.1 C) (09/26 1700) Temp Source: Axillary (09/26 1700) BP: 121/84 mmHg (09/26 1800) Pulse Rate: 113 (09/26 1800)  Labs:  Recent Labs  05/23/15 2016  05/23/15 2106 05/24/15 0532 05/24/15 0602 05/24/15 0955  05/25/15 0500 05/25/15 1100 05/26/15 0415 05/26/15 1830  HGB  --   < > 8.6* 8.2*  --  7.8*  --  7.7*  --  8.8*  --   HCT  --   < > 27.2* 24.7*  --  24.2*  --  22.9*  --  26.7*  --   PLT  --   < > 307 265  --  252  --  227  --  223  --   HEPARINUNFRC  --   --   --   --   --  0.59  < > 0.40 0.38 0.28* 0.50  CREATININE  --   --  1.58* 1.41*  --   --   --   --   --   --   --   TROPONINI 0.24*  --   --   --  0.42*  --   --   --   --   --   --   < > = values in this interval not displayed.  Estimated Creatinine Clearance: 44.5 mL/min (by C-G formula based on Cr of 1.41).   Medications:  Scheduled:  . antiseptic oral rinse  7 mL Mouth Rinse BID  . feeding supplement  1 Container Oral BID BM  . feeding supplement (PRO-STAT SUGAR FREE 64)  30 mL Oral BID BM  . fentaNYL      . lidocaine      . midazolam      . pantoprazole (PROTONIX) IV  40 mg Intravenous QHS   Infusions:  . sodium chloride 50 mL/hr at 05/25/15 0800  . heparin 800 Units/hr (05/26/15 0500)    Assessment: 81 yo male with PE is currently on therapeutic heparin.  Heparin level is 0.5  Goal of Therapy:  Heparin level 0.3-0.5 units/mL (due to recent GIB) Monitor platelets by anticoagulation protocol: Yes   Plan:  - continue heparin at 800 units/hr - confirm in 8 hours  So, Tsz-Yin 05/26/2015,7:03 PM

## 2015-05-26 NOTE — Procedures (Signed)
Interventional Radiology Procedure Note  Procedure:  IVC filter placement via right IJ vein.  Placed below lowest renal vein.  Complications: None Recommendations:  - Ok to shower tomorrow - Do not submerge for 7 days - Routine care  - candidate for retrieval if can be started on anti-coagulation.   Signed,  Dulcy Fanny. Earleen Newport, DO

## 2015-05-26 NOTE — Progress Notes (Signed)
Patient gone to IR for IVC placement. Daughter at bedside and will accompany patient down and wait in waiting area. Patient is alert, pleasant, and vitals stable.

## 2015-05-27 DIAGNOSIS — N390 Urinary tract infection, site not specified: Secondary | ICD-10-CM | POA: Diagnosis present

## 2015-05-27 DIAGNOSIS — I82402 Acute embolism and thrombosis of unspecified deep veins of left lower extremity: Secondary | ICD-10-CM

## 2015-05-27 DIAGNOSIS — N289 Disorder of kidney and ureter, unspecified: Secondary | ICD-10-CM

## 2015-05-27 DIAGNOSIS — B9620 Unspecified Escherichia coli [E. coli] as the cause of diseases classified elsewhere: Secondary | ICD-10-CM | POA: Diagnosis present

## 2015-05-27 DIAGNOSIS — D500 Iron deficiency anemia secondary to blood loss (chronic): Secondary | ICD-10-CM

## 2015-05-27 DIAGNOSIS — I5189 Other ill-defined heart diseases: Secondary | ICD-10-CM | POA: Diagnosis present

## 2015-05-27 DIAGNOSIS — M7989 Other specified soft tissue disorders: Secondary | ICD-10-CM | POA: Diagnosis present

## 2015-05-27 DIAGNOSIS — D6481 Anemia due to antineoplastic chemotherapy: Secondary | ICD-10-CM

## 2015-05-27 DIAGNOSIS — I2699 Other pulmonary embolism without acute cor pulmonale: Principal | ICD-10-CM

## 2015-05-27 DIAGNOSIS — C160 Malignant neoplasm of cardia: Secondary | ICD-10-CM

## 2015-05-27 LAB — RENAL FUNCTION PANEL
ALBUMIN: 1.8 g/dL — AB (ref 3.5–5.0)
Anion gap: 11 (ref 5–15)
BUN: 27 mg/dL — AB (ref 6–20)
CHLORIDE: 119 mmol/L — AB (ref 101–111)
CO2: 15 mmol/L — ABNORMAL LOW (ref 22–32)
CREATININE: 1.46 mg/dL — AB (ref 0.61–1.24)
Calcium: 7.2 mg/dL — ABNORMAL LOW (ref 8.9–10.3)
GFR, EST AFRICAN AMERICAN: 51 mL/min — AB (ref >60)
GFR, EST NON AFRICAN AMERICAN: 44 mL/min — AB (ref >60)
Glucose, Bld: 100 mg/dL — ABNORMAL HIGH (ref 65–99)
PHOSPHORUS: 3.7 mg/dL (ref 2.5–4.6)
POTASSIUM: 2.3 mmol/L — AB (ref 3.5–5.1)
Sodium: 145 mmol/L (ref 135–145)

## 2015-05-27 LAB — CBC
HCT: 24.2 % — ABNORMAL LOW (ref 39.0–52.0)
HEMOGLOBIN: 8.2 g/dL — AB (ref 13.0–17.0)
MCH: 28.6 pg (ref 26.0–34.0)
MCHC: 33.9 g/dL (ref 30.0–36.0)
MCV: 84.3 fL (ref 78.0–100.0)
PLATELETS: 180 10*3/uL (ref 150–400)
RBC: 2.87 MIL/uL — AB (ref 4.22–5.81)
RDW: 18.3 % — ABNORMAL HIGH (ref 11.5–15.5)
WBC: 4 10*3/uL (ref 4.0–10.5)

## 2015-05-27 LAB — HEPARIN LEVEL (UNFRACTIONATED): Heparin Unfractionated: 0.33 IU/mL (ref 0.30–0.70)

## 2015-05-27 LAB — C DIFFICILE QUICK SCREEN W PCR REFLEX
C DIFFICILE (CDIFF) TOXIN: NEGATIVE
C DIFFICLE (CDIFF) ANTIGEN: NEGATIVE
C Diff interpretation: NEGATIVE

## 2015-05-27 LAB — MAGNESIUM: MAGNESIUM: 1.2 mg/dL — AB (ref 1.7–2.4)

## 2015-05-27 MED ORDER — ENOXAPARIN SODIUM 80 MG/0.8ML ~~LOC~~ SOLN: 1.0000 mg/kg | Freq: Two times a day (BID) | SUBCUTANEOUS | Status: DC

## 2015-05-27 MED ORDER — DEXTROSE 5 % IV SOLN
1.0000 g | INTRAVENOUS | Status: DC
  Administered 2015-05-27 – 2015-05-28 (×2): 1 g via INTRAVENOUS
  Filled 2015-05-27 (×4): qty 10

## 2015-05-27 MED ORDER — POTASSIUM CHLORIDE CRYS ER 20 MEQ PO TBCR
40.0000 meq | EXTENDED_RELEASE_TABLET | ORAL | Status: AC
  Administered 2015-05-27 (×2): 40 meq via ORAL
  Filled 2015-05-27 (×2): qty 2

## 2015-05-27 MED ORDER — MAGNESIUM SULFATE 2 GM/50ML IV SOLN
2.0000 g | Freq: Once | INTRAVENOUS | Status: AC
  Administered 2015-05-27: 2 g via INTRAVENOUS
  Filled 2015-05-27: qty 50

## 2015-05-27 MED ORDER — HEPARIN (PORCINE) IN NACL 100-0.45 UNIT/ML-% IJ SOLN
800.0000 [IU]/h | INTRAMUSCULAR | Status: AC
  Administered 2015-05-27 (×2): 800 [IU]/h via INTRAVENOUS
  Filled 2015-05-27: qty 250

## 2015-05-27 NOTE — Progress Notes (Signed)
Report received 

## 2015-05-27 NOTE — Progress Notes (Signed)
IP PROGRESS NOTE  Subjective:   Benjamin Lawson was admitted 05/24/2015 with shortness of breath. His daughter alerted US of the hospital admission. He was diagnosed with a left lower chairman a DVT and bilateral pulmonary emboli. He is now maintained on heparin drip and underwent placement of an IVC filter 05/26/2015.  He has no specific complaint today.  Objective: Vital signs in last 24 hours: Blood pressure 116/75, pulse 98, temperature 98.8 F (37.1 C), temperature source Axillary, resp. rate 16, height 5\' 11"  (1.803 m), weight 180 lb 1.9 oz (81.7 kg), SpO2 98 %.  Intake/Output from previous day: 09/26 0701 - 09/27 0700 In: 1446.7 [P.O.:240; I.V.:1156.7; IV Piggyback:50] Out: 585 [Urine:585]  Physical Exam:  Lungs: Clear bilaterally Cardiac: Regular rate and rhythm Abdomen: Soft and nontender, no mass Extremities: No leg edema Neurologic: Alert, oriented to place. Answers inappropriately. Follows commands.  Portacath/PICC-without erythema  Lab Results:  Recent Labs  05/26/15 0415 05/27/15 0230  WBC 4.3 4.0  HGB 8.8* 8.2*  HCT 26.7* 24.2*  PLT 223 180    BMET  Recent Labs  05/27/15 0230  NA 145  K 2.3*  CL 119*  CO2 15*  GLUCOSE 100*  BUN 27*  CREATININE 1.46*  CALCIUM 7.2*    Studies/Results: Ir Ivc Filter Plmt / S&i /img Guid/mod Sed  05/26/2015   CLINICAL DATA:  81 year old male with a history of pulmonary embolism has developed bleeding on anti coagulation. Filter is indicated.  EXAM: 1. ULTRASOUND GUIDANCE FOR VASCULAR ACCESS OF THE RIGHT JUGULAR VEIN. 2. IVC VENOGRAM. 3. PERCUTANEOUS IVC FILTER PLACEMENT.  ANESTHESIA/SEDATION: 1.0 mg IV Versed; 50 mcg IV Fentanyl.  Total Moderate Sedation Time  FifteenMinutes.  CONTRAST:  15 cc  FLUOROSCOPY TIME:  2 minutes 30 seconds  PROCEDURE: The procedure, risks, benefits, and alternatives were explained to the patient. Questions regarding the procedure were encouraged and answered. The patient understands and  consents to the procedure.  The right neck was prepped with Betadine in a sterile fashion, and a sterile drape was applied covering the operative field. A sterile gown and sterile gloves were used for the procedure. Local anesthesia was provided with 1% Lidocaine.  Under direct ultrasound guidance, a 21 gauge needle was advanced into the right internal jugular vein with ultrasound image documentation performed.  The micro wire would not pass into the superior vena cava adjacent to an indwelling right-sided port catheter. Once the micro sheath was passed into the internal jugular vein, the wire and inner dilator were removed. A Glidewire was passed through the 3 [place] sheath, and was navigated adjacent to the port catheter through the right heart into the inferior vena cava. The 3 French catheter was removed and a 6 French sheath was placed over the Glidewire. An angled catheter was then passed into the IVC over the Glidewire and the Glidewire was exchanged for a Bentson wire.  Catheter and sheath were removed, and the [facility] deployment sheath was placed into the IVC.  Cavagram was performed. A Denali IVC filter was then advanced in the sheath. This was then fully deployed in the infrarenal IVC. Final filter position was confirmed with a fluoroscopic spot image. Contrast injection was also performed through the sheath under fluoroscopy to confirm patency of the IVC at the level of the filter. After the procedure the sheath was removed and hemostasis obtained with manual compression.  Patient tolerated the procedure well and remained hemodynamically stable throughout.  No complications were encountered and no significant blood loss was encountered.  COMPLICATIONS: None.  FINDINGS: IVC venography demonstrates a normal caliber IVC with no evidence of thrombus. Renal veins are identified bilaterally. The IVC filter was successfully positioned below the level of the renal veins and is appropriately oriented. This IVC  filter has both permanent and retrievable indications.  Occlusion of the right brachiocephalic vein adjacent to the port catheter.  IMPRESSION: Status post placement of IVC filter.  This IVC filter is potentially retrievable. The patient can be assessed for filter retrieval by Interventional Radiology in approximately 8-12 weeks. Further recommendations regarding filter retrieval, continued surveillance or declaration of device permanence, will be made at that time.  Signed,  Dulcy Fanny. Earleen Newport, DO  [facility]  [facility]   Electronically Signed   By: Corrie Mckusick D.O.   On: 05/26/2015 17:35    Medications: I have reviewed the patient's current medications.  Assessment/Plan: 1. Gastric cancer; EGD on 03/28/2015 which showed a near circumferential mass in the cardia. Biopsy showed adenocarcinoma. Staging CT scans showed diffuse wall thickening of the stomach; small nodular areas of soft tissue seen adjacent to the stomach; small hypoattenuating liver lesions.   Initiation of radiation 04/09/2015.   Initiation of weekly Taxol/carboplatin 04/17/2015.   Week 2 Taxol/carboplatin 04/24/2015.   Week 3 Taxol/carboplatin held due to neutropenia.   Week 4 Taxol/carboplatin 05/08/2015.   Week 5 held on 05/15/2015 due to neutropenia.   Radiation completed 05/20/2015. 2. History of GI bleeding secondary to #1 3. Anemia secondary to GI bleeding and chemotherapy/radiation 4. Renal insufficiency 5. Left lower extremity DVT and bilateral pulmonary embolism 05/24/2015-maintained on a heparin drip, IVC filter placed 05/26/2015  [name] has been diagnosed with gastric cancer. He completed a course of chemotherapy/radiation. He has a poor performance status. He is not a surgical candidate.  He has been diagnosed with a DVT and pulmonary embolism. An IVC filter was placed 05/26/2015. He presented with GI bleeding in August. There has been no report of  gross bleeding while he has undergone a course of radiation and weekly chemotherapy. He remains at significant risk of recurrent GI bleeding.  I agree with placement of the IVC filter. I would continue IV heparin anticoagulation for 5-7 days total and then consider discontinuing anticoagulation or placing him on low-dose Lovenox. I will be glad to discuss the situation with his daughter. He may need skilled nursing facility placement or home Hospice care.  I will check on him 05/29/2015. Please call as needed.   LOS: 3 days   SHERRILL, GARY  05/27/2015, 2:22 PM

## 2015-05-27 NOTE — Progress Notes (Signed)
Report attempted to [facility], whom stated she would call back.

## 2015-05-27 NOTE — Care Management Important Message (Signed)
Important Message  Patient Details  Name: Benjamin Lawson MRN: [id number] Date of Birth: [birth year]   Medicare Important Message Given:  Yes-second notification given    Delorse Lek 05/27/2015, 2:30 PM

## 2015-05-27 NOTE — Progress Notes (Signed)
[facility] TEAM 1 - Stepdown/ICU TEAM Progress Note  Benjamin Lawson XFG:[id number] DOB: 1932-10-05 DOA: 05/23/2015 PCP: Donnajean Lopes, MD  Admit HPI / Brief Narrative: 81 y/o man with PMHx HTN, CKD, Prostate Cancer, gastric cancer S/P XRT + 3 of 5 chemotherapy treatments (taxol and carboplatinin).   Discharged from a rehab facility the afternoon of presentation where he was recovering from a recent hospitalization for GI bleed, during which he was diagnosed with gastric cancer. His daughter, who is a Marine scientist, picked him up from his rehab facility and as they were walking from the car to her home, Benjamin Lawson became acutely short of breath. She measured his vital signs and found that he was hypoxemic and tachycardic at which time she called 911. A CTA done on arrival showed bilateral PEs. Benjamin Lawson has no past history of VTE. Due to a decline in his functional status his oncologist is not planning any further chemotherapy. Of note, on presentation last month he was found to have frank melena (this is what prompted the EGD that revealed his gastric cancer).   HPI/Subjective: 9/27 A/O 2 (does not know where, why), follows all commands. Patient was admitted~0800 by PCCM  Assessment/Plan: Submassive Bilateral PE/DVT -GI bleed stable -PE most likely secondary to patient's ongoing gastric cancer. -Continue heparin, transition over to Lovenox starting 9/29  Tachycardic -Resolved  Pulmonary hypertension -no specific treatment indicated beyond treatment of PE at present time  Diastolic dysfunction -well compensated at present  Hypokalemia -Potassium goal> 4 -K-Dur 40 mEq 2  Hypomagnesemia -Magnesium goal > 2  -Magnesium IV 2 gm   CKD -Slightly elevated but stable  -Continue gentle hydration normal saline 3ml/hr  Gastric cancer/GI bleed - Per Dr Ladell Pier (oncology) completed a course of chemotherapy/radiation -Patient not candidate for surgical  intervention -Monitor closely for melena  E coli UTI Urine cx >100k colonies - amp resistant - rocephin senstive   Palliative care  -Consulted palliative care patient has unrealistic expectations. SNF  vs hospice  vs home health  -Patient has expressed desire to go home unfortunately this by himself though states his son and daughter live across the street. Will await recommendations of palliative care   Code Status: FULL Family Communication: no family present at time of exam Disposition Plan: SNF vs palliative care vs hospice    Consultants: Dr Ladell Pier (oncology)   Procedure/Significant Events: 9/24 echocardiogram;- Left ventricle: mild LVH. -LVEF= 65%-70%.  -(grade 1 diastolic dysfunction). - Pulmonary arteries: PA peak pressure: 53 mm Hg (S).  9/26 IVC filter placement via right IJ vein    Culture 9/23 urine positive Escherichia coli 9/24 MRSA by PCR negative   Antibiotics: Rocephin 9/23 + 9/26 >   DVT prophylaxis:    Devices    LINES / TUBES:  9/26 IVC filter placement via right IJ vein    Continuous Infusions: . sodium chloride 30 mL/hr at 05/27/15 0700  . heparin 800 Units/hr (05/27/15 1900)    Objective: VITAL SIGNS: Temp: 98.8 F (37.1 C) (09/27 1417) Temp Source: Axillary (09/27 0800) BP: 116/75 mmHg (09/27 1417) Pulse Rate: 98 (09/27 1417) SPO2; FIO2:   Intake/Output Summary (Last 24 hours) at 05/27/15 1844 Last data filed at 05/27/15 0800  Gross per 24 hour  Intake 846.67 ml  Output    300 ml  Net 546.67 ml     Exam: General:A/O 2 (does not know where, why), follows all commands., No acute respiratory distress Eyes: Negative headache, negative scleral hemorrhage  ENT: Negative Runny nose, negative ear pain, negative gingival bleeding, Neck:  Negative scars, masses, torticollis, lymphadenopathy, JVD Lungs: Clear to auscultation on the right/LUL, decreased breath sounds LLL L without wheezes or crackles Cardiovascular:  Irregular regular rhythm and rate, without murmur gallop or rub normal S1 and S2, port present right upper chest wall negative signs of infection Abdomen:negative abdominal pain, nondistended, positive soft, bowel sounds, no rebound, no ascites, no appreciable mass Extremities: No significant cyanosis, clubbing, or edema bilateral lower extremities Psychiatric:  Negative depression, negative anxiety, negative fatigue, negative mania  Neurologic:  Cranial nerves II through XII intact, tongue/uvula midline, all extremities muscle strength 5/5, sensation intact negative dysarthria, negative expressive aphasia, negative receptive aphasia.   Data Reviewed: Basic Metabolic Panel:  Recent Labs Lab 05/21/15 1255 05/23/15 2106 05/24/15 0532 05/27/15 0230  NA 143 138 141 145  K 4.9 4.9 4.5 2.3*  CL  --  110 114* 119*  CO2 23 17* 18* 15*  GLUCOSE 104 109* 91 100*  BUN 19.1 20 18  27*  CREATININE 1.3 1.58* 1.41* 1.46*  CALCIUM 8.8 8.5* 8.1* 7.2*  MG  --   --   --  1.2*  PHOS  --   --   --  3.7   Liver Function Tests:  Recent Labs Lab 05/21/15 1255 05/27/15 0230  AST 23  --   ALT 15  --   ALKPHOS 75  --   BILITOT <0.30  --   PROT 5.7*  --   ALBUMIN 2.5* 1.8*   No results for input(s): LIPASE, AMYLASE in the last 168 hours. No results for input(s): AMMONIA in the last 168 hours. CBC:  Recent Labs Lab 05/21/15 1255  05/23/15 2106 05/24/15 0532 05/24/15 0955 05/25/15 0500 05/26/15 0415 05/27/15 0230  WBC 2.3*  < > 3.5* 4.0 3.5* 4.0 4.3 4.0  NEUTROABS 1.6  --  2.5  --   --   --   --   --   HGB 8.8*  < > 8.6* 8.2* 7.8* 7.7* 8.8* 8.2*  HCT 26.5*  < > 27.2* 24.7* 24.2* 22.9* 26.7* 24.2*  MCV 84.9  < > 86.1 86.1 85.5 85.8 84.8 84.3  PLT 341  < > 307 265 252 227 223 180  < > = values in this interval not displayed. Cardiac Enzymes:  Recent Labs Lab 05/23/15 2016 05/24/15 0602  TROPONINI 0.24* 0.42*   BNP (last 3 results)  Recent Labs  05/23/15 2106  BNP 120.4*     ProBNP (last 3 results) No results for input(s): PROBNP in the last 8760 hours.  CBG: No results for input(s): GLUCAP in the last 168 hours.  Recent Results (from the past 240 hour(s))  TECHNOLOGIST REVIEW     Status: None   Collection Time: 05/21/15 12:55 PM  Result Value Ref Range Status   Technologist Review   Final    Mk Poiks with Burrs, Acanthos, Fragments, and Helmets  Urine culture     Status: None   Collection Time: 05/23/15  9:31 PM  Result Value Ref Range Status   Specimen Description URINE, CATHETERIZED  Final   Special Requests NONE  Final   Culture >=100,000 COLONIES/mL ESCHERICHIA COLI  Final   Report Status 05/26/2015 FINAL  Final   Organism ID, Bacteria ESCHERICHIA COLI  Final      Susceptibility   Escherichia coli - MIC*    AMPICILLIN >=32 RESISTANT Resistant     CEFAZOLIN <=4 SENSITIVE Sensitive     CEFTRIAXONE <=1  SENSITIVE Sensitive     CIPROFLOXACIN <=0.25 SENSITIVE Sensitive     GENTAMICIN <=1 SENSITIVE Sensitive     IMIPENEM <=0.25 SENSITIVE Sensitive     NITROFURANTOIN <=16 SENSITIVE Sensitive     TRIMETH/SULFA >=320 RESISTANT Resistant     AMPICILLIN/SULBACTAM >=32 RESISTANT Resistant     PIP/TAZO <=4 SENSITIVE Sensitive     * >=100,000 COLONIES/mL ESCHERICHIA COLI  MRSA PCR Screening     Status: None   Collection Time: 05/24/15  3:01 AM  Result Value Ref Range Status   MRSA by PCR NEGATIVE NEGATIVE Final    Comment:        The GeneXpert MRSA Assay (FDA approved for NASAL specimens only), is one component of a comprehensive MRSA colonization surveillance program. It is not intended to diagnose MRSA infection nor to guide or monitor treatment for MRSA infections.   C difficile quick scan w PCR reflex     Status: None   Collection Time: 05/27/15 12:22 AM  Result Value Ref Range Status   C Diff antigen NEGATIVE NEGATIVE Final   C Diff toxin NEGATIVE NEGATIVE Final   C Diff interpretation Negative for toxigenic C. difficile  Final      Studies:  Recent x-ray studies have been reviewed in detail by the Attending Physician  Scheduled Meds:  Scheduled Meds: . antiseptic oral rinse  7 mL Mouth Rinse BID  . [START ON 05/29/2015] enoxaparin (LOVENOX) injection  1 mg/kg Subcutaneous Q12H  . feeding supplement  1 Container Oral BID BM  . feeding supplement (PRO-STAT SUGAR FREE 64)  30 mL Oral BID BM  . oxybutynin  10 mg Oral Daily  . pantoprazole (PROTONIX) IV  40 mg Intravenous QHS    Time spent on care of this patient: 40 mins   WOODS, Geraldo Docker , MD  [facility] Office  [phone] Pager - [phone]  On-Call/Text Page:      [email or web address]      password [email or web address]  If 7PM-7AM, please contact night-coverage [email or web address] Password [email or web address] 05/27/2015, 6:44 PM   LOS: 3 days   Care during the described time interval was provided by me .  I have reviewed this patient's available data, including medical history, events of note, physical examination, and all test results as part of my evaluation. I have personally reviewed and interpreted all radiology studies.   Dia Crawford, MD [phone] Pager

## 2015-05-27 NOTE — Progress Notes (Signed)
NURSING PROGRESS NOTE  [name] [id number] Transfer Data: 05/27/2015 2:21 PM Attending Provider: Allie Bossier, MD SMO:[id number],GBEEFE G, MD Code Status: Full  Allergies:  Review of patient's allergies indicates no known allergies. Past Medical History:   has a past medical history of Hypertension; Hypercholesterolemia; Chronic kidney disease (CKD), stage II (mild); Deafness in left ear; Peripheral vascular disease; and Prostate cancer. Past Surgical History:   has past surgical history that includes Multiple tooth extractions (01/2015); Transurethral resection of prostate (1990's); and Esophagogastroduodenoscopy (N/A, 03/28/2015). Social History:   reports that he has quit smoking. His smoking use included Cigarettes. He smoked 0.00 packs per day for 0 years. He has never used smokeless tobacco. He reports that he drinks alcohol. He reports that he does not use illicit drugs.  Benjamin Lawson is a 81 y.o. male patient transferred from [facility]>  Blood pressure 116/75, pulse 98, temperature 98.8 F (37.1 C), temperature source Axillary, resp. rate 16, height 5\' 11"  (1.803 m), weight 81.7 kg (180 lb 1.9 oz), SpO2 98 %.  Cardiac Monitoring: Box # 17 in place. Cardiac monitor yields:normal sinus rhythm.  IV Fluids:  IV in place, occlusive dsg intact without redness, IV cath wrist left, condition patent and no redness normal saline.   Skin: Stage 1 pressure ulcer sacrum, originally charted prior to [facility] [address] transfer.  Will continue to evaluate and treat per MD orders.   Hendricks Limes RN, BS, BSN

## 2015-05-27 NOTE — Progress Notes (Signed)
ANTICOAGULATION CONSULT NOTE - Follow Up Consult  Pharmacy Consult for heparin transitioned to lovenox Indication: pulmonary embolus  No Known Allergies  Patient Measurements: Height: 5\' 11"  (180.3 cm) Weight: 180 lb 1.9 oz (81.7 kg) IBW/kg (Calculated) : 75.3 Heparin Dosing Weight: 76.9 kg  Vital Signs: Temp: 98.8 F (37.1 C) (09/27 1417) Temp Source: Axillary (09/27 0800) BP: 116/75 mmHg (09/27 1417) Pulse Rate: 98 (09/27 1417)  Labs:  Recent Labs  05/25/15 0500  05/26/15 0415 05/26/15 1830 05/27/15 0230  HGB 7.7*  --  8.8*  --  8.2*  HCT 22.9*  --  26.7*  --  24.2*  PLT 227  --  223  --  180  HEPARINUNFRC 0.40  < > 0.28* 0.50 0.33  CREATININE  --   --   --   --  1.46*  < > = values in this interval not displayed.  Estimated Creatinine Clearance: 43 mL/min (by C-G formula based on Cr of 1.46).   Medications:  Scheduled:  . antiseptic oral rinse  7 mL Mouth Rinse BID  . enoxaparin (LOVENOX) injection  1 mg/kg Subcutaneous Q12H  . feeding supplement  1 Container Oral BID BM  . feeding supplement (PRO-STAT SUGAR FREE 64)  30 mL Oral BID BM  . oxybutynin  10 mg Oral Daily  . pantoprazole (PROTONIX) IV  40 mg Intravenous QHS   Infusions:  . sodium chloride 30 mL/hr at 05/27/15 0700    Assessment: 81 yo male with PE is currently on therapeutic heparin.  Heparin level down to 0.33 - low end of therapeutic. Hgb low but stable. Plt down to 180. No bleeding noted.  Pharmacy is consulted to transition IV heparin to lovenox for PE starting 9/29. Hgb 8.2, Plt wnl, sCr 1.5.  Pharmacy is consulted to dose ceftriaxone for UTI. Pt is afebrile, WBC wnl, sCr 1.5.  Goal of Therapy:  Heparin level 0.3-0.5 units/mL (due to recent GIB) Monitor platelets by anticoagulation protocol: Yes   Plan:  Continue heparin at 800 units/hr until 9/29 at 0900 Start lovenox 1mg /kg subcutaneously q12h on 9/29 at 1000 Daily HL/CBC  Start ceftriaxone 1g IV q24h Pharmacy will sign off of  antibiotic consult for now. Please re-consult if needed.  Andrey Cota. Diona Foley, PharmD Clinical Pharmacist Pager [phone]  05/27/2015,6:27 PM

## 2015-05-27 NOTE — Progress Notes (Signed)
CRITICAL VALUE ALERT  Critical value received:  Potassium 2.3  Date of notification:  05/27/2015  Time of notification:  4818  Critical value read back:Yes.    Nurse who received alert:  Hadassah Pais  MD notified (1st page):  NP Baltazar Najjar  Time of first page:  0424   New orders received from NP [facility]. Vicie Mutters, RN

## 2015-05-27 NOTE — Progress Notes (Signed)
ANTICOAGULATION CONSULT NOTE - Follow Up Consult  Pharmacy Consult for heparin Indication: pulmonary embolus  No Known Allergies  Patient Measurements: Height: 5\' 11"  (180.3 cm) Weight: 182 lb (82.555 kg) IBW/kg (Calculated) : 75.3 Heparin Dosing Weight: 76.9 kg  Vital Signs: Temp: 96.8 F (36 C) (09/27 0000) Temp Source: Axillary (09/27 0000) BP: 139/65 mmHg (09/27 0300) Pulse Rate: 97 (09/27 0300)  Labs:  Recent Labs  05/24/15 0532 05/24/15 0602  05/25/15 0500  05/26/15 0415 05/26/15 1830 05/27/15 0230  HGB 8.2*  --   < > 7.7*  --  8.8*  --  8.2*  HCT 24.7*  --   < > 22.9*  --  26.7*  --  24.2*  PLT 265  --   < > 227  --  223  --  180  HEPARINUNFRC  --   --   < > 0.40  < > 0.28* 0.50 0.33  CREATININE 1.41*  --   --   --   --   --   --   --   TROPONINI  --  0.42*  --   --   --   --   --   --   < > = values in this interval not displayed.  Estimated Creatinine Clearance: 44.5 mL/min (by C-G formula based on Cr of 1.41).   Medications:  Scheduled:  . antiseptic oral rinse  7 mL Mouth Rinse BID  . feeding supplement  1 Container Oral BID BM  . feeding supplement (PRO-STAT SUGAR FREE 64)  30 mL Oral BID BM  . oxybutynin  10 mg Oral Daily  . pantoprazole (PROTONIX) IV  40 mg Intravenous QHS   Infusions:  . sodium chloride 30 mL/hr at 05/26/15 2200  . heparin 800 Units/hr (05/26/15 0500)    Assessment: 81 yo male with PE is currently on therapeutic heparin.  Heparin level down to 0.33 - low end of therapeutic. Hgb low but stable. Plt down to 180. No bleeding noted.  Goal of Therapy:  Heparin level 0.3-0.5 units/mL (due to recent GIB) Monitor platelets by anticoagulation protocol: Yes   Plan:  - Continue heparin at 800 units/hr - F/u daily heparin level  Sherlon Handing, PharmD, BCPS Clinical pharmacist, pager [phone] 05/27/2015,3:29 AM

## 2015-05-28 ENCOUNTER — Telehealth: Payer: Self-pay

## 2015-05-28 DIAGNOSIS — Z515 Encounter for palliative care: Secondary | ICD-10-CM

## 2015-05-28 DIAGNOSIS — Z7189 Other specified counseling: Secondary | ICD-10-CM

## 2015-05-28 DIAGNOSIS — R531 Weakness: Secondary | ICD-10-CM

## 2015-05-28 LAB — BASIC METABOLIC PANEL
ANION GAP: 12 (ref 5–15)
BUN: 25 mg/dL — ABNORMAL HIGH (ref 6–20)
CALCIUM: 7.7 mg/dL — AB (ref 8.9–10.3)
CO2: 12 mmol/L — ABNORMAL LOW (ref 22–32)
Chloride: 124 mmol/L — ABNORMAL HIGH (ref 101–111)
Creatinine, Ser: 1.46 mg/dL — ABNORMAL HIGH (ref 0.61–1.24)
GFR, EST AFRICAN AMERICAN: 51 mL/min — AB (ref >60)
GFR, EST NON AFRICAN AMERICAN: 44 mL/min — AB (ref >60)
GLUCOSE: 82 mg/dL (ref 65–99)
POTASSIUM: 3.6 mmol/L (ref 3.5–5.1)
Sodium: 148 mmol/L — ABNORMAL HIGH (ref 135–145)

## 2015-05-28 LAB — CBC
HEMATOCRIT: 26.1 % — AB (ref 39.0–52.0)
HEMOGLOBIN: 8.7 g/dL — AB (ref 13.0–17.0)
MCH: 28 pg (ref 26.0–34.0)
MCHC: 33.3 g/dL (ref 30.0–36.0)
MCV: 83.9 fL (ref 78.0–100.0)
Platelets: 133 10*3/uL — ABNORMAL LOW (ref 150–400)
RBC: 3.11 MIL/uL — AB (ref 4.22–5.81)
RDW: 19 % — ABNORMAL HIGH (ref 11.5–15.5)
WBC: 4.8 10*3/uL (ref 4.0–10.5)

## 2015-05-28 LAB — HEPARIN LEVEL (UNFRACTIONATED): HEPARIN UNFRACTIONATED: 0.43 [IU]/mL (ref 0.30–0.70)

## 2015-05-28 LAB — MAGNESIUM: MAGNESIUM: 1.8 mg/dL (ref 1.7–2.4)

## 2015-05-28 MED ORDER — PANTOPRAZOLE SODIUM 40 MG PO TBEC
40.0000 mg | DELAYED_RELEASE_TABLET | Freq: Every day | ORAL | Status: DC
  Administered 2015-05-28: 40 mg via ORAL
  Filled 2015-05-28: qty 1

## 2015-05-28 MED ORDER — POTASSIUM CHLORIDE CRYS ER 20 MEQ PO TBCR
40.0000 meq | EXTENDED_RELEASE_TABLET | Freq: Once | ORAL | Status: AC
  Administered 2015-05-28: 40 meq via ORAL
  Filled 2015-05-28: qty 2

## 2015-05-28 MED ORDER — ENOXAPARIN SODIUM 120 MG/0.8ML ~~LOC~~ SOLN
110.0000 mg | SUBCUTANEOUS | Status: DC
  Administered 2015-05-29: 110 mg via SUBCUTANEOUS
  Filled 2015-05-28 (×2): qty 0.8

## 2015-05-28 NOTE — Clinical Social Work Note (Signed)
Clinical Social Work Assessment  Patient Details  Name: AIKEEM LILLEY MRN: [id number] Date of Birth: 05-23-1933  Date of referral:  05/28/15               Reason for consult:  Facility Placement                Permission sought to share information with:  Family Supports Permission granted to share information::  Yes, Verbal Permission Granted  Name::     Brendan Gadson, patient's daughter  Agency::  SNF admissions  Relationship::     Contact Information:     Housing/Transportation Living arrangements for the past 2 months:  Single Family Home Source of Information:  Patient, Adult Children Patient Interpreter Needed:  None Criminal Activity/Legal Involvement Pertinent to Current Situation/Hospitalization:  No - Comment as needed Significant Relationships:  Adult Children Lives with:  Self Do you feel safe going back to the place where you live?  Yes (Patient needs some short term rehab first before returning home) Need for family participation in patient care:  Yes (Comment) (Patient has some confusion at times)  Care giving concerns: Patient and daughter feel he needs some short term rehab before he can return home.  Social Worker assessment / plan:  Patient is an 81 year old male who lives by himself.  Patient expressed he has been rehab before and would like to return to [facility].  Patient requested CSW contact his daughter to discuss SNF placement and process.  Patient's daughter Carlyon Shadow is a Marine scientist and was concerned that patient needs some strengthening again before he can return home.  Patient's daughter expressed that she would like patient to gain his strength back before going home.  Patient's daughter was explained role and process of CSW in regards to trying to find placement.  Patient's daughter is hopeful that he can get his strength back up in order to return back home.  Patient's daughter did not have any other questions neither did patient.  Patient and  daughter agree to go to SNF for short term rehab.  Employment status:  Retired Health visitor, Managed Care PT Recommendations:  [place] / Referral to community resources:  [place]  Patient/Family's Response to care: Patient and daughter in agreement to going to SNF for rehab.  Patient/Family's Understanding of and Emotional Response to Diagnosis, Current Treatment, and Prognosis: Patient and daughter aware of prognosis and current treatment plan.  They did not express any other concerns, daughter would just like to be updated regularly by physician. Emotional Assessment Appearance:  Appears stated age Attitude/Demeanor/Rapport:    Affect (typically observed):  Calm, Appropriate, Quiet, Pleasant Orientation:  Oriented to Self, Oriented to Place, Oriented to  Time Alcohol / Substance use:  Alcohol Use Psych involvement (Current and /or in the community):  No (Comment)  Discharge Needs  Concerns to be addressed:  Lack of Support Readmission within the last 30 days:  No Current discharge risk:  Lives alone, Chronically ill Barriers to Discharge:  No Barriers Identified   Ross Ludwig, LCSWA 05/28/2015, 7:12 PM

## 2015-05-28 NOTE — Telephone Encounter (Signed)
Warnell Forester requested call back from Dr. Benay Spice.  Phone number [phone]

## 2015-05-28 NOTE — Progress Notes (Signed)
Progress Note  Benjamin Lawson QBV:[id number] DOB: 1933-02-06 DOA: 05/23/2015 PCP: Donnajean Lopes, MD  Admit HPI / Brief Narrative: 81 y/o man with PMHx HTN, CKD, Prostate Cancer, gastric cancer S/P XRT + 3 of 5 chemotherapy treatments (taxol and carboplatinin).   Discharged from a rehab facility the afternoon of presentation where he was recovering from a recent hospitalization for GI bleed, during which he was diagnosed with gastric cancer. His daughter, who is a Marine scientist, picked him up from his rehab facility and as they were walking from the car to her home, Benjamin Lawson became acutely short of breath. She measured his vital signs and found that he was hypoxemic and tachycardic at which time she called 911. A CTA done on arrival showed bilateral PEs. Benjamin Lawson has no past history of VTE. Due to a decline in his functional status his oncologist is not planning any further chemotherapy. Of note, on presentation last month he was found to have frank melena (this is what prompted the EGD that revealed his gastric cancer).   HPI/Subjective: 9/27 A/O 2 , follows all commands. Has no complaints.  Assessment/Plan: Submassive Bilateral PE/DVT -On heparin gtt - if does not have recurrence of GI bleed will transition to lovenox - not a candidate for lytic therapy due to recent GI bleed, PE most likely secondary to patient's ongoing gastric cancer. -Continue heparin, transition over to Lovenox starting 9/29 as hemoglobin remained stable,  will need lifelong Lovenox. - IVC filter placed 9/26  Tachycardic -Resolved  Pulmonary hypertension -no specific treatment indicated beyond treatment of PE at present time  Diastolic dysfunction -well compensated at present  Hypokalemia -Repleted  Hypomagnesemia -Repleted  CKD -Slightly elevated but stable  -Continue gentle hydration normal saline 29ml/hr  Gastric cancer/GI bleed - Per Dr Ladell Pier (oncology) completed a course of  chemotherapy/radiation -Patient not candidate for surgical intervention -Monitor closely for melena  E coli UTI Urine cx >100k colonies - amp resistant - rocephin senstive   Palliative care  -Consulted palliative care patient has unrealistic expectations. SNF  vs hospice  vs home health  -Patient has expressed desire to go home unfortunately this by himself though states his son and daughter live across the street. Will await recommendations of palliative care   Code Status: FULL Family Communication: no family present at time of exam Disposition Plan: SNF vs palliative care vs hospice    Consultants: Dr Ladell Pier (oncology)   Procedure/Significant Events: 9/24 echocardiogram;- Left ventricle: mild LVH. -LVEF= 65%-70%.  -(grade 1 diastolic dysfunction). - Pulmonary arteries: PA peak pressure: 53 mm Hg (S).  9/26 IVC filter placement via right IJ vein    Culture 9/23 urine positive Escherichia coli 9/24 MRSA by PCR negative   Antibiotics: Rocephin 9/23 + 9/26 >   DVT prophylaxis:    Devices    LINES / TUBES:  9/26 IVC filter placement via right IJ vein    Continuous Infusions: . sodium chloride 30 mL/hr at 05/27/15 0700  . heparin 800 Units/hr (05/27/15 2212)    Objective: VITAL SIGNS: Temp: 98.3 F (36.8 C) (09/28 0600) Temp Source: Oral (09/28 0600) BP: 122/68 mmHg (09/28 0548) Pulse Rate: 95 (09/28 0548) SPO2; FIO2:   Intake/Output Summary (Last 24 hours) at 05/28/15 1217 Last data filed at 05/28/15 0959  Gross per 24 hour  Intake 1439.37 ml  Output    700 ml  Net 739.37 ml     Exam: General:A/O 2 (does not know where, why), follows  all commands., No acute respiratory distress Eyes: Negative headache, negative scleral hemorrhage ENT: Negative Runny nose, negative ear pain, negative gingival bleeding, Neck:  Negative scars, masses, torticollis, lymphadenopathy, JVD Lungs: Clear to auscultation on the right/LUL, decreased breath  sounds LLL L without wheezes or crackles Cardiovascular: Irregular regular rhythm and rate, without murmur gallop or rub normal S1 and S2, port present right upper chest wall negative signs of infection Abdomen:negative abdominal pain, nondistended, positive soft, bowel sounds, no rebound, no ascites, no appreciable mass Extremities: No significant cyanosis, clubbing, or edema bilateral lower extremities Psychiatric:  Negative depression, negative anxiety, negative fatigue, negative mania  Neurologic:  Cranial nerves II through XII intact, tongue/uvula midline, all extremities muscle strength 5/5, sensation intact negative dysarthria, negative expressive aphasia, negative receptive aphasia.   Data Reviewed: Basic Metabolic Panel:  Recent Labs Lab 05/21/15 1255 05/23/15 2106 05/24/15 0532 05/27/15 0230 05/28/15 0756  NA 143 138 141 145 148*  K 4.9 4.9 4.5 2.3* 3.6  CL  --  110 114* 119* 124*  CO2 23 17* 18* 15* 12*  GLUCOSE 104 109* 91 100* 82  BUN 19.1 20 18  27* 25*  CREATININE 1.3 1.58* 1.41* 1.46* 1.46*  CALCIUM 8.8 8.5* 8.1* 7.2* 7.7*  MG  --   --   --  1.2* 1.8  PHOS  --   --   --  3.7  --    Liver Function Tests:  Recent Labs Lab 05/21/15 1255 05/27/15 0230  AST 23  --   ALT 15  --   ALKPHOS 75  --   BILITOT <0.30  --   PROT 5.7*  --   ALBUMIN 2.5* 1.8*   No results for input(s): LIPASE, AMYLASE in the last 168 hours. No results for input(s): AMMONIA in the last 168 hours. CBC:  Recent Labs Lab 05/21/15 1255 05/23/15 2106  05/24/15 0955 05/25/15 0500 05/26/15 0415 05/27/15 0230 05/28/15 0756  WBC 2.3* 3.5*  < > 3.5* 4.0 4.3 4.0 4.8  NEUTROABS 1.6 2.5  --   --   --   --   --   --   HGB 8.8* 8.6*  < > 7.8* 7.7* 8.8* 8.2* 8.7*  HCT 26.5* 27.2*  < > 24.2* 22.9* 26.7* 24.2* 26.1*  MCV 84.9 86.1  < > 85.5 85.8 84.8 84.3 83.9  PLT 341 307  < > 252 227 223 180 133*  < > = values in this interval not displayed. Cardiac Enzymes:  Recent Labs Lab  05/23/15 2016 05/24/15 0602  TROPONINI 0.24* 0.42*   BNP (last 3 results)  Recent Labs  05/23/15 2106  BNP 120.4*    ProBNP (last 3 results) No results for input(s): PROBNP in the last 8760 hours.  CBG: No results for input(s): GLUCAP in the last 168 hours.  Recent Results (from the past 240 hour(s))  TECHNOLOGIST REVIEW     Status: None   Collection Time: 05/21/15 12:55 PM  Result Value Ref Range Status   Technologist Review   Final    Mk Poiks with Burrs, Acanthos, Fragments, and Helmets  Urine culture     Status: None   Collection Time: 05/23/15  9:31 PM  Result Value Ref Range Status   Specimen Description URINE, CATHETERIZED  Final   Special Requests NONE  Final   Culture >=100,000 COLONIES/mL ESCHERICHIA COLI  Final   Report Status 05/26/2015 FINAL  Final   Organism ID, Bacteria ESCHERICHIA COLI  Final      Susceptibility  Escherichia coli - MIC*    AMPICILLIN >=32 RESISTANT Resistant     CEFAZOLIN <=4 SENSITIVE Sensitive     CEFTRIAXONE <=1 SENSITIVE Sensitive     CIPROFLOXACIN <=0.25 SENSITIVE Sensitive     GENTAMICIN <=1 SENSITIVE Sensitive     IMIPENEM <=0.25 SENSITIVE Sensitive     NITROFURANTOIN <=16 SENSITIVE Sensitive     TRIMETH/SULFA >=320 RESISTANT Resistant     AMPICILLIN/SULBACTAM >=32 RESISTANT Resistant     PIP/TAZO <=4 SENSITIVE Sensitive     * >=100,000 COLONIES/mL ESCHERICHIA COLI  MRSA PCR Screening     Status: None   Collection Time: 05/24/15  3:01 AM  Result Value Ref Range Status   MRSA by PCR NEGATIVE NEGATIVE Final    Comment:        The GeneXpert MRSA Assay (FDA approved for NASAL specimens only), is one component of a comprehensive MRSA colonization surveillance program. It is not intended to diagnose MRSA infection nor to guide or monitor treatment for MRSA infections.   C difficile quick scan w PCR reflex     Status: None   Collection Time: 05/27/15 12:22 AM  Result Value Ref Range Status   C Diff antigen NEGATIVE  NEGATIVE Final   C Diff toxin NEGATIVE NEGATIVE Final   C Diff interpretation Negative for toxigenic C. difficile  Final      Scheduled Meds:  Scheduled Meds: . antiseptic oral rinse  7 mL Mouth Rinse BID  . cefTRIAXone (ROCEPHIN)  IV  1 g Intravenous Q24H  . [START ON 05/29/2015] enoxaparin (LOVENOX) injection  110 mg Subcutaneous Q24H  . feeding supplement  1 Container Oral BID BM  . feeding supplement (PRO-STAT SUGAR FREE 64)  30 mL Oral BID BM  . oxybutynin  10 mg Oral Daily  . pantoprazole  40 mg Oral QHS    Time spent on care of this patient: 30 mins   ELGERGAWY, DAWOOD , MD  [facility] Office  [phone] Pager - [phone]  On-Call/Text Page:      [email or web address]      password [email or web address]  If 7PM-7AM, please contact night-coverage [email or web address] Password [facility] 05/28/2015, 12:17 PM   LOS: 4 days

## 2015-05-28 NOTE — Progress Notes (Signed)
ANTICOAGULATION CONSULT NOTE - Follow Up Consult  Pharmacy Consult for heparin transitioned to Lovenox (starting 9/29) Indication: pulmonary embolus  No Known Allergies  Labs:  Recent Labs  05/26/15 0415 05/26/15 1830 05/27/15 0230 05/28/15 0756 05/28/15 1010  HGB 8.8*  --  8.2* 8.7*  --   HCT 26.7*  --  24.2* 26.1*  --   PLT 223  --  180 133*  --   HEPARINUNFRC 0.28* 0.50 0.33  --  0.43  CREATININE  --   --  1.46* 1.46*  --     Estimated Creatinine Clearance: 43 mL/min (by C-G formula based on Cr of 1.46).  Assessment: 81 yo male with PE is currently on therapeutic heparin.   Pharmacy is consulted to transition IV heparin to lovenox for PE starting 9/29.   Goal of Therapy:  Heparin level 0.3-0.5 units/mL (due to recent GIB) Monitor platelets by anticoagulation protocol: Yes   Plan:  Continue heparin at 800 units/hr until 9/29 at 0900 Start lovenox 1.5 mg / kg (=110 mg) sq Q 24 hours starting 9/29 at 9 am  Thank you Anette Guarneri, PharmD [phone] 05/28/2015,10:56 AM

## 2015-05-28 NOTE — Consult Note (Signed)
Consultation Note Date: 05/28/2015   Patient Name: Benjamin Lawson  DOB: [birth year]  MRN: [id number]  Age / Sex: 81 y.o., male   PCP: Benjamin Battles, MD Referring Physician: Albertine Patricia, MD  Reason for Consultation: Establishing goals of care, Non pain symptom management, Pain control and Psychosocial/spiritual support  Palliative Care Assessment and Plan Summary of Established Goals of Care and Medical Treatment Preferences    Palliative Care Discussion Held Today:    This NP Benjamin Lawson reviewed medical records, received report from team, assessed the patient and then meet at the patient's bedside along with his daughter and son  to discuss diagnosis, prognosis, GOC, EOL wishes disposition and options.  A detailed discussion was had today regarding advanced directives.  Concepts specific to code status, artifical feeding and hydration, continued IV antibiotics and rehospitalization was had.  The difference between a aggressive medical intervention path  and a palliative comfort care path for this patient at this time was had.  Values and goals of care important to patient and family were attempted to be elicited.  Concept of Hospice and Palliative Care were discussed  Natural trajectory and expectations at EOL were discussed.  Questions and concerns addressed. Family encouraged to call with questions or concerns.  PMT will continue to support holistically.    Contacts/Participants in Discussion: Primary Decision Maker: patient with support of his family   Goals of Care/Code Status/Advance Care Planning:   Code Status:  DNR/DNI  Artificial feeding: no artificial feeding now or in the future  Antibiotics:yes  Diagnostics:yes  Rehospitalization: avoid    Psycho-social/Spiritual:   Support System: family  Desire for further Chaplaincy support:yes  Prognosis: < 6 months  Discharge Planning: Pending       Chief Complaint: shortness of breath and  weakness  History of Present Illness:  81 y/o man with pmh of HTN, CKD and prostate cancer. He was discharged from a rehab facility the afternoon of presentation where he was recovering from a recent hospitalization for GI bleed, during which he was diagnosed with gastric cancer.   His daughter, who is a [employer] scientist, picked him up from his rehab facility and as they were walking from the car to her home, Benjamin Lawson became acutely short of breath. She measured his vital signs and found that he was hypoxemic and tachycardic at which time she called 911.   A CTA done on arrival showed bilateral PEs. Benjamin Lawson has no past history of VTE.   He hs completed radiation therapy for gastric cancer as well as 3 of 5 chemotherapy treatments (taxol and carboplatinin). Per oncology due to a decline in his functional status his oncologist is not planning any further chemotherapy, and is not considered a surgical candidate   Faced with advanced directive decisions and anticipatory care needs.  Primary Diagnoses  Present on Admission:  . PE (pulmonary embolism) . Pressure ulcer . Acute massive pulmonary embolism . Leg swelling . Pulmonary embolus . UTI (lower urinary tract infection) . Escherichia coli urinary tract infection . Hypomagnesemia . Diastolic dysfunction  Palliative Review of Systems:    -denies pain, reports generalized weakness and fatigue   I have reviewed the medical record, interviewed the patient and family, and examined the patient. The following aspects are pertinent.  Past Medical History  Diagnosis Date  . Hypertension   . Hypercholesterolemia   . Chronic kidney disease (CKD), stage II (mild)   . Deafness in left ear   . Peripheral vascular disease   .  Prostate cancer    Social History   Social History  . Marital Status: Widowed    Spouse Name: N/A  . Number of Children: N/A  . Years of Education: N/A   Social History Main Topics  . Smoking status: Former  Smoker -- 0.00 packs/day for 0 years    Types: Cigarettes  . Smokeless tobacco: Never Used     Comment: "stopped smoking in the 1960's  . Alcohol Use: Yes  . Drug Use: No  . Sexual Activity: Not Asked   Other Topics Concern  . None   Social History Narrative   History reviewed. No pertinent family history. Scheduled Meds: . antiseptic oral rinse  7 mL Mouth Rinse BID  . cefTRIAXone (ROCEPHIN)  IV  1 g Intravenous Q24H  . [START ON 05/29/2015] enoxaparin (LOVENOX) injection  110 mg Subcutaneous Q24H  . feeding supplement  1 Container Oral BID BM  . feeding supplement (PRO-STAT SUGAR FREE 64)  30 mL Oral BID BM  . oxybutynin  10 mg Oral Daily  . pantoprazole  40 mg Oral QHS   Continuous Infusions: . sodium chloride 30 mL/hr at 05/27/15 0700  . heparin 800 Units/hr (05/27/15 2212)   PRN Meds:.prochlorperazine Medications Prior to Admission:  Prior to Admission medications   Medication Sig Start Date End Date Taking? Authorizing Provider  Amino Acids-Protein Hydrolys (FEEDING SUPPLEMENT, PRO-STAT SUGAR FREE 64,) LIQD Take 30 mLs by mouth 2 (two) times daily with a meal.   Yes Historical Provider, MD  atorvastatin (LIPITOR) 20 MG tablet Take 20 mg by mouth every evening.    Yes Historical Provider, MD  cholestyramine light (PREVALITE) 4 G packet Take 4 g by mouth daily. Mix in 8 oz liquid   Yes Historical Provider, MD  ENSURE PLUS (ENSURE PLUS) LIQD Take 237 mLs by mouth 2 (two) times daily between meals.   Yes Historical Provider, MD  loperamide (IMODIUM) 2 MG capsule Take by mouth every 6 (six) hours as needed for diarrhea or loose stools.   Yes Historical Provider, MD  megestrol (MEGACE) 400 MG/10ML suspension Take 400 mg by mouth daily.   Yes Historical Provider, MD  oxybutynin (DITROPAN-XL) 10 MG 24 hr tablet Take 10 mg by mouth daily.   Yes Historical Provider, MD  pantoprazole (PROTONIX) 40 MG tablet Take 1 tablet (40 mg total) by mouth 2 (two) times daily before a meal. 04/03/15   Yes Benjamin Dumas, MD  potassium chloride SA (K-DUR,KLOR-CON) 20 MEQ tablet Take 20 mEq by mouth 2 (two) times daily.   Yes Historical Provider, MD  sennosides-docusate sodium (SENOKOT-S) 8.6-50 MG tablet Take 1 tablet by mouth daily as needed for constipation.    Yes Historical Provider, MD   No Known Allergies CBC:    Component Value Date/Time   WBC 4.8 05/28/2015 0756   WBC 2.3* 05/21/2015 1255   HGB 8.7* 05/28/2015 0756   HGB 8.8* 05/21/2015 1255   HCT 26.1* 05/28/2015 0756   HCT 26.5* 05/21/2015 1255   PLT 133* 05/28/2015 0756   PLT 341 05/21/2015 1255   MCV 83.9 05/28/2015 0756   MCV 84.9 05/21/2015 1255   NEUTROABS 2.5 05/23/2015 2106   NEUTROABS 1.6 05/21/2015 1255   LYMPHSABS 0.3* 05/23/2015 2106   LYMPHSABS 0.2* 05/21/2015 1255   MONOABS 0.6 05/23/2015 2106   MONOABS 0.4 05/21/2015 1255   EOSABS 0.0 05/23/2015 2106   EOSABS 0.0 05/21/2015 1255   BASOSABS 0.0 05/23/2015 2106   BASOSABS 0.0 05/21/2015 1255   Comprehensive  Metabolic Panel:    Component Value Date/Time   NA 148* 05/28/2015 0756   NA 143 05/21/2015 1255   K 3.6 05/28/2015 0756   K 4.9 05/21/2015 1255   CL 124* 05/28/2015 0756   CO2 12* 05/28/2015 0756   CO2 23 05/21/2015 1255   BUN 25* 05/28/2015 0756   BUN 19.1 05/21/2015 1255   CREATININE 1.46* 05/28/2015 0756   CREATININE 1.3 05/21/2015 1255   GLUCOSE 82 05/28/2015 0756   GLUCOSE 104 05/21/2015 1255   CALCIUM 7.7* 05/28/2015 0756   CALCIUM 8.8 05/21/2015 1255   AST 23 05/21/2015 1255   AST 30 04/03/2015 0311   ALT 15 05/21/2015 1255   ALT 27 04/03/2015 0311   ALKPHOS 75 05/21/2015 1255   ALKPHOS 41 04/03/2015 0311   BILITOT <0.30 05/21/2015 1255   BILITOT 0.3 04/03/2015 0311   PROT 5.7* 05/21/2015 1255   PROT 4.0* 04/03/2015 0311   ALBUMIN 1.8* 05/27/2015 0230   ALBUMIN 2.5* 05/21/2015 1255    Physical Exam:  Vital Signs: BP 122/68 mmHg  Pulse 95  Temp(Src) 98.3 F (36.8 C) (Oral)  Resp 18  Ht 5\' 11"  (1.803 m)  Wt 75.7 kg  (166 lb 14.2 oz)  BMI 23.29 kg/m2  SpO2 99% SpO2: SpO2: 99 % O2 Device: O2 Device: Not Delivered O2 Flow Rate: O2 Flow Rate (L/min): 2 L/min Intake/output summary:  Intake/Output Summary (Last 24 hours) at 05/28/15 1206 Last data filed at 05/28/15 0959  Gross per 24 hour  Intake 1439.37 ml  Output    700 ml  Net 739.37 ml   LBM: Last BM Date: 05/26/15 Baseline Weight: Weight: 73.1 kg (161 lb 2.5 oz) Most recent weight: Weight: 75.7 kg (166 lb 14.2 oz)  Exam Findings:   General: chronically ill appearing, OOB to chair  HEENT:  Moist buccal membranes, no exudate CVS: RRR Resp: decreased in bases, large amount of clear throat secretions, patient has difficulty clearing Extrem: trace BLE edema Skin: warm and dry Neuro: oriented X3, fully aware of the seriousness of his illness but finds it difficult to talk about, at times handles questions with humor.  Family tell me this is his usual coping mechanism   PPS: 30 % at best                Additional Data Reviewed: Recent Labs     05/27/15  0230  05/28/15  0756  WBC  4.0  4.8  HGB  8.2*  8.7*  PLT  180  133*  NA  145  148*  BUN  27*  25*  CREATININE  1.46*  1.46*     Time In: 1400 Time Out: 1530 Time Total: 90 min  Greater than 50%  of this time was spent counseling and coordinating care related to the above assessment and plan.  Discussed with  Dr  Waldron Labs  Signed by: Benjamin Lessen, NP  Knox Royalty, NP  05/28/2015, 12:06 PM  Please contact Palliative Medicine Team phone at [phone] for questions and concerns.   See AMION for contact information

## 2015-05-28 NOTE — Evaluation (Signed)
Physical Therapy Evaluation Patient Details Name: Benjamin Lawson MRN: [id number] DOB: [birth year] Today's Date: 05/28/2015   History of Present Illness  81 y/o BM admitted with B PE after being d/c from SNF for rehab from GI bleed due to gastric CA.  Pt s/p IVC filter placement (05/26/15)  Clinical Impression  Pt admitted with above diagnosis. Pt currently with functional limitations due to the deficits listed below (see PT Problem List).  Pt will benefit from skilled PT to increase their independence and safety with mobility to allow discharge to the venue listed below.  Pt fatigues quickly and required MOD A with few steps from bed to recliner and RW.  o2 98% HR 118 with transfer.  Recommend SNF at this time.    Follow Up Recommendations SNF    Equipment Recommendations  None recommended by PT    Recommendations for Other Services       Precautions / Restrictions Precautions Precautions: Fall      Mobility  Bed Mobility Overal bed mobility: Needs Assistance Bed Mobility: Rolling;Sidelying to Sit Rolling: Min assist Sidelying to sit: Mod assist       General bed mobility comments: MOD A for getting trunk upright   Transfers Overall transfer level: Needs assistance Equipment used: Rolling walker (2 wheeled) Transfers: Sit to/from [facility] Sit to Stand: Mod assist Stand pivot transfers: Mod assist       General transfer comment: Pt took several small steps from bed to recliner, but wanting to sit too early and poor eccentric control to chair.  Ambulation/Gait             General Gait Details: SPT with a few steps  Stairs            Wheelchair Mobility    Modified Rankin (Stroke Patients Only)       Balance Overall balance assessment: Needs assistance Sitting-balance support: Feet supported;No upper extremity supported Sitting balance-Leahy Scale: Fair     Standing balance support: Bilateral upper extremity  supported Standing balance-Leahy Scale: Poor Standing balance comment: requires RW                             Pertinent Vitals/Pain Pain Assessment: No/denies pain o2 98% on RA, HR 118 after transfer    Home Living Family/patient expects to be discharged to:: Skilled nursing facility Living Arrangements: Alone (daughter next door) Available Help at Discharge: Family;Available 24 hours/day Type of Home: House Home Access: Level entry     Home Layout: One level Home Equipment: Walker - 2 wheels      Prior Function Level of Independence: Needs assistance   Gait / Transfers Assistance Needed: Was walking with a RW hallway distances with rehab           Hand Dominance        Extremity/Trunk Assessment   Upper Extremity Assessment: Defer to OT evaluation           Lower Extremity Assessment: Generalized weakness      Cervical / Trunk Assessment: Normal  Communication   Communication: HOH (hears best out of R ear)  Cognition Arousal/Alertness: Awake/alert Behavior During Therapy: WFL for tasks assessed/performed Overall Cognitive Status: No family/caregiver present to determine baseline cognitive functioning                      General Comments General comments (skin integrity, edema, etc.): Pt with R upper trunk skin tear,  but not torn throught the skin.  Top layer peeling.  Nursing aware.    Exercises        Assessment/Plan    PT Assessment Patient needs continued PT services  PT Diagnosis Generalized weakness;Difficulty walking   PT Problem List Decreased strength;Decreased activity tolerance;Decreased balance;Decreased mobility;Decreased knowledge of use of DME  PT Treatment Interventions DME instruction;Gait training;Functional mobility training;Therapeutic activities;Therapeutic exercise   PT Goals (Current goals can be found in the Care Plan section) Acute Rehab PT Goals Patient Stated Goal: Pt reluctantly agreeable to sit  up in recliner to look out the window. PT Goal Formulation: With patient Time For Goal Achievement: 06/11/15 Potential to Achieve Goals: Good    Frequency Min 3X/week   Barriers to discharge        Co-evaluation               End of Session Equipment Utilized During Treatment: Gait belt Activity Tolerance: Patient limited by fatigue Patient left: in chair;with nursing/sitter in room;with chair alarm set (NT in room with pt getting things set up) Nurse Communication: Mobility status         Time: 7342-8768 PT Time Calculation (min) (ACUTE ONLY): 27 min   Charges:   PT Evaluation $Initial PT Evaluation Tier I: 1 Procedure PT Treatments $Therapeutic Activity: 8-22 mins   PT G Codes:        SMITH,KAREN LUBECK 05/28/2015, 9:42 AM

## 2015-05-28 NOTE — Evaluation (Signed)
Occupational Therapy Evaluation Patient Details Name: Benjamin Lawson MRN: [id number] DOB: 12-25-1932 Today's Date: 05/28/2015    History of Present Illness 81 y/o BM admitted with B PE after being d/c from SNF for rehab from GI bleed due to gastric CA.  Pt s/p IVC filter placement (05/26/15)   Clinical Impression   Plan is for patient to discharge > SNF. No acute OT needs identified, all needs can be met in SNF. Please send text page to OT services if any questions, concerns, or with new orders: [phone] OR call office at [phone]. Thank you for the order.      Follow Up Recommendations  SNF;Supervision/Assistance - 24 hour    Equipment Recommendations  Other (comment) (TBD next venue of care)    Recommendations for Other Services  None at this time   Precautions / Restrictions Precautions Precautions: Fall (high fall risk) Restrictions Weight Bearing Restrictions: No    Mobility Bed Mobility General bed mobility comments: Pt found seated in recliner upon OT entering/exiting room  Transfers Overall transfer level: Needs assistance Equipment used: Rolling walker (2 wheeled) Transfers: Sit to/from Stand Sit to Stand: Mod assist General transfer comment: Mod assist to lift/lower from recliner. Cues for hand placement, technique, sequencing.     Balance Overall balance assessment: Needs assistance Sitting-balance support: No upper extremity supported;Feet supported Sitting balance-Leahy Scale: Fair     Standing balance support: Bilateral upper extremity supported Standing balance-Leahy Scale: Poor    ADL Overall ADL's : Needs assistance/impaired General ADL Comments: Pt overall mod>max assist with ADLs. Pt limited due to decreased activity tolerance/endurance.     Pertinent Vitals/Pain Pain Assessment: No/denies pain   Extremity/Trunk Assessment Upper Extremity Assessment Upper Extremity Assessment: Generalized weakness   Lower Extremity  Assessment Lower Extremity Assessment: Defer to PT evaluation   Cervical / Trunk Assessment Cervical / Trunk Assessment: Normal   Communication Communication Communication: HOH (hears best out of right ear)   Cognition Arousal/Alertness: Awake/alert Behavior During Therapy: WFL for tasks assessed/performed Overall Cognitive Status: History of cognitive impairments - at baseline (daughter reports, "sometimes he just doesn't think right". )              Home Living Family/patient expects to be discharged to:: Skilled nursing facility Living Arrangements: Alone (daughter next door) Available Help at Discharge: Family;Available 24 hours/day Type of Home: House Home Access: Level entry     Home Layout: One level Additional Comments: walk-in shower and standard height toilet seats      Prior Functioning/Environment Level of Independence: Needs assistance  Gait / Transfers Assistance Needed: Was walking with a RW hallway distances with rehab    OT Diagnosis: Generalized weakness   OT Problem List:  n/a, no acute OT needs identified; all needs can be met in SNF   OT Treatment/Interventions:   n/a, no acute OT needs identified; all needs can be met in SNF   OT Goals(Current goals can be found in the care plan section) Acute Rehab OT Goals Patient Stated Goal: none stated, pt kept talking about getting blood drawn OT Goal Formulation:  (defer > SNF)  OT Frequency:   n/a, no acute OT needs identified; all needs can be met in SNF   Barriers to D/C:  None known at this time    End of Session Equipment Utilized During Treatment: Gait belt;Rolling walker Nurse Communication: Mobility status (RN in room during some of OT session)  Activity Tolerance: Patient tolerated treatment well Patient left: in chair;with  call bell/phone within reach;with chair alarm set   Time: [phone] OT Time Calculation (min): 15 min Charges:  OT General Charges $OT Visit: 1 Procedure OT  Evaluation $Initial OT Evaluation Tier I: 1 Procedure  CLAY,PATRICIA , MS, OTR/L, CLT Pager: [phone]  05/28/2015, 3:23 PM

## 2015-05-28 NOTE — Progress Notes (Signed)
Nutrition Follow-up  DOCUMENTATION CODES:   Not applicable  INTERVENTION:   -Continue Boost Breeze po TID, each supplement provides 250 kcal and 9 grams of protein  -Continue Prostat, 60 ml daily, each 30 mls provides 100 kcal and 15 grams of protein  NUTRITION DIAGNOSIS:   Increased nutrient needs related to wound healing, cancer and cancer related treatments as evidenced by estimated needs.  Ongoing  GOAL:   Patient will meet greater than or equal to 90% of their needs  Progressing  MONITOR:   PO intake, Supplement acceptance, Diet advancement, Skin, Labs, I & O's  REASON FOR ASSESSMENT:   Malnutrition Screening Tool    ASSESSMENT:   [name] is a lovely 81 y/o man with pmh of HTN, CKD and prostate cancer. He was discharged from a rehab facility the afternoon of presentation where he was recovering from a recent hospitalization for GI bleed, during which he was diagnosed with gastric cancer. His daughter, who is a Marine scientist, picked him up from his rehab facility and as they were walking from the car to her home, Benjamin Lawson became acutely short of breath. She measured his vital signs and found that he was hypoxemic and tachycardic at which time she called 911. A CTA done on arrival showed bilateral PEs. Benjamin Lawson has no past history of VTE. He hs completed radiation therapy for gastric cancer as well as 3 of 5 chemotherapy treatments (taxol and carboplatinin). Due to a decline in his functional status his oncologist is not planning any further chemotherapy. Of note, on presentation last month he was found to have frank melena (this is what prompted the EGD that revealed his gastric cancer).   Pt s/p IVC filter placement on 05/26/15.  Pt sitting in recliner at time of visit, just finishing phone conversation upon RD arrival.   Pt reports good appetite, revealing that he ate all of his breakfast this morning. Noted Boost Breeze supplement at bedside; pt has consumed  approximately 25% of supplement. When questioned about supplement completion, he reports "I take whatever they give me". Offered to change supplement regimen now that diet is advanced, however, pt politely declined, stating he was okay with the supplements he is currently receiving. Discussed importance of good meal and supplement intake to promote healing.   Pt denies any further nutritional needs at this time, but expressed appreciation for visit.   RNCM and CSW following. Discharge disposition is SNF vs hospice vs home health. Palliative care consult pending.   Labs reviewed. Na: 148.   Diet Order:  Diet regular Room service appropriate?: Yes; Fluid consistency:: Thin  Skin:  Wound (see comment) (stage I sacrum)  Last BM:  05/27/15  Height:   Ht Readings from Last 1 Encounters:  05/27/15 5\' 11"  (1.803 m)    Weight:   Wt Readings from Last 1 Encounters:  05/28/15 166 lb 14.2 oz (75.7 kg)    Ideal Body Weight:  78.2 kg  BMI:  Body mass index is 23.29 kg/(m^2).  Estimated Nutritional Needs:   Kcal:  1700-1900 (23-26 kcal/kg)  Protein:  88-102 g (1.2-1.4 g/kg bw)  Fluid:  1.7-1.9 liters fluid  EDUCATION NEEDS:   No education needs identified at this time  Jenifer A. Jimmye Norman, RD, LDN, CDE Pager: [phone] After hours Pager: [phone]

## 2015-05-28 NOTE — Progress Notes (Signed)
Dr. Waldron Labs made aware of patient's HR sustaining in the 115s. BP 118/78. Patient denies associated symptoms. No new orders at this time.

## 2015-05-28 NOTE — Clinical Social Work Placement (Signed)
   CLINICAL SOCIAL WORK PLACEMENT  NOTE  Date:  05/28/2015  Patient Details  Name: Benjamin Lawson MRN: [id number] Date of Birth: [birth year]  Clinical Social Work is seeking post-discharge placement for this patient at the [place] level of care (*CSW will initial, date and re-position this form in  chart as items are completed):  Yes   Patient/family provided with [place] Work Department's list of facilities offering this level of care within the geographic area requested by the patient (or if unable, by the patient's family).  Yes   Patient/family informed of their freedom to choose among providers that offer the needed level of care, that participate in Medicare, Medicaid or managed care program needed by the patient, have an available bed and are willing to accept the patient.  Yes   Patient/family informed of 's ownership interest in [facility] and [facility], as well as of the fact that they are under no obligation to receive care at these facilities.  PASRR submitted to EDS on 05/28/15     PASRR number received on       Existing PASRR number confirmed on 05/28/15     FL2 transmitted to all facilities in geographic area requested by pt/family on       FL2 transmitted to all facilities within larger geographic area on       Patient informed that his/her managed care company has contracts with or will negotiate with certain facilities, including the following:            Patient/family informed of bed offers received.  Patient chooses bed at       Physician recommends and patient chooses bed at      Patient to be transferred to   on  .  Patient to be transferred to facility by       Patient family notified on   of transfer.  Name of family member notified:        PHYSICIAN   Please sign FL2 and DNR    Additional Comment:    _______________________________________________ Ross Ludwig,  LCSWA 05/28/2015, 7:19 PM

## 2015-05-28 NOTE — Care Management Note (Addendum)
Case Management Note  Patient Details  Name: Benjamin Lawson MRN: [id number] Date of Birth: 1932-12-21  Subjective/Objective:                  Date: 05-28-15 Wednesday Spoke with patient at the bedside. Introduced self as Tourist information centre manager and explained role in discharge planning and how to be reached. Verified patient lives alone. Verified patient anticipates to go SNF at time of discharge. Patient has DME walker.Expressed potential need for no other DME. Patient denied  needing help with their medication. Patient drives to MD appointments.  Patient currently receives [facility] pt ot rn  through [place].    Plan: PT and MD recommending SNF at discharge. Reviewed this with patient and patient is agreeable to SNF. Patient aware that SW will see him today or tomorrow to discuss options.    Carles Collet RN BSN CM [phone]   Action/Plan:  Anticipate DC to SNF. Eric SW updated after progression and again at 2:00 of plan for discharge to SNF possibly tomorrow.   Expected Discharge Date:                  Expected Discharge Plan:  Skilled Nursing Facility  In-House Referral:  Clinical Social Work  Discharge planning Services  CM Consult  Post Acute Care Choice:    Choice offered to:     DME Arranged:    DME Agency:     HH Arranged:    [place] Agency:     Status of Service:  In process, will continue to follow  Medicare Important Message Given:  Yes-second notification given Date Medicare IM Given:    Medicare IM give by:    Date Additional Medicare IM Given:    Additional Medicare Important Message give by:     If discussed at [place] of Stay Meetings, dates discussed:    Additional Comments:  Carles Collet, RN 05/28/2015, 3:02 PM

## 2015-05-29 DIAGNOSIS — R531 Weakness: Secondary | ICD-10-CM | POA: Insufficient documentation

## 2015-05-29 DIAGNOSIS — Z7189 Other specified counseling: Secondary | ICD-10-CM

## 2015-05-29 DIAGNOSIS — Z515 Encounter for palliative care: Secondary | ICD-10-CM

## 2015-05-29 LAB — CBC
HEMATOCRIT: 28.8 % — AB (ref 39.0–52.0)
Hemoglobin: 9.3 g/dL — ABNORMAL LOW (ref 13.0–17.0)
MCH: 27.4 pg (ref 26.0–34.0)
MCHC: 32.3 g/dL (ref 30.0–36.0)
MCV: 85 fL (ref 78.0–100.0)
Platelets: 160 10*3/uL (ref 150–400)
RBC: 3.39 MIL/uL — ABNORMAL LOW (ref 4.22–5.81)
RDW: 19.3 % — AB (ref 11.5–15.5)
WBC: 5.9 10*3/uL (ref 4.0–10.5)

## 2015-05-29 LAB — PATHOLOGIST SMEAR REVIEW

## 2015-05-29 MED ORDER — HEPARIN SOD (PORK) LOCK FLUSH 100 UNIT/ML IV SOLN
500.0000 [IU] | INTRAVENOUS | Status: AC | PRN
  Administered 2015-05-29: 500 [IU]

## 2015-05-29 MED ORDER — ENOXAPARIN SODIUM 100 MG/ML ~~LOC~~ SOLN: 110.0000 mg | SUBCUTANEOUS | Status: AC

## 2015-05-29 MED ORDER — CEPHALEXIN 500 MG PO CAPS: 500.0000 mg | ORAL_CAPSULE | Freq: Two times a day (BID) | ORAL | Status: AC

## 2015-05-29 MED ORDER — CEPHALEXIN 500 MG PO CAPS
500.0000 mg | ORAL_CAPSULE | Freq: Two times a day (BID) | ORAL | Status: DC
  Administered 2015-05-29: 500 mg via ORAL
  Filled 2015-05-29: qty 1

## 2015-05-29 NOTE — Progress Notes (Signed)
Benjamin Lawson to be D/C'd Home per MD order.  Discussed with the patient and all questions fully answered.  VSS.  Patient discharged via [facility] to [facility]. Darlene, family updated.   L'ESPERANCE, RACHEL C 05/29/2015 6:26 PM

## 2015-05-29 NOTE — Discharge Instructions (Signed)
Follow with Primary MD Donnajean Lopes, MD in 7 days   Get CBC, CMP, 2 view Chest X ray checked  by Primary MD next visit.    Activity: As tolerated with Full fall precautions use walker/cane & assistance as needed   Disposition SNF   Diet: Heart Healthy  , with feeding assistance and aspiration precautions.  For Heart failure patients - Check your Weight same time everyday, if you gain over 2 pounds, or you develop in leg swelling, experience more shortness of breath or chest pain, call your Primary MD immediately. Follow Cardiac Low Salt Diet and 1.5 lit/day fluid restriction.   On your next visit with your primary care physician please Get Medicines reviewed and adjusted.   Please request your Prim.MD to go over all Hospital Tests and Procedure/Radiological results at the follow up, please get all Hospital records sent to your Prim MD by signing hospital release before you go home.   If you experience worsening of your admission symptoms, develop shortness of breath, life threatening emergency, suicidal or homicidal thoughts you must seek medical attention immediately by calling 911 or calling your MD immediately  if symptoms less severe.  You Must read complete instructions/literature along with all the possible adverse reactions/side effects for all the Medicines you take and that have been prescribed to you. Take any new Medicines after you have completely understood and accpet all the possible adverse reactions/side effects.   Do not drive, operating heavy machinery, perform activities at heights, swimming or participation in water activities or provide baby sitting services if your were admitted for syncope or siezures until you have seen by Primary MD or a Neurologist and advised to do so again.  Do not drive when taking Pain medications.    Do not take more than prescribed Pain, Sleep and Anxiety Medications  Special Instructions: If you have smoked or chewed Tobacco  in  the last 2 yrs please stop smoking, stop any regular Alcohol  and or any Recreational drug use.  Wear Seat belts while driving.   Please note  You were cared for by a hospitalist during your hospital stay. If you have any questions about your discharge medications or the care you received while you were in the hospital after you are discharged, you can call the unit and asked to speak with the hospitalist on call if the hospitalist that took care of you is not available. Once you are discharged, your primary care physician will handle any further medical issues. Please note that NO REFILLS for any discharge medications will be authorized once you are discharged, as it is imperative that you return to your primary care physician (or establish a relationship with a primary care physician if you do not have one) for your aftercare needs so that they can reassess your need for medications and monitor your lab values.

## 2015-05-29 NOTE — Progress Notes (Signed)
Report given to Marzetta Board ,Nurse at [facility]

## 2015-05-29 NOTE — Progress Notes (Signed)
IP PROGRESS NOTE  Subjective:   [name]   He has no specific complaint today.  Objective: Vital signs in last 24 hours: Blood pressure 113/74, pulse 108, temperature 98 F (36.7 C), temperature source Oral, resp. rate 20, height 5\' 11"  (1.803 m), weight 172 lb 6.4 oz (78.2 kg), SpO2 99 %.  Intake/Output from previous day: 09/28 0701 - 09/29 0700 In: 1097.2 [P.O.:620; I.V.:477.2] Out: -   Physical Exam:  Lungs: Clear anteriorly Cardiac: Regular rate and rhythm Abdomen: Soft and nontender Extremities: No leg edema Neurologic: Alert, oriented to place.   Portacath/PICC-without erythema  Lab Results:  Recent Labs  05/28/15 0756 05/29/15 0439  WBC 4.8 5.9  HGB 8.7* 9.3*  HCT 26.1* 28.8*  PLT 133* 160    BMET  Recent Labs  05/27/15 0230 05/28/15 0756  NA 145 148*  K 2.3* 3.6  CL 119* 124*  CO2 15* 12*  GLUCOSE 100* 82  BUN 27* 25*  CREATININE 1.46* 1.46*  CALCIUM 7.2* 7.7*   Heparin level 0.43   Medications: I have reviewed the patient's current medications.  Assessment/Plan: 1. Gastric cancer; EGD on 03/28/2015 which showed a near circumferential mass in the cardia. Biopsy showed adenocarcinoma. Staging CT scans showed diffuse wall thickening of the stomach; small nodular areas of soft tissue seen adjacent to the stomach; small hypoattenuating liver lesions.   Initiation of radiation 04/09/2015.   Initiation of weekly Taxol/carboplatin 04/17/2015.   Week 2 Taxol/carboplatin 04/24/2015.   Week 3 Taxol/carboplatin held due to neutropenia.   Week 4 Taxol/carboplatin 05/08/2015.   Week 5 held on 05/15/2015 due to neutropenia.   Radiation completed 05/20/2015. 2. History of GI bleeding secondary to #1 3. Anemia secondary to GI bleeding and chemotherapy/radiation-stable 4. Renal insufficiency 5. Left lower extremity DVT and bilateral pulmonary embolism 05/24/2015-maintained on a heparin drip, IVC filter placed 05/26/2015  Benjamin Lawson  appears stable. He has been maintained on IV heparin since hospital admission. No gross bleeding and the hemoglobin is stable. He remains at increased risk for bleeding with the gastric tumor. I agree with switching to Lovenox anticoagulation. We will consider tapering to a prophylactic dose in a proximally 2 weeks. His performance status remains poor. I discussed the situation with his daughter. She agrees he will need skilled nursing facility placement at discharge. She understands the risk of bleeding while on Lovenox. Benjamin Lawson will return for an office visit at the [facility] 06/03/2015.   LOS: 5 days   Benjamin Lawson  05/29/2015, 7:55 AM

## 2015-05-29 NOTE — Discharge Summary (Signed)
Benjamin Lawson, is a 81 y.o. male  DOB 1933/07/19  MRN [id number].  Admission date:  05/23/2015  Admitting Physician  Raylene Miyamoto, MD  Discharge Date:  05/29/2015   Primary MD  Donnajean Lopes, MD  Recommendations for primary care physician for things to follow:  - Check basic labs including CBC, BMP ensure stable hemoglobin count as patient is on full dose anticoagulation. - Monitor for signs of GI bleed as patient is on full dose anticoagulation.   Admission Diagnosis  UTI (lower urinary tract infection) [N39.0] Elevated troponin [R79.89] Acute massive pulmonary embolism [I26.99]   Discharge Diagnosis  UTI (lower urinary tract infection) [N39.0] Elevated troponin [R79.89] Acute massive pulmonary embolism [I26.99]    Active Problems:   PE (pulmonary embolism)   Pressure ulcer   Acute massive pulmonary embolism   Leg swelling   Pulmonary embolus   UTI (lower urinary tract infection)   Escherichia coli urinary tract infection   Hypomagnesemia   Diastolic dysfunction      Past Medical History  Diagnosis Date  . Hypertension   . Hypercholesterolemia   . Chronic kidney disease (CKD), stage II (mild)   . Deafness in left ear   . Peripheral vascular disease   . Prostate cancer     Past Surgical History  Procedure Laterality Date  . Multiple tooth extractions  01/2015    "all my lower teeth"  . Transurethral resection of prostate  1990's  . Esophagogastroduodenoscopy N/A 03/28/2015    Procedure: ESOPHAGOGASTRODUODENOSCOPY (EGD);  Surgeon: Richmond Campbell, MD;  Location: [facility] ENDOSCOPY;  Service: Endoscopy;  Laterality: N/A;       History of present illness and  Hospital Course:     Kindly see H&P for history of present illness and admission details, please review complete Labs, Consult reports and Test reports for all details in brief  HPI  from the history and physical done  on the day of admission  81 y/o man with PMHx HTN, CKD, Prostate Cancer, gastric cancer S/P XRT + 3 of 5 chemotherapy treatments (taxol and carboplatinin).  Discharged from a rehab facility the afternoon of presentation where he was recovering from a recent hospitalization for GI bleed, during which he was diagnosed with gastric cancer. His daughter, who is a Marine scientist, picked him up from his rehab facility and as they were walking from the car to her home, Benjamin Lawson became acutely short of breath. She measured his vital signs and found that he was hypoxemic and tachycardic at which time she called 911. A CTA done on arrival showed bilateral PEs. Benjamin Lawson has no past history of VTE. Due to a decline in his functional status his oncologist is not planning any further chemotherapy. Of note, on presentation last month he was found to have frank melena (this is what prompted the EGD that revealed his gastric cancer).   Hospital Course   Submassive Bilateral PE/DVT - initially on heparin gtt - no evidence on signs of recurrent GI bleed, he was transitioned to  full dose Lovenox - not a candidate for lytic therapy due to recent GI bleed, PE most likely secondary to patient's ongoing gastric cancer, will be seen by hematology/oncology as an outpatient regarding further decision off anticoagulation, and if Lovenox dose should be lowered given his risk for GI bleed. - IVC filter placed 9/26  Tachycardic -Resolved  Pulmonary hypertension -no specific treatment indicated beyond treatment of PE at present time.  Diastolic dysfunction - well compensated at present  Hypokalemia -Repleted  Hypomagnesemia -Repleted  CKD -Slightly elevated but stable   Gastric cancer/GI bleed - Per Dr Ladell Pier (oncology) completed a course of chemotherapy/radiation -Patient not candidate for surgical intervention -Monitor closely for melena  E coli UTI Urine cx >100k colonies - amp resistant -  treated with Rocephin 4 days, to finish another 6 days on oral Keflex.  Palliative care  -Patient was seen by palliative care during hospital stay, CODE STATUS was changed to DO NOT RESUSCITATE     Discharge Condition:  stable  Follow UP  Follow-up Information    Follow up with Betsy Coder, MD.   Specialty:  Oncology   Why:  on 06/03/2015   Contact information:   [place] [address] [phone]       Follow up with Donnajean Lopes, MD.   Specialty:  Internal Medicine   Why:  After discharge from SNF   Contact information:   [place] [address] [phone]         Discharge Instructions  and  Discharge Medications     Discharge Instructions    Discharge instructions    Complete by:  As directed   Follow with Primary MD Donnajean Lopes, MD in 7 days   Get CBC, CMP, 2 view Chest X ray checked  by Primary MD next visit.    Activity: As tolerated with Full fall precautions use walker/cane & assistance as needed   Disposition SNF   Diet: Heart Healthy  , with feeding assistance and aspiration precautions.  For Heart failure patients - Check your Weight same time everyday, if you gain over 2 pounds, or you develop in leg swelling, experience more shortness of breath or chest pain, call your Primary MD immediately. Follow Cardiac Low Salt Diet and 1.5 lit/day fluid restriction.   On your next visit with your primary care physician please Get Medicines reviewed and adjusted.   Please request your Prim.MD to go over all Hospital Tests and Procedure/Radiological results at the follow up, please get all Hospital records sent to your Prim MD by signing hospital release before you go home.   If you experience worsening of your admission symptoms, develop shortness of breath, life threatening emergency, suicidal or homicidal thoughts you must seek medical attention immediately by calling 911 or calling your MD  immediately  if symptoms less severe.  You Must read complete instructions/literature along with all the possible adverse reactions/side effects for all the Medicines you take and that have been prescribed to you. Take any new Medicines after you have completely understood and accpet all the possible adverse reactions/side effects.   Do not drive, operating heavy machinery, perform activities at heights, swimming or participation in water activities or provide baby sitting services if your were admitted for syncope or siezures until you have seen by Primary MD or a Neurologist and advised to do so again.  Do not drive when taking Pain medications.    Do not take more than prescribed Pain,  Sleep and Anxiety Medications  Special Instructions: If you have smoked or chewed Tobacco  in the last 2 yrs please stop smoking, stop any regular Alcohol  and or any Recreational drug use.  Wear Seat belts while driving.   Please note  You were cared for by a hospitalist during your hospital stay. If you have any questions about your discharge medications or the care you received while you were in the hospital after you are discharged, you can call the unit and asked to speak with the hospitalist on call if the hospitalist that took care of you is not available. Once you are discharged, your primary care physician will handle any further medical issues. Please note that NO REFILLS for any discharge medications will be authorized once you are discharged, as it is imperative that you return to your primary care physician (or establish a relationship with a primary care physician if you do not have one) for your aftercare needs so that they can reassess your need for medications and monitor your lab values.     Increase activity slowly    Complete by:  As directed             Medication List    STOP taking these medications        megestrol 400 MG/10ML suspension  Commonly known as:  MEGACE      TAKE  these medications        atorvastatin 20 MG tablet  Commonly known as:  LIPITOR  Take 20 mg by mouth every evening.     cephALEXin 500 MG capsule  Commonly known as:  KEFLEX  Take 1 capsule (500 mg total) by mouth every 12 (twelve) hours. Take for 6 days then stop.     cholestyramine light 4 G packet  Commonly known as:  PREVALITE  Take 4 g by mouth daily. Mix in 8 oz liquid     enoxaparin 100 MG/ML injection  Commonly known as:  LOVENOX  Inject 1.1 mLs (110 mg total) into the skin daily.     ENSURE PLUS Liqd  Take 237 mLs by mouth 2 (two) times daily between meals.     feeding supplement (PRO-STAT SUGAR FREE 64) Liqd  Take 30 mLs by mouth 2 (two) times daily with a meal.     loperamide 2 MG capsule  Commonly known as:  IMODIUM  Take by mouth every 6 (six) hours as needed for diarrhea or loose stools.     oxybutynin 10 MG 24 hr tablet  Commonly known as:  DITROPAN-XL  Take 10 mg by mouth daily.     pantoprazole 40 MG tablet  Commonly known as:  PROTONIX  Take 1 tablet (40 mg total) by mouth 2 (two) times daily before a meal.     potassium chloride SA 20 MEQ tablet  Commonly known as:  K-DUR,KLOR-CON  Take 20 mEq by mouth 2 (two) times daily.     sennosides-docusate sodium 8.6-50 MG tablet  Commonly known as:  SENOKOT-S  Take 1 tablet by mouth daily as needed for constipation.          Diet and Activity recommendation: See Discharge Instructions above   Consults obtained -  Hematology /oncology   Major procedures and Radiology Reports - PLEASE review detailed and final reports for all details, in brief -    9/24 echocardiogram;- Left ventricle: mild LVH. -LVEF= 65%-70%.  -(grade 1 diastolic dysfunction). - Pulmonary arteries: PA peak pressure: 53 mm Hg (S).  9/26 IVC filter placement via right IJ vein  Dg Chest 2 View  05/23/2015   CLINICAL DATA:  Worsening shortness of breath and generalize weakness for several days. History of gastric cancer post  chemotherapy.  EXAM: CHEST  2 VIEW  COMPARISON:  05/08/2015  FINDINGS: Normal heart size and pulmonary vascularity. Chronic elevation of the left hemidiaphragm with prominent gas-filled dilated colon. Gaseous distention of the colon is similar to prior study and suggests colonic ileus. Atelectasis in the left lung base. Right lung appears clear and expanded. Power port type central venous catheter with tip over the low SVC region. Calcified and tortuous aorta. Degenerative changes in the spine and shoulders.  IMPRESSION: Chronic elevation of the left hemidiaphragm with atelectasis in the left lung base. No evidence of active pulmonary disease. Again demonstrated is prominent gaseous distention of the colon suggesting colonic ileus.   Electronically Signed   By: Lucienne Capers M.D.   On: 05/23/2015 22:13   Dg Chest 2 View  05/08/2015   CLINICAL DATA:  Shortness of breath. Cough and chest congestion. Gastric cancer.  EXAM: CHEST  2 VIEW  COMPARISON:  Chest x-rays dated 04/02/2015 and 03/27/2015  FINDINGS: Heart size and pulmonary vascularity are normal. Power port in good position. Chronic elevation the left hemidiaphragm. Gaseous distention of the colon. No osseous abnormality.  IMPRESSION: No acute disease in the chest. Chronic elevation of the left hemidiaphragm with gaseous distention of the colon, slightly more prominent than on prior exams.   Electronically Signed   By: Lorriane Shire M.D.   On: 05/08/2015 14:34   Ct Angio Chest Pe W/cm &/or Wo Cm  05/23/2015   CLINICAL DATA:  Extreme shortness of breath in a patient who is had recent pulmonary embolus. Hypoxia, tachycardia, shortness of breath, history of gastric cancer and prostate cancer.  EXAM: CT ANGIOGRAPHY CHEST WITH CONTRAST  TECHNIQUE: Multidetector CT imaging of the chest was performed using the standard protocol during bolus administration of intravenous contrast. Multiplanar CT image reconstructions and MIPs were obtained to evaluate the  vascular anatomy.  CONTRAST:  57mL OMNIPAQUE IOHEXOL 350 MG/ML SOLN  COMPARISON:  None.  FINDINGS: Technically adequate study with good opacification of the central and segmental pulmonary arteries. Multiple filling defects are demonstrated in the distal right and left main pulmonary arteries with extension into bilateral upper and lower lobe segmental branches. RV to LV ratio is 1.4 suggesting possible right heart strain.  Normal heart size. Normal caliber thoracic aorta. No evidence of aortic dissection. Calcification in the aorta and coronary arteries. Great vessel origins are patent. Esophagus is decompressed. Prominent lymph nodes in the right paratracheal and pretracheal region without pathologic enlargement.  Elevation of the left hemidiaphragm. Small left pleural effusion with atelectasis or consolidation in the left lung base. Emphysematous changes in the lungs. Scarring in the lung apices. No pneumothorax.  Included portions of the upper lungs demonstrate gastric wall thickening in a decompressed stomach. This may correlate with patient's known history of gastric carcinoma. Spleen is atrophic. Colon is distended. Degenerative changes in the spine. No destructive bone lesions.  Review of the MIP images confirms the above findings.  IMPRESSION: Examination is positive for multiple bilateral pulmonary emboli. Increased RV to LV ratio suggest possibility of right heart strain. Elevation of left hemidiaphragm with small left pleural effusion and atelectasis or consolidation in the left lung base. Gaseous distention of the colon. Gastric wall thickening may correspond to history of gastric carcinoma.  These results were called by  telephone at the time of interpretation on 05/23/2015 at 11:50 pm to Dr. Antony Blackbird , who verbally acknowledged these results.   Electronically Signed   By: Lucienne Capers M.D.   On: 05/23/2015 23:53   Ir Ivc Filter Plmt / S&i /img Guid/mod Sed  05/26/2015   CLINICAL DATA:   81 year old male with a history of pulmonary embolism has developed bleeding on anti coagulation. Filter is indicated.  EXAM: 1. ULTRASOUND GUIDANCE FOR VASCULAR ACCESS OF THE RIGHT JUGULAR VEIN. 2. IVC VENOGRAM. 3. PERCUTANEOUS IVC FILTER PLACEMENT.  ANESTHESIA/SEDATION: 1.0 mg IV Versed; 50 mcg IV Fentanyl.  Total Moderate Sedation Time  FifteenMinutes.  CONTRAST:  15 cc  FLUOROSCOPY TIME:  2 minutes 30 seconds  PROCEDURE: The procedure, risks, benefits, and alternatives were explained to the patient. Questions regarding the procedure were encouraged and answered. The patient understands and consents to the procedure.  The right neck was prepped with Betadine in a sterile fashion, and a sterile drape was applied covering the operative field. A sterile gown and sterile gloves were used for the procedure. Local anesthesia was provided with 1% Lidocaine.  Under direct ultrasound guidance, a 21 gauge needle was advanced into the right internal jugular vein with ultrasound image documentation performed.  The micro wire would not pass into the superior vena cava adjacent to an indwelling right-sided port catheter. Once the micro sheath was passed into the internal jugular vein, the wire and inner dilator were removed. A Glidewire was passed through the 3 [place] sheath, and was navigated adjacent to the port catheter through the right heart into the inferior vena cava. The 3 French catheter was removed and a 6 French sheath was placed over the Glidewire. An angled catheter was then passed into the IVC over the Glidewire and the Glidewire was exchanged for a Bentson wire.  Catheter and sheath were removed, and the [facility] deployment sheath was placed into the IVC.  Cavagram was performed. A Denali IVC filter was then advanced in the sheath. This was then fully deployed in the infrarenal IVC. Final filter position was confirmed with a fluoroscopic spot image. Contrast injection was also performed through the sheath under  fluoroscopy to confirm patency of the IVC at the level of the filter. After the procedure the sheath was removed and hemostasis obtained with manual compression.  Patient tolerated the procedure well and remained hemodynamically stable throughout.  No complications were encountered and no significant blood loss was encountered.  COMPLICATIONS: None.  FINDINGS: IVC venography demonstrates a normal caliber IVC with no evidence of thrombus. Renal veins are identified bilaterally. The IVC filter was successfully positioned below the level of the renal veins and is appropriately oriented. This IVC filter has both permanent and retrievable indications.  Occlusion of the right brachiocephalic vein adjacent to the port catheter.  IMPRESSION: Status post placement of IVC filter.  This IVC filter is potentially retrievable. The patient can be assessed for filter retrieval by Interventional Radiology in approximately 8-12 weeks. Further recommendations regarding filter retrieval, continued surveillance or declaration of device permanence, will be made at that time.  Signed,  Dulcy Fanny. Earleen Newport, DO  [facility]  [facility]   Electronically Signed   By: Corrie Mckusick D.O.   On: 05/26/2015 17:35    Micro Results     Recent Results (from the past 240 hour(s))  TECHNOLOGIST REVIEW     Status: None   Collection Time: 05/21/15 12:55 PM  Result Value Ref Range  Status   Technologist Review   Final    Mk Poiks with Burrs, [place], Fragments, and Helmets  Urine culture     Status: None   Collection Time: 05/23/15  9:31 PM  Result Value Ref Range Status   Specimen Description URINE, CATHETERIZED  Final   Special Requests NONE  Final   Culture >=100,000 COLONIES/mL ESCHERICHIA COLI  Final   Report Status 05/26/2015 FINAL  Final   Organism ID, Bacteria ESCHERICHIA COLI  Final      Susceptibility   Escherichia coli - MIC*    AMPICILLIN >=32 RESISTANT Resistant      CEFAZOLIN <=4 SENSITIVE Sensitive     CEFTRIAXONE <=1 SENSITIVE Sensitive     CIPROFLOXACIN <=0.25 SENSITIVE Sensitive     GENTAMICIN <=1 SENSITIVE Sensitive     IMIPENEM <=0.25 SENSITIVE Sensitive     NITROFURANTOIN <=16 SENSITIVE Sensitive     TRIMETH/SULFA >=320 RESISTANT Resistant     AMPICILLIN/SULBACTAM >=32 RESISTANT Resistant     PIP/TAZO <=4 SENSITIVE Sensitive     * >=100,000 COLONIES/mL ESCHERICHIA COLI  MRSA PCR Screening     Status: None   Collection Time: 05/24/15  3:01 AM  Result Value Ref Range Status   MRSA by PCR NEGATIVE NEGATIVE Final    Comment:        The GeneXpert MRSA Assay (FDA approved for NASAL specimens only), is one component of a comprehensive MRSA colonization surveillance program. It is not intended to diagnose MRSA infection nor to guide or monitor treatment for MRSA infections.   C difficile quick scan w PCR reflex     Status: None   Collection Time: 05/27/15 12:22 AM  Result Value Ref Range Status   C Diff antigen NEGATIVE NEGATIVE Final   C Diff toxin NEGATIVE NEGATIVE Final   C Diff interpretation Negative for toxigenic C. difficile  Final       Today   Subjective:   Benjamin Lawson today has no headache,no chest abdominal pain,no new weakness tingling or numbness, feels much better wants to go home today.   Objective:   Blood pressure 113/74, pulse 108, temperature 98 F (36.7 C), temperature source Oral, resp. rate 20, height 5\' 11"  (1.803 m), weight 78.2 kg (172 lb 6.4 oz), SpO2 99 %.   Intake/Output Summary (Last 24 hours) at 05/29/15 1039 Last data filed at 05/29/15 0953  Gross per 24 hour  Intake 1052.3 ml  Output      0 ml  Net 1052.3 ml    Exam Awake Alert,  [place].AT,PERRAL Supple Neck,No JVD,  Symmetrical Chest wall movement, Good air movement bilaterally,  RRR,No Gallops,Rubs , No Parasternal Heave +ve B.Sounds, Abd Soft, Non tender, No organomegaly appriciated, No rebound -guarding or rigidity. No Cyanosis,  Clubbing ,+1 edema, No new Rash or bruise  Data Review   CBC w Diff: Lab Results  Component Value Date   WBC 5.9 05/29/2015   WBC 2.3* 05/21/2015   HGB 9.3* 05/29/2015   HGB 8.8* 05/21/2015   HCT 28.8* 05/29/2015   HCT 26.5* 05/21/2015   PLT 160 05/29/2015   PLT 341 05/21/2015   LYMPHOPCT 10 05/23/2015   LYMPHOPCT 10.5* 05/21/2015   MONOPCT 18 05/23/2015   MONOPCT 18.4* 05/21/2015   EOSPCT 0 05/23/2015   EOSPCT 0.0 05/21/2015   BASOPCT 1 05/23/2015   BASOPCT 0.4 05/21/2015    CMP: Lab Results  Component Value Date   NA 148* 05/28/2015   NA 143 05/21/2015   K 3.6 05/28/2015  K 4.9 05/21/2015   CL 124* 05/28/2015   CO2 12* 05/28/2015   CO2 23 05/21/2015   BUN 25* 05/28/2015   BUN 19.1 05/21/2015   CREATININE 1.46* 05/28/2015   CREATININE 1.3 05/21/2015   PROT 5.7* 05/21/2015   PROT 4.0* 04/03/2015   ALBUMIN 1.8* 05/27/2015   ALBUMIN 2.5* 05/21/2015   BILITOT <0.30 05/21/2015   BILITOT 0.3 04/03/2015   ALKPHOS 75 05/21/2015   ALKPHOS 41 04/03/2015   AST 23 05/21/2015   AST 30 04/03/2015   ALT 15 05/21/2015   ALT 27 04/03/2015  .   Total Time in preparing paper work, data evaluation and todays exam - 35 minutes  ELGERGAWY, DAWOOD M.D on 05/29/2015 at 10:39 AM  [facility]   Office  [phone]

## 2015-05-29 NOTE — Care Management Note (Signed)
Case Management Note  Patient Details  Name: Benjamin Lawson MRN: [id number] Date of Birth: [birth year]  Subjective/Objective:                  Date: 05-28-15 Wednesday Spoke with patient at the bedside. Introduced self as Tourist information centre manager and explained role in discharge planning and how to be reached. Verified patient lives alone. Verified patient anticipates to go SNF at time of discharge. Patient has DME walker.Expressed potential need for no other DME. Patient denied needing help with their medication. Patient drives to MD appointments.  Patient currently receives [facility] pt ot rn through [place].    Plan: PT and MD recommending SNF at discharge. Reviewed this with patient and patient is agreeable to SNF. Patient aware that SW will see him today or tomorrow to discuss options.   Carles Collet RN BSN CM [phone]    Action/Plan:  05-29-15 Thursday Patient to discharge to SNF today, facilitated through SW.   Expected Discharge Date:                  Expected Discharge Plan:  [place]  In-House Referral:  Clinical Social Work  Discharge planning Services  CM Consult  Post Acute Care Choice:    Choice offered to:     DME Arranged:    DME Agency:     HH Arranged:    [place] Agency:     Status of Service:  Completed, signed off  Medicare Important Message Given:  Yes-second notification given Date Medicare IM Given:    Medicare IM give by:    Date Additional Medicare IM Given:    Additional Medicare Important Message give by:     If discussed at [place] of Stay Meetings, dates discussed:    Additional Comments:  Carles Collet, RN 05/29/2015, 10:17 AM

## 2015-05-29 NOTE — Care Management Important Message (Signed)
Important Message  Patient Details  Name: Benjamin Lawson MRN: [id number] Date of Birth: 09/11/1932   Medicare Important Message Given:  Yes-third notification given    [name] May 05/29/2015, 12:28 PM

## 2015-05-29 NOTE — Clinical Social Work Placement (Signed)
   CLINICAL SOCIAL WORK PLACEMENT  NOTE  Date:  05/29/2015  Patient Details  Name: Benjamin Lawson MRN: [id number] Date of Birth: 11-09-1932  Clinical Social Work is seeking post-discharge placement for this patient at the [place] level of care (*CSW will initial, date and re-position this form in  chart as items are completed):  Yes   Patient/family provided with [facility]'s list of facilities offering this level of care within the geographic area requested by the patient (or if unable, by the patient's family).  Yes   Patient/family informed of their freedom to choose among providers that offer the needed level of care, that participate in Medicare, Medicaid or managed care program needed by the patient, have an available bed and are willing to accept the patient.  Yes   Patient/family informed of 's ownership interest in [facility] and [facility], as well as of the fact that they are under no obligation to receive care at these facilities.  PASRR submitted to EDS on 05/28/15     PASRR number received on       Existing PASRR number confirmed on 05/28/15     FL2 transmitted to all facilities in geographic area requested by pt/family on 05/28/15     FL2 transmitted to all facilities within larger geographic area on       Patient informed that his/her managed care company has contracts with or will negotiate with certain facilities, including the following:        Yes   Patient/family informed of bed offers received.  Patient chooses bed at [facility]     Physician recommends and patient chooses bed at      Patient to be transferred to [facility] on 05/29/15.  Patient to be transferred to facility by Ambulance     Patient family notified on 05/29/15 of transfer.  Name of family member notified:  Darlene at bedside. Darlene requests transport for 6:00PM.     PHYSICIAN Please prepare  priority discharge summary, including medications, Please prepare prescriptions, Please sign FL2, Please sign DNR     Additional Comment:   Per MD patient ready for DC to [facility]. RN, patient, patient's family, and facility notified of DC. RN given number for report. DC packet on chart. Ambulance transport will be requested by RN (number on packet). CSW signing off. _______________________________________________ Liz Beach MSW, [place], [place], [phone]

## 2015-06-03 ENCOUNTER — Ambulatory Visit (HOSPITAL_BASED_OUTPATIENT_CLINIC_OR_DEPARTMENT_OTHER): Payer: Medicare Other | Admitting: Nurse Practitioner

## 2015-06-03 ENCOUNTER — Other Ambulatory Visit (HOSPITAL_BASED_OUTPATIENT_CLINIC_OR_DEPARTMENT_OTHER): Payer: Medicare Other

## 2015-06-03 ENCOUNTER — Encounter: Payer: Self-pay | Admitting: *Deleted

## 2015-06-03 ENCOUNTER — Ambulatory Visit: Payer: Medicare Other

## 2015-06-03 ENCOUNTER — Other Ambulatory Visit: Payer: Medicare Other

## 2015-06-03 ENCOUNTER — Telehealth: Payer: Self-pay | Admitting: Nurse Practitioner

## 2015-06-03 VITALS — BP 105/74 | HR 112 | Resp 18 | Ht 71.0 in | Wt 158.7 lb

## 2015-06-03 DIAGNOSIS — K922 Gastrointestinal hemorrhage, unspecified: Secondary | ICD-10-CM | POA: Diagnosis not present

## 2015-06-03 DIAGNOSIS — R630 Anorexia: Secondary | ICD-10-CM | POA: Diagnosis not present

## 2015-06-03 DIAGNOSIS — C787 Secondary malignant neoplasm of liver and intrahepatic bile duct: Secondary | ICD-10-CM | POA: Diagnosis not present

## 2015-06-03 DIAGNOSIS — C169 Malignant neoplasm of stomach, unspecified: Secondary | ICD-10-CM

## 2015-06-03 DIAGNOSIS — C160 Malignant neoplasm of cardia: Secondary | ICD-10-CM

## 2015-06-03 DIAGNOSIS — Z95828 Presence of other vascular implants and grafts: Secondary | ICD-10-CM

## 2015-06-03 DIAGNOSIS — I2699 Other pulmonary embolism without acute cor pulmonale: Secondary | ICD-10-CM | POA: Diagnosis not present

## 2015-06-03 DIAGNOSIS — N289 Disorder of kidney and ureter, unspecified: Secondary | ICD-10-CM

## 2015-06-03 DIAGNOSIS — I82402 Acute embolism and thrombosis of unspecified deep veins of left lower extremity: Secondary | ICD-10-CM

## 2015-06-03 DIAGNOSIS — R634 Abnormal weight loss: Secondary | ICD-10-CM

## 2015-06-03 DIAGNOSIS — D649 Anemia, unspecified: Secondary | ICD-10-CM | POA: Diagnosis not present

## 2015-06-03 LAB — CBC WITH DIFFERENTIAL/PLATELET
BASO%: 0.6 % (ref 0.0–2.0)
Basophils Absolute: 0 10*3/uL (ref 0.0–0.1)
EOS%: 0 % (ref 0.0–7.0)
Eosinophils Absolute: 0 10*3/uL (ref 0.0–0.5)
HCT: 27.8 % — ABNORMAL LOW (ref 38.4–49.9)
HGB: 9 g/dL — ABNORMAL LOW (ref 13.0–17.1)
LYMPH%: 22.6 % (ref 14.0–49.0)
MCH: 28 pg (ref 27.2–33.4)
MCHC: 32.5 g/dL (ref 32.0–36.0)
MCV: 86.2 fL (ref 79.3–98.0)
MONO#: 0.5 10*3/uL (ref 0.1–0.9)
MONO%: 10.6 % (ref 0.0–14.0)
NEUT#: 2.8 10*3/uL (ref 1.5–6.5)
NEUT%: 66.2 % (ref 39.0–75.0)
Platelets: 167 10*3/uL (ref 140–400)
RBC: 3.22 10*6/uL — ABNORMAL LOW (ref 4.20–5.82)
RDW: 20.2 % — ABNORMAL HIGH (ref 11.0–14.6)
WBC: 4.3 10*3/uL (ref 4.0–10.3)
lymph#: 1 10*3/uL (ref 0.9–3.3)

## 2015-06-03 LAB — TECHNOLOGIST REVIEW

## 2015-06-03 LAB — BASIC METABOLIC PANEL (CC13)
Anion Gap: 10 meq/L (ref 3–11)
BUN: 16.1 mg/dL (ref 7.0–26.0)
CO2: 17 meq/L — ABNORMAL LOW (ref 22–29)
Calcium: 8.7 mg/dL (ref 8.4–10.4)
Chloride: 125 meq/L — ABNORMAL HIGH (ref 98–109)
Creatinine: 1.3 mg/dL (ref 0.7–1.3)
EGFR: 58 ml/min/1.73 m2 — ABNORMAL LOW
Glucose: 95 mg/dL (ref 70–140)
Potassium: 4.8 meq/L (ref 3.5–5.1)
Sodium: 152 meq/L — ABNORMAL HIGH (ref 136–145)

## 2015-06-03 MED ORDER — HEPARIN SOD (PORK) LOCK FLUSH 100 UNIT/ML IV SOLN
500.0000 [IU] | Freq: Once | INTRAVENOUS | Status: DC
  Filled 2015-06-03: qty 5

## 2015-06-03 MED ORDER — SODIUM CHLORIDE 0.9 % IJ SOLN
10.0000 mL | INTRAMUSCULAR | Status: DC | PRN
  Administered 2015-06-03: 10 mL via INTRAVENOUS
  Filled 2015-06-03: qty 10

## 2015-06-03 MED ORDER — HEPARIN SOD (PORK) LOCK FLUSH 100 UNIT/ML IV SOLN
500.0000 [IU] | Freq: Once | INTRAVENOUS | Status: AC
  Administered 2015-06-03: 500 [IU] via INTRAVENOUS
  Filled 2015-06-03: qty 5

## 2015-06-03 NOTE — Progress Notes (Signed)
Oncology Nurse Navigator Documentation  Oncology Nurse Navigator Flowsheets 06/03/2015  Navigator Encounter Type Hospital F/U  Patient Visit Type Medonc  Treatment Phase Comfort care/palliative  Barriers/Navigation Needs Family concerns-Home care after his rehab stay  Interventions None required at this time  Time Spent with Patient 75  His son is working on [employer] forms. Instructed sister to have him bring forms here to be completed. Will refer to Hospice as he gets closer to discharge from SNF. Will try Megace to stimulate appetite. Continue PT/OT.

## 2015-06-03 NOTE — Telephone Encounter (Signed)
per pof to sch pt appt-gave pt copy of avs °

## 2015-06-03 NOTE — Progress Notes (Addendum)
Benjamin OFFICE PROGRESS NOTE   Diagnosis:  Gastric Lawson  INTERVAL HISTORY:   Benjamin Lawson returns as scheduled. He was admitted 05/23/2015 with shortness of breath. He was found to have bilateral pulmonary emboli. He was also found to have a left lower extremity DVT. He was initially anticoagulated with a heparin drip. IVC filter was placed on 05/26/2015. He was subsequently transitioned to full dose Lovenox. He was discharged to a nursing facility on 05/29/2015.  His appetite is poor. He denies nausea/vomiting. No diarrhea. No dysphagia. He denies any bleeding. No bloody or black stools. He denies shortness of breath. His daughter reports he is "week and off balance". He is working with physical therapy and occupational therapy.  Objective:  Vital signs in last 24 hours:  Blood pressure 105/74, pulse 112, resp. rate 18, height 5\' 11"  (1.803 m), weight 158 lb 11.2 oz (71.986 kg), SpO2 99 %.    HEENT: No thrush. Lymphatics: No palpable cervical or supraclavicular lymph nodes. Resp: Lungs clear bilaterally. No respiratory distress. Cardio: Regular, tachycardic. GI: Abdomen soft and nontender. No hepatomegaly. Vascular: Trace lower leg/ankle edema bilaterally left greater than right. Calves soft and nontender. Neuro: Alert. Follows commands. . Port-A-Cath without erythema.    Lab Results:  Lab Results  Component Value Date   WBC 4.3 06/03/2015   HGB 9.0* 06/03/2015   HCT 27.8* 06/03/2015   MCV 86.2 06/03/2015   PLT 167 06/03/2015   NEUTROABS 2.8 06/03/2015    Imaging:  No results found.  Medications: I have reviewed the patient's current medications.  Assessment/Plan: 1. Gastric Lawson; EGD on 03/28/2015 which showed a near circumferential mass in the cardia. Biopsy showed adenocarcinoma. Staging CT scans showed diffuse wall thickening of the stomach; small nodular areas of soft tissue seen adjacent to the stomach; small hypoattenuating liver  lesions.   Initiation of radiation 04/09/2015.   Initiation of weekly Taxol/carboplatin 04/17/2015.   Week 2 Taxol/carboplatin 04/24/2015.   Week 3 Taxol/carboplatin held due to neutropenia.   Week 4 Taxol/carboplatin 05/08/2015.   Week 5 held on 05/15/2015 due to neutropenia.   Radiation completed 05/20/2015. 2. GI bleeding secondary to #1 3. Anemia secondary to GI bleeding 4. Renal insufficiency 5. Left lower extremity DVT and bilateral pulmonary embolism 05/24/2015. Initially placed on a heparin drip. IVC filter placed 05/26/2015. Transitioned to full dose Lovenox.   Disposition: Benjamin Lawson appears unchanged. He has a poor performance status.   He was recently found to have bilateral pulmonary emboli and a left lower extremity DVT. He is currently maintained on full dose Lovenox. He has an IVC filter.  Due to his risk of bleeding, Dr. Benay Spice recommends changing the Lovenox dose to 40 mg subcutaneous daily beginning 06/09/2015.  For the anorexia/weight loss we recommend he begin Megace 200 mg twice daily.  We discussed hospice with Benjamin Lawson and his daughter at today's visit. We will have additional discussion at the time of his next visit and make a decision on a referral pending his clinical status at that time.  He will return for a follow-up visit on 06/30/2015. He or his daughter will contact the office in the interim with any problems.  Patient seen with Dr. Benay Spice.    Ned Card ANP/GNP-BC   06/03/2015  3:32 PM  This was a shared visit with Ned Card. Benjamin Lawson has an improved performance status compared to the recent hospital admission, but he continues to have anorexia. We discussed the current situation and hospice care  with Benjamin Lawson and his daughter. He will continue physical therapy at the skilled nursing facility and return for an office visit in approximately 3 weeks. We recommended he continue full dose anticoagulation until  06/09/2015. He will begin a trial of Megace.  Julieanne Manson, M.D.

## 2015-06-03 NOTE — Patient Instructions (Signed)

## 2015-06-05 NOTE — Progress Notes (Signed)
  Radiation Oncology         (336) [phone] ________________________________  Name: Benjamin Lawson MRN: [id number]  Date: 05/20/2015  DOB: 01-30-1933  End of Treatment Note  Diagnosis:   Gastric cancer     Indication for treatment::  curative       Radiation treatment dates:   04/09/2015 through 05/20/2015  Site/dose:   The patient was treated to the gastric region initially to a dose of 45 gray in 25 fractions using a 5 field 3-D conformal technique. The patient then received a cone down boost for an additional 5.4 gray, also using a 5 field technique.  Narrative: The patient tolerated radiation treatment relatively well.     Plan: The patient has completed radiation treatment. The patient will return to radiation oncology clinic for routine followup in one month. I advised the patient to call or return sooner if they have any questions or concerns related to their recovery or treatment. ________________________________  Jodelle Gross, M.D., Ph.D.

## 2015-06-05 NOTE — Progress Notes (Signed)
  Radiation Oncology         (336) [phone] ________________________________  Name: Benjamin Lawson MRN: [id number]  Date: 05/14/2015  DOB: 1933/06/09  COMPLEX SIMULATION  NOTE  Diagnosis: Gastric cancer  Narrative The patient has initially been planned to receive a course of radiation treatment to a dose of 45 gray in 25 fractions at 1.8 gray per fraction. The patient will now receive a boost to the high risk target volume for an additional 5.4 gray. This will be delivered in 3 fractions at 1.8 gray per fraction and a cone down boost technique will be utilized. To accomplish this, an additional 5 customized blocks have been designed for this purpose. A complex isodose plan is requested to ensure that the high-risk target region receives the appropriate radiation dose and that the nearby normal structures continue to be appropriately spared. The patient's final total dose therefore will be 50.4 gray.   ________________________________ ------------------------------------------------  Jodelle Gross, MD, PhD

## 2015-06-19 ENCOUNTER — Ambulatory Visit: Payer: Self-pay | Admitting: Radiation Oncology

## 2015-06-25 ENCOUNTER — Telehealth: Payer: Self-pay | Admitting: *Deleted

## 2015-06-25 NOTE — Telephone Encounter (Signed)
Patient's daughter called to say patient is going under hospice care. Is canceling appts for Monday with Dr Benay Spice

## 2015-06-25 NOTE — Telephone Encounter (Signed)
Hillman Attig called to check on FMLA papers her dropped off on Monday from the [employer]. He can be reached at [phone]. Message left for [employer].

## 2015-06-27 ENCOUNTER — Encounter: Payer: Self-pay | Admitting: Oncology

## 2015-06-27 NOTE — Progress Notes (Signed)
I called and left son-Joie message of forms faxed to  [phone] and mailing copy for their records to address on file.

## 2015-06-27 NOTE — Progress Notes (Signed)
I placed fmla form on desk of nurse for dr. Benay Spice

## 2015-06-30 ENCOUNTER — Telehealth: Payer: Self-pay | Admitting: *Deleted

## 2015-06-30 ENCOUNTER — Other Ambulatory Visit: Payer: Medicare Other

## 2015-06-30 ENCOUNTER — Ambulatory Visit: Payer: Medicare Other | Admitting: Oncology

## 2015-06-30 NOTE — Telephone Encounter (Signed)
Received call from pt's daughter, Carlyon Shadow, stating pt passed away last night. POF entered to cancel all appts.

## 2015-07-01 DEATH — deceased

## 2015-08-27 ENCOUNTER — Other Ambulatory Visit: Payer: Self-pay | Admitting: Nurse Practitioner

## 2015-12-31 ENCOUNTER — Encounter: Payer: Self-pay | Admitting: Oncology

## 2015-12-31 NOTE — Progress Notes (Signed)
Faxed sent 06/27/15 I sent to medical records

## 2017-03-15 IMAGING — DX DG CHEST 1V PORT
1 series · 1 of 1 positions shown · non-contrast
Comparison: None.

CLINICAL DATA: Weakness, fatigue, shortness of breath for 2 days.
Ex-smoker

EXAM:
PORTABLE CHEST - 1 VIEW

[chest ap]
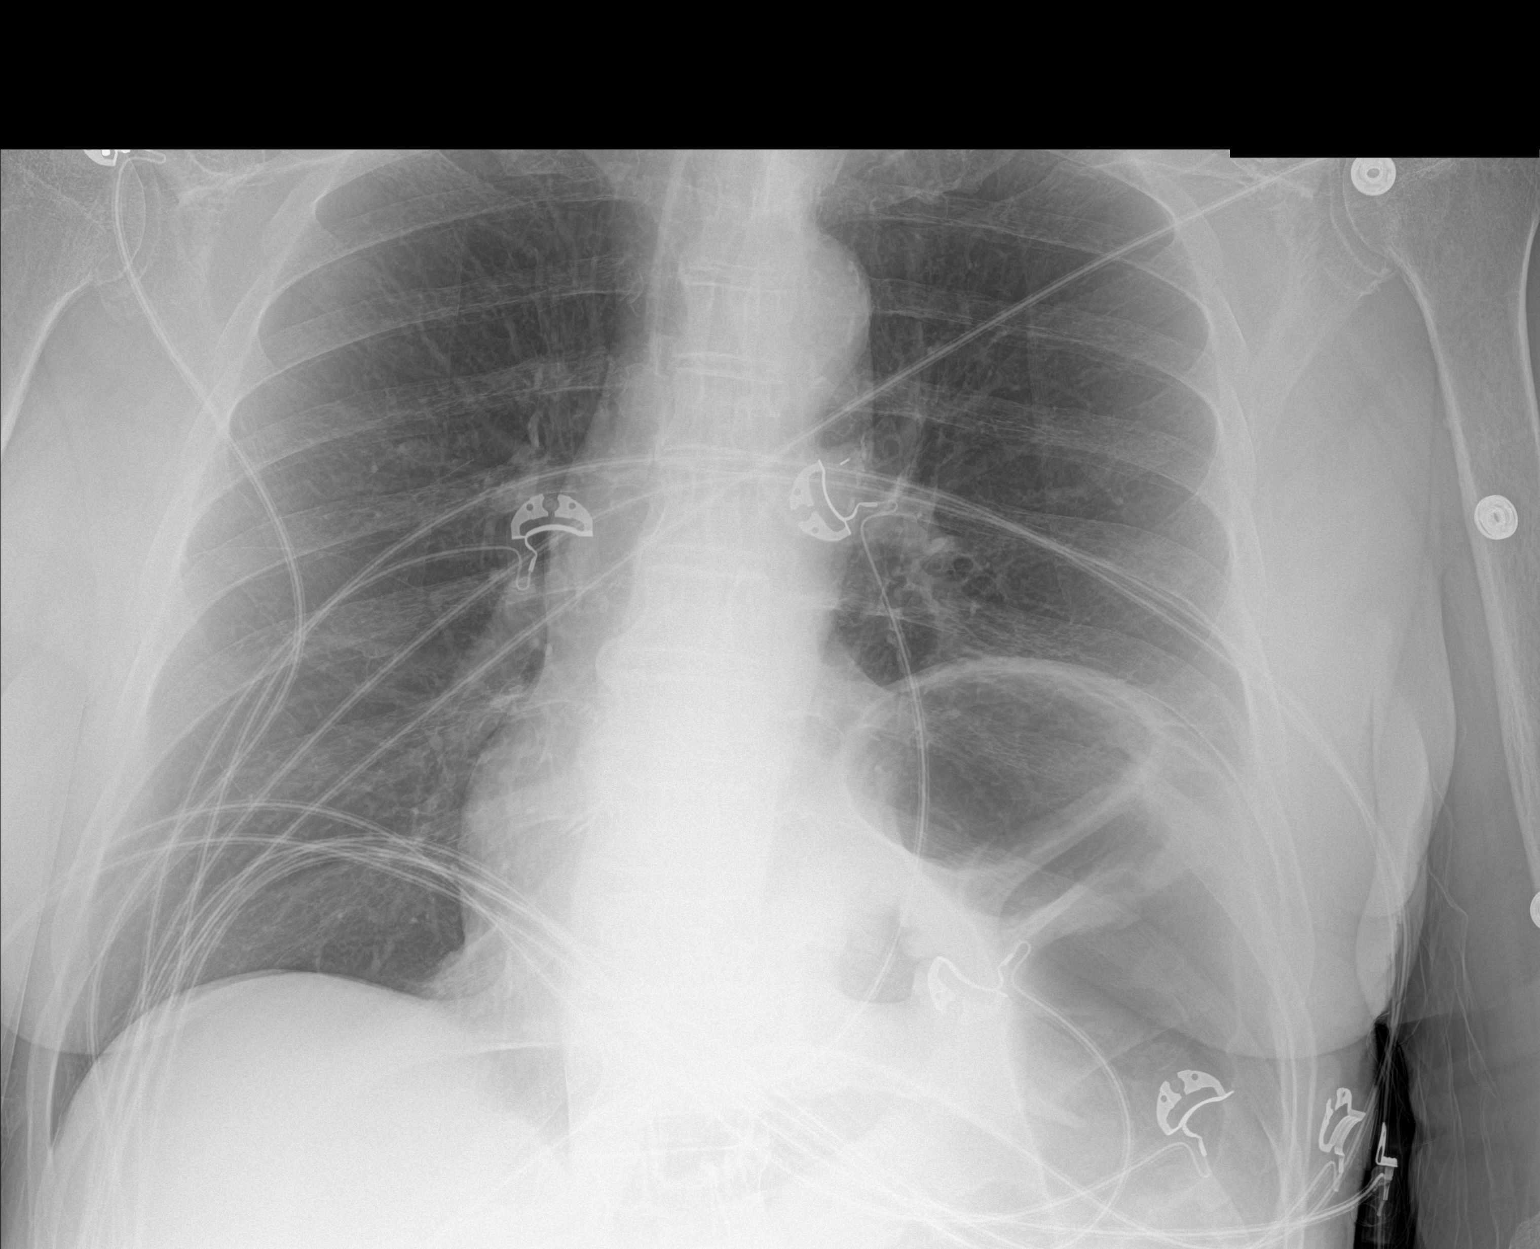

[1 of 1 positions shown; findings below may reference images not displayed]

FINDINGS: There is marked elevation of the left hemidiaphragm, of uncertain
acuity. Lungs appear to be at least mildly hyperexpanded suggesting
some degree of COPD. Lungs appear clear. No evidence of pneumonia.
No pleural effusion. No pneumothorax.

Osseous structures are unremarkable. Cardiomediastinal silhouette
appears normal in size and configuration, although a portion of the
left heart border is obscured by the left hemidiaphragm elevation.
IMPRESSION: Lungs appear hyperexpanded suggesting some degree of underlying
COPD.

Lungs are clear.  No evidence of acute cardiopulmonary abnormality.

Marked elevation of the left hemidiaphragm, of uncertain acuity but
probably chronic.

## 2017-03-21 IMAGING — NM NM PULMONARY VENT & PERF
12 series · 12 of 12 positions shown · non-contrast
Comparison: Chest radiograph April 02, 2015

CLINICAL DATA: Shortness of Breath

[Series 1: ant/post vent · 4.14mm/px · 1 of 1 slices shown (1 of 2)]
[im 1/1]
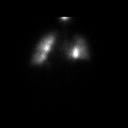

[Series 1: ant/post vent · 4.14mm/px · 1 of 1 slices shown (2 of 2)]
[im 1/1]
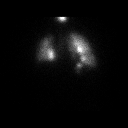

[Series 2: lao/rpo vent · 4.14mm/px · 1 of 1 slices shown (1 of 2)]
[im 1/1]
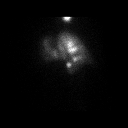

[Series 2: lao/rpo vent · 4.14mm/px · 1 of 1 slices shown (2 of 2)]
[im 1/1]
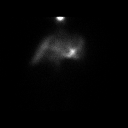

[Series 3: lpo/rao vent · 4.14mm/px · 1 of 1 slices shown (1 of 2)]
[im 1/1]
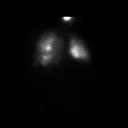

[Series 3: lpo/rao vent · 4.14mm/px · 1 of 1 slices shown (2 of 2)]
[im 1/1]
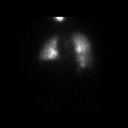

[Series 4: lpo/rao perf · 4.14mm/px · 1 of 1 slices shown (1 of 2)]
[im 1/1]
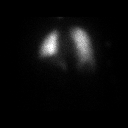

[Series 4: lpo/rao perf · 4.14mm/px · 1 of 1 slices shown (2 of 2)]
[im 1/1]
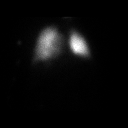

[Series 5: ant/post perf · 4.14mm/px · 1 of 1 slices shown (1 of 2)]
[im 1/1]
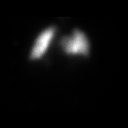

[Series 5: ant/post perf · 4.14mm/px · 1 of 1 slices shown (2 of 2)]
[im 1/1]
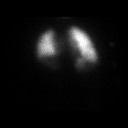

[Series 6: lao/rpo perf · 4.14mm/px · 1 of 1 slices shown (1 of 2)]
[im 1/1]
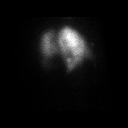

[Series 6: lao/rpo perf · 4.14mm/px · 1 of 1 slices shown (2 of 2)]
[im 1/1]
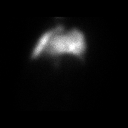

[12 of 12 positions shown; findings below may reference images not displayed]

EXAM:
NUCLEAR MEDICINE VENTILATION - PERFUSION LUNG SCAN

Views: Anterior, posterior, LPO, RPO, LAO, RAO-ventilation and
perfusion

Radionuclide: Technetium 99 m DTPA-ventilation; Technetium 99m
macroaggregated albumin-perfusion

Dose:  40.0 mCi-ventilation; 6.0 mCi-perfusion

Route of administration: Inhalation -ventilation ; intravenous
-perfusion
FINDINGS: Ventilation: Radiotracer uptake is homogeneous and symmetric
bilaterally. There is elevation of the left hemidiaphragm. No focal
ventilation defects are identified.

Perfusion: There is elevation of the left hemidiaphragm. Radiotracer
uptake is homogeneous and symmetric bilaterally. There are no
appreciable perfusion defects.
IMPRESSION: No appreciable ventilation or perfusion defects. This study
constitutes a very low probability of pulmonary embolus.

## 2017-04-11 IMAGING — US IR US GUIDE VASC ACCESS RIGHT
1 series · 2 of 2 positions shown · non-contrast
Comparison: None.

INDICATION: History of gastric cancer. In in need of durable intravenous access
for chemotherapy administration.

[Series 1: ir fluoro/shunt/fist · 2 of 2 slices shown]
[im 1/2]
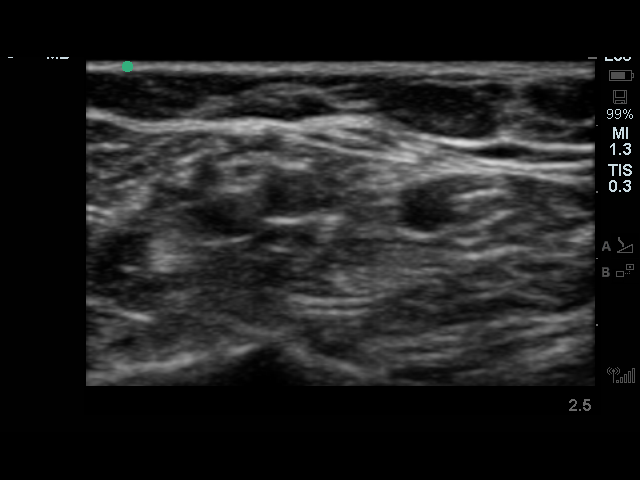
[im 2/2]
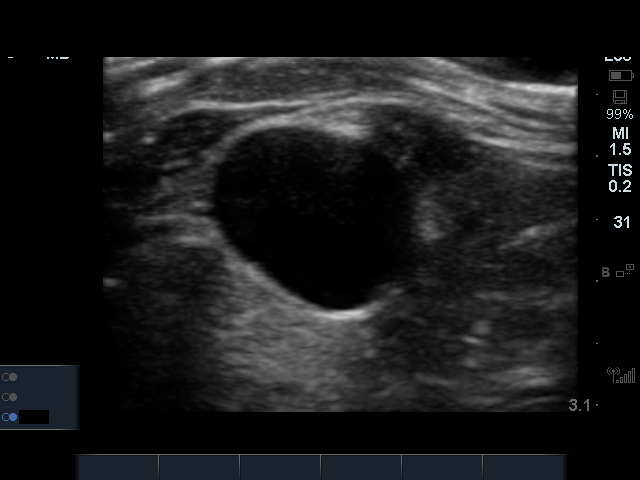

[2 of 2 positions shown; findings below may reference images not displayed]

Given patient's poor intravenous access, request has been made for
placement of a ultrasound-guided micropuncture sheath for
intravenous access for sedation purposes during the implantation of
the Port a Catheter.

EXAM:
1. ULTRASOUND-GUIDED VENIPUNCTURE BY LOCKLEAR
2. IMPLANTED PORT A CATH PLACEMENT WITH ULTRASOUND AND FLUOROSCOPIC
GUIDANCE
MEDICATIONS:
Ancef 2 gm IV; The antibiotic was administered within an appropriate
time interval prior to skin puncture.

ANESTHESIA/SEDATION:
Versed 1 mg IV; Fentanyl 25 mcg IV;

Total Moderate Sedation Time

24  minutes.

CONTRAST:  None

FLUOROSCOPY TIME:  18 seconds (4 mGy)

COMPLICATIONS:
None immediate

PROCEDURE:
The procedure, risks, benefits, and alternatives were explained to
the patient. Questions regarding the procedure were encouraged and
answered. The patient understands and consents to the procedure.

The right upper arm was prepped with chlorhexidine in a sterile
fashion, and a sterile drape was applied covering the operative
field. Local anesthesia was provided with 1% lidocaine.

Under direct ultrasound guidance, the right brachial vein was
accessed with a micropuncture kit after the overlying soft tissues
were anesthetized with 1% lidocaine. An ultrasound image was saved
for documentation purposes. The micropuncture sheath easily
aspirated and flushed and was secured in place. A dressing was
placed.

Attention was now paid towards placement of the Port a Catheter. The
right neck and chest were prepped with chlorhexidine in a sterile
fashion, and a sterile drape was applied covering the operative
field. Maximum barrier sterile technique with sterile gowns and
gloves were used for the procedure. A timeout was performed prior to
the initiation of the procedure. Local anesthesia was provided with
1% lidocaine with epinephrine.

After creating a small venotomy incision, a micropuncture kit was
utilized to access the internal jugular vein under direct, real-time
ultrasound guidance. Ultrasound image documentation was performed.
The microwire was kinked to measure appropriate catheter length.

A subcutaneous port pocket was then created along the upper chest
wall utilizing a combination of sharp and blunt dissection. The
pocket was irrigated with sterile saline. A single lumen thin power
injectable port was chosen for placement. The 8 Fr catheter was
tunneled from the port pocket site to the venotomy incision. The
port was placed in the pocket. The external catheter was trimmed to
appropriate length. At the venotomy, an 8 Fr peel-away sheath was
placed over a guidewire under fluoroscopic guidance. The catheter
was then placed through the sheath and the sheath was removed. Final
catheter positioning was confirmed and documented with a
fluoroscopic spot radiograph. The port was accessed with Shio Canelo
needle, aspirated and flushed with heparinized saline.

The venotomy site was closed with an interrupted 4-0 Vicryl suture.
The port pocket incision was closed with interrupted 2-0 Vicryl
suture and the skin was opposed with a running subcuticular 4-0
Vicryl suture. Dermabond and Pellegrini were applied to both
incisions. Dressings were placed. The patient tolerated the
procedure well without immediate post procedural complication.
FINDINGS: After catheter placement, the tip lies within the superior
cavoatrial junction. The catheter aspirates and flushes normally and
is ready for immediate use.
IMPRESSION: 1. Successful ultrasound placement of a micropuncture sheath for
temporary intravenous access for sedation purposes during the Port a
Catheter implantation.
2. Successful placement of a right internal jugular approach power
injectable Port-A-Cath. The catheter is ready for immediate use.
